# Patient Record
Sex: Female | Born: 1955 | Race: White | Hispanic: No | Smoking: Never smoker
Health system: Southern US, Community
[De-identification: ages and names within clinical notes are randomized; demographics above are authoritative.]

## PROBLEM LIST (undated history)

## (undated) DIAGNOSIS — C569 Malignant neoplasm of unspecified ovary: Secondary | ICD-10-CM

---

## 2016-04-25 ENCOUNTER — Encounter (HOSPITAL_COMMUNITY): Payer: Self-pay

## 2016-04-25 ENCOUNTER — Inpatient Hospital Stay (HOSPITAL_COMMUNITY)
Admission: EM | Admit: 2016-04-25 | Discharge: 2016-05-06 | DRG: 871 | Disposition: A | Discharge status: Skilled Nursing Facility | Payer: Medicaid Other | Attending: Internal Medicine | Admitting: Internal Medicine

## 2016-04-25 ENCOUNTER — Emergency Department (HOSPITAL_COMMUNITY): Payer: Medicaid Other

## 2016-04-25 DIAGNOSIS — R072 Precordial pain: Secondary | ICD-10-CM | POA: Diagnosis not present

## 2016-04-25 DIAGNOSIS — K7011 Alcoholic hepatitis with ascites: Secondary | ICD-10-CM | POA: Diagnosis not present

## 2016-04-25 DIAGNOSIS — D75839 Thrombocytosis, unspecified: Secondary | ICD-10-CM | POA: Diagnosis present

## 2016-04-25 DIAGNOSIS — E43 Unspecified severe protein-calorie malnutrition: Secondary | ICD-10-CM | POA: Diagnosis present

## 2016-04-25 DIAGNOSIS — E871 Hypo-osmolality and hyponatremia: Secondary | ICD-10-CM

## 2016-04-25 DIAGNOSIS — Z888 Allergy status to other drugs, medicaments and biological substances status: Secondary | ICD-10-CM

## 2016-04-25 DIAGNOSIS — J90 Pleural effusion, not elsewhere classified: Secondary | ICD-10-CM | POA: Diagnosis present

## 2016-04-25 DIAGNOSIS — C561 Malignant neoplasm of right ovary: Secondary | ICD-10-CM | POA: Diagnosis not present

## 2016-04-25 DIAGNOSIS — I248 Other forms of acute ischemic heart disease: Secondary | ICD-10-CM | POA: Diagnosis present

## 2016-04-25 DIAGNOSIS — R000 Tachycardia, unspecified: Secondary | ICD-10-CM

## 2016-04-25 DIAGNOSIS — Z833 Family history of diabetes mellitus: Secondary | ICD-10-CM

## 2016-04-25 DIAGNOSIS — D638 Anemia in other chronic diseases classified elsewhere: Secondary | ICD-10-CM | POA: Diagnosis present

## 2016-04-25 DIAGNOSIS — E440 Moderate protein-calorie malnutrition: Secondary | ICD-10-CM | POA: Insufficient documentation

## 2016-04-25 DIAGNOSIS — K652 Spontaneous bacterial peritonitis: Secondary | ICD-10-CM | POA: Diagnosis present

## 2016-04-25 DIAGNOSIS — I272 Pulmonary hypertension, unspecified: Secondary | ICD-10-CM | POA: Diagnosis present

## 2016-04-25 DIAGNOSIS — R0602 Shortness of breath: Secondary | ICD-10-CM

## 2016-04-25 DIAGNOSIS — E861 Hypovolemia: Secondary | ICD-10-CM | POA: Diagnosis present

## 2016-04-25 DIAGNOSIS — Z8249 Family history of ischemic heart disease and other diseases of the circulatory system: Secondary | ICD-10-CM

## 2016-04-25 DIAGNOSIS — I2510 Atherosclerotic heart disease of native coronary artery without angina pectoris: Secondary | ICD-10-CM | POA: Diagnosis present

## 2016-04-25 DIAGNOSIS — A4101 Sepsis due to Methicillin susceptible Staphylococcus aureus: Principal | ICD-10-CM | POA: Diagnosis present

## 2016-04-25 DIAGNOSIS — C569 Malignant neoplasm of unspecified ovary: Secondary | ICD-10-CM | POA: Diagnosis present

## 2016-04-25 DIAGNOSIS — C786 Secondary malignant neoplasm of retroperitoneum and peritoneum: Secondary | ICD-10-CM | POA: Diagnosis present

## 2016-04-25 DIAGNOSIS — D473 Essential (hemorrhagic) thrombocythemia: Secondary | ICD-10-CM | POA: Diagnosis present

## 2016-04-25 DIAGNOSIS — A412 Sepsis due to unspecified staphylococcus: Secondary | ICD-10-CM | POA: Diagnosis not present

## 2016-04-25 DIAGNOSIS — I471 Supraventricular tachycardia: Secondary | ICD-10-CM | POA: Diagnosis present

## 2016-04-25 DIAGNOSIS — K659 Peritonitis, unspecified: Secondary | ICD-10-CM | POA: Diagnosis not present

## 2016-04-25 DIAGNOSIS — R180 Malignant ascites: Secondary | ICD-10-CM | POA: Diagnosis present

## 2016-04-25 DIAGNOSIS — R188 Other ascites: Secondary | ICD-10-CM | POA: Diagnosis not present

## 2016-04-25 DIAGNOSIS — D509 Iron deficiency anemia, unspecified: Secondary | ICD-10-CM | POA: Diagnosis present

## 2016-04-25 DIAGNOSIS — R1900 Intra-abdominal and pelvic swelling, mass and lump, unspecified site: Secondary | ICD-10-CM | POA: Diagnosis not present

## 2016-04-25 DIAGNOSIS — N179 Acute kidney failure, unspecified: Secondary | ICD-10-CM | POA: Diagnosis present

## 2016-04-25 DIAGNOSIS — R609 Edema, unspecified: Secondary | ICD-10-CM

## 2016-04-25 DIAGNOSIS — Z803 Family history of malignant neoplasm of breast: Secondary | ICD-10-CM

## 2016-04-25 DIAGNOSIS — R971 Elevated cancer antigen 125 [CA 125]: Secondary | ICD-10-CM | POA: Diagnosis not present

## 2016-04-25 DIAGNOSIS — R7881 Bacteremia: Secondary | ICD-10-CM | POA: Diagnosis not present

## 2016-04-25 DIAGNOSIS — I5033 Acute on chronic diastolic (congestive) heart failure: Secondary | ICD-10-CM | POA: Diagnosis present

## 2016-04-25 DIAGNOSIS — E538 Deficiency of other specified B group vitamins: Secondary | ICD-10-CM | POA: Diagnosis present

## 2016-04-25 DIAGNOSIS — I4891 Unspecified atrial fibrillation: Secondary | ICD-10-CM | POA: Diagnosis not present

## 2016-04-25 DIAGNOSIS — Z780 Asymptomatic menopausal state: Secondary | ICD-10-CM

## 2016-04-25 DIAGNOSIS — R220 Localized swelling, mass and lump, head: Secondary | ICD-10-CM | POA: Diagnosis present

## 2016-04-25 DIAGNOSIS — A419 Sepsis, unspecified organism: Secondary | ICD-10-CM | POA: Diagnosis not present

## 2016-04-25 DIAGNOSIS — I480 Paroxysmal atrial fibrillation: Secondary | ICD-10-CM | POA: Diagnosis present

## 2016-04-25 DIAGNOSIS — R7989 Other specified abnormal findings of blood chemistry: Secondary | ICD-10-CM | POA: Diagnosis present

## 2016-04-25 DIAGNOSIS — E46 Unspecified protein-calorie malnutrition: Secondary | ICD-10-CM | POA: Diagnosis not present

## 2016-04-25 DIAGNOSIS — R1907 Generalized intra-abdominal and pelvic swelling, mass and lump: Secondary | ICD-10-CM | POA: Diagnosis not present

## 2016-04-25 DIAGNOSIS — C801 Malignant (primary) neoplasm, unspecified: Secondary | ICD-10-CM | POA: Diagnosis not present

## 2016-04-25 DIAGNOSIS — D649 Anemia, unspecified: Secondary | ICD-10-CM | POA: Diagnosis not present

## 2016-04-25 HISTORY — DX: Malignant neoplasm of unspecified ovary: C56.9

## 2016-04-25 LAB — CBC WITH DIFFERENTIAL/PLATELET
BAND NEUTROPHILS: 22 %
BLASTS: 0 %
Basophils Absolute: 0 10*3/uL (ref 0.0–0.1)
Basophils Relative: 0 %
Eosinophils Absolute: 0 10*3/uL (ref 0.0–0.7)
Eosinophils Relative: 0 %
HCT: 30.1 % — ABNORMAL LOW (ref 36.0–46.0)
HEMOGLOBIN: 8.2 g/dL — AB (ref 12.0–15.0)
LYMPHS ABS: 1.4 10*3/uL (ref 0.7–4.0)
Lymphocytes Relative: 7 %
MCH: 17.9 pg — ABNORMAL LOW (ref 26.0–34.0)
MCHC: 27.2 g/dL — AB (ref 30.0–36.0)
MCV: 65.7 fL — ABNORMAL LOW (ref 78.0–100.0)
METAMYELOCYTES PCT: 0 %
MONO ABS: 0.4 10*3/uL (ref 0.1–1.0)
MONOS PCT: 2 %
Myelocytes: 0 %
Neutro Abs: 17.6 10*3/uL — ABNORMAL HIGH (ref 1.7–7.7)
Neutrophils Relative %: 69 %
Other: 0 %
PLATELETS: 616 10*3/uL — AB (ref 150–400)
PROMYELOCYTES ABS: 0 %
RBC: 4.58 MIL/uL (ref 3.87–5.11)
RDW: 20.6 % — ABNORMAL HIGH (ref 11.5–15.5)
WBC MORPHOLOGY: INCREASED
WBC: 19.4 10*3/uL — ABNORMAL HIGH (ref 4.0–10.5)
nRBC: 0 /100 WBC

## 2016-04-25 LAB — I-STAT CHEM 8, ED
BUN: 9 mg/dL (ref 6–20)
Calcium, Ion: 1.07 mmol/L — ABNORMAL LOW (ref 1.15–1.40)
Chloride: 98 mmol/L — ABNORMAL LOW (ref 101–111)
Creatinine, Ser: 0.7 mg/dL (ref 0.44–1.00)
Glucose, Bld: 133 mg/dL — ABNORMAL HIGH (ref 65–99)
HCT: 30 % — ABNORMAL LOW (ref 36.0–46.0)
Hemoglobin: 10.2 g/dL — ABNORMAL LOW (ref 12.0–15.0)
Potassium: 4.2 mmol/L (ref 3.5–5.1)
SODIUM: 134 mmol/L — AB (ref 135–145)
TCO2: 26 mmol/L (ref 0–100)

## 2016-04-25 LAB — COMPREHENSIVE METABOLIC PANEL
ALBUMIN: 2 g/dL — AB (ref 3.5–5.0)
ALK PHOS: 91 U/L (ref 38–126)
ALT: 12 U/L — AB (ref 14–54)
AST: 24 U/L (ref 15–41)
Anion gap: 13 (ref 5–15)
BILIRUBIN TOTAL: 1.1 mg/dL (ref 0.3–1.2)
BUN: 8 mg/dL (ref 6–20)
CALCIUM: 7.9 mg/dL — AB (ref 8.9–10.3)
CO2: 22 mmol/L (ref 22–32)
Chloride: 99 mmol/L — ABNORMAL LOW (ref 101–111)
Creatinine, Ser: 0.71 mg/dL (ref 0.44–1.00)
GFR calc Af Amer: >60 mL/min (ref >60)
GFR calc non Af Amer: >60 mL/min (ref >60)
GLUCOSE: 130 mg/dL — AB (ref 65–99)
Potassium: 4.2 mmol/L (ref 3.5–5.1)
Sodium: 134 mmol/L — ABNORMAL LOW (ref 135–145)
TOTAL PROTEIN: 5.7 g/dL — AB (ref 6.5–8.1)

## 2016-04-25 LAB — PROTIME-INR
INR: 1.38
Prothrombin Time: 17.1 seconds — ABNORMAL HIGH (ref 11.4–15.2)

## 2016-04-25 LAB — I-STAT CG4 LACTIC ACID, ED
LACTIC ACID, VENOUS: 1.84 mmol/L (ref 0.5–1.9)
Lactic Acid, Venous: 3.28 mmol/L (ref 0.5–1.9)

## 2016-04-25 LAB — PROCALCITONIN: PROCALCITONIN: 0.53 ng/mL

## 2016-04-25 MED ORDER — ALBUMIN HUMAN 25 % IV SOLN
50.0000 g | Freq: Once | INTRAVENOUS | Status: AC | PRN
  Administered 2016-04-26: 50 g via INTRAVENOUS
  Filled 2016-04-25: qty 150
  Filled 2016-04-25: qty 50
  Filled 2016-04-25: qty 200

## 2016-04-25 MED ORDER — VANCOMYCIN HCL IN DEXTROSE 1-5 GM/200ML-% IV SOLN
1000.0000 mg | Freq: Three times a day (TID) | INTRAVENOUS | Status: DC
  Administered 2016-04-26: 1000 mg via INTRAVENOUS
  Filled 2016-04-25 (×3): qty 200

## 2016-04-25 MED ORDER — SODIUM CHLORIDE 0.9 % IV BOLUS (SEPSIS)
1000.0000 mL | Freq: Once | INTRAVENOUS | Status: AC
  Administered 2016-04-25: 1000 mL via INTRAVENOUS

## 2016-04-25 MED ORDER — ACETAMINOPHEN 650 MG RE SUPP: 650.0000 mg | Freq: Four times a day (QID) | RECTAL | Status: DC | PRN

## 2016-04-25 MED ORDER — SODIUM CHLORIDE 0.9 % IV SOLN
INTRAVENOUS | Status: AC
  Administered 2016-04-25: 21:00:00 via INTRAVENOUS

## 2016-04-25 MED ORDER — PIPERACILLIN-TAZOBACTAM 3.375 G IVPB 30 MIN
3.3750 g | Freq: Once | INTRAVENOUS | Status: AC
  Administered 2016-04-25: 3.375 g via INTRAVENOUS
  Filled 2016-04-25: qty 50

## 2016-04-25 MED ORDER — ONDANSETRON HCL 4 MG/2ML IJ SOLN: 4.0000 mg | Freq: Four times a day (QID) | INTRAMUSCULAR | Status: AC | PRN

## 2016-04-25 MED ORDER — SODIUM CHLORIDE 0.9% FLUSH
3.0000 mL | Freq: Two times a day (BID) | INTRAVENOUS | Status: DC
  Administered 2016-04-25 – 2016-05-02 (×11): 3 mL via INTRAVENOUS

## 2016-04-25 MED ORDER — PROMETHAZINE HCL 25 MG PO TABS
12.5000 mg | ORAL_TABLET | Freq: Four times a day (QID) | ORAL | Status: DC | PRN
  Administered 2016-04-27: 12.5 mg via ORAL
  Filled 2016-04-25: qty 1

## 2016-04-25 MED ORDER — ENOXAPARIN SODIUM 40 MG/0.4ML ~~LOC~~ SOLN
40.0000 mg | SUBCUTANEOUS | Status: DC
  Administered 2016-04-26 – 2016-04-30 (×4): 40 mg via SUBCUTANEOUS
  Filled 2016-04-25 (×5): qty 0.4

## 2016-04-25 MED ORDER — ACETAMINOPHEN 325 MG PO TABS: 650.0000 mg | ORAL_TABLET | Freq: Four times a day (QID) | ORAL | Status: DC | PRN

## 2016-04-25 MED ORDER — ONDANSETRON HCL 4 MG/2ML IJ SOLN
4.0000 mg | Freq: Once | INTRAMUSCULAR | Status: AC
  Administered 2016-04-25: 4 mg via INTRAVENOUS
  Filled 2016-04-25: qty 2

## 2016-04-25 MED ORDER — PIPERACILLIN-TAZOBACTAM 3.375 G IVPB
3.3750 g | Freq: Three times a day (TID) | INTRAVENOUS | Status: DC
  Administered 2016-04-26 – 2016-04-27 (×5): 3.375 g via INTRAVENOUS
  Filled 2016-04-25 (×8): qty 50

## 2016-04-25 MED ORDER — SODIUM CHLORIDE 0.9 % IV BOLUS (SEPSIS)
500.0000 mL | Freq: Once | INTRAVENOUS | Status: AC
  Administered 2016-04-25: 500 mL via INTRAVENOUS

## 2016-04-25 MED ORDER — FENTANYL CITRATE (PF) 100 MCG/2ML IJ SOLN
INTRAMUSCULAR | Status: AC
  Administered 2016-04-25: 20:00:00 via INTRAVENOUS
  Filled 2016-04-25: qty 2

## 2016-04-25 MED ORDER — ACETAMINOPHEN 650 MG RE SUPP
650.0000 mg | Freq: Once | RECTAL | Status: DC
  Filled 2016-04-25: qty 1

## 2016-04-25 MED ORDER — POLYETHYLENE GLYCOL 3350 17 G PO PACK: 17.0000 g | PACK | Freq: Every day | ORAL | Status: DC | PRN

## 2016-04-25 MED ORDER — HYDROMORPHONE HCL 1 MG/ML IJ SOLN
0.5000 mg | INTRAMUSCULAR | Status: AC | PRN
  Administered 2016-04-25 – 2016-04-29 (×13): 1 mg via INTRAVENOUS
  Filled 2016-04-25 (×14): qty 1

## 2016-04-25 MED ORDER — FENTANYL CITRATE (PF) 100 MCG/2ML IJ SOLN
50.0000 ug | INTRAMUSCULAR | Status: DC | PRN
  Administered 2016-04-25 (×2): 50 ug via INTRAVENOUS
  Filled 2016-04-25: qty 2

## 2016-04-25 MED ORDER — IOPAMIDOL (ISOVUE-300) INJECTION 61%
INTRAVENOUS | Status: AC
  Administered 2016-04-25: 100 mL
  Filled 2016-04-25: qty 100

## 2016-04-25 NOTE — ED Triage Notes (Signed)
Per EMS: Pt from [place], visiting here today. Pt seen by UC today for abdominal distention. Pt with large, tight abdomen upon arrival. Temp = 103.1 enroute, tachy 120's. MD at bedside. Pt denies emesis, pt with nausea upon arrival.

## 2016-04-25 NOTE — ED Notes (Signed)
Attempted report 

## 2016-04-25 NOTE — H&P (Signed)
History and Physical    Allison Haynes NLG:[id number] DOB: 13-Jan-1955 DOA: 04/25/2016  PCP: No primary care provider on file.   Patient coming from: Home  Chief Complaint: Severe abd pain and distension   HPI: Allison Haynes is a 61 y.o. female who denies any significant past medical history of acknowledging that she is rarely seen a physician, now presenting to the emergency department for evaluation of severe abdominal pain and distention. Initially, the patient reports that she was doing fine until the day prior, but later acknowledges that she has had some abdominal distention and intermittent pain present for close to a month. She reports that over the past couple days, the pain has become unbearable and her abdomen has become distended to the point where it is firm. She describes the pain as severe, constant, sharp, localized to the upper quadrants primarily with more moderate pain periumbilically, worse with movement or palpation, and with no alleviating factors identified. She had never experienced similar symptoms previously. She denies any melena or hematochezia and denies any chest pain, palpitations, dyspnea, or cough. She had not noted any fevers or chills at home, but reports a general malaise. She did not attempt any interventions prior to coming in.   ED Course: Upon arrival to the ED, patient is found to be febrile to 38.9 C, saturating 87% on room air, tachypneic in the low 30s, tachycardic to 120, and with stable blood pressure. EKG features a sinus tachycardia with rate 128 and minimal ST depression in the lateral leads. Chest x-ray is notable for low lung volumes and borderline cardiomegaly. Chemistry panel reveals a sodium of 134 and albumin of 2.0. CBC features a leukocytosis to 19,400 with increased band forms. CBC is also notable for a microcytic anemia with hemoglobin of 8.2 and MCV 65.7, and a thrombocytosis with platelets 616,000. Lactic acid was elevated to 3.28. CT of the abdomen and  pelvis was obtained and is concerning for ovarian malignancy with widespread intraperitoneal metastatic disease and malignant ascites. Also noted on CT is two-vessel coronary artery disease and a small left pleural effusion. Gynecology was consulted by the ED physician and advised for medical admission for pain control, noting that patient would be evaluated by gynecologic surgery. Blood and urine cultures were obtained in the ED, patient was treated with acetaminophen, fentanyl, and Zofran, given 3 L of normal saline, and started on empiric Zosyn. Tachycardia improved persisted. Her oxygenation normalized with 2 L/m via nasal cannula. Blood pressure is remained stable. 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  Review of Systems:  All other systems reviewed and apart from HPI, are negative.  Past Medical History:  Diagnosis Date  . Ovarian cancer ([place]) 04/25/2016    History reviewed. No pertinent surgical history.   reports that she has never smoked. She has never used smokeless tobacco. She reports that she does not drink alcohol or use drugs.  Allergies  Allergen Reactions  . Pseudoephedrine Other (See Comments)    "makes me loopy"    Family History  Problem Relation Age of Onset  . Diabetes Mellitus II Mother   . Hypertension Mother   . Diabetes Mellitus II Father   . Hypertension Father   . Breast cancer Maternal Aunt   . Breast cancer Paternal Aunt      Prior to Admission medications   Not on File  Physical Exam: Vitals:   04/25/16 2100 04/25/16 2115 04/25/16 2130 04/25/16 2200  BP: 125/72 122/73 100/85 113/65  Pulse:  (!) 105 (!) 104 (!) 102  Resp: (!) 35 (!) 33 (!) 32 (!) 35  Temp:    99.2 F (37.3 C)  TempSrc:    Oral  SpO2: 98% 99% 100% 100%  Weight:    90.8 kg (200 lb 2.8 oz)    Height:    5\' 5"  (1.651 m)      Constitutional: No respiratory distress. In obvious discomfort.  Eyes: PERTLA, lids and conjunctivae normal ENMT: Mucous membranes are dry. Posterior pharynx clear of any exudate or lesions.   Neck: normal, supple, no masses, no thyromegaly Respiratory: clear to auscultation bilaterally, no wheezing, no crackles. Normal respiratory effort.   Cardiovascular: Rate ~120 and regular. No LE edema. No significant JVD. Abdomen: Marked abdominal distension with fluid wave. Tender throughout, mostly upper quadrants. No peritoneal signs. Bowel sounds active.  Musculoskeletal: no clubbing / cyanosis. No joint deformity upper and lower extremities.   Skin: no significant rashes, lesions, ulcers. Warm, dry, well-perfused. Neurologic: CN 2-12 grossly intact. Sensation intact, DTR normal. Strength 5/5 in all 4 limbs.  Psychiatric: Alert and oriented x 3. Cooperative, calm.     Labs on Admission: I have personally reviewed following labs and imaging studies  CBC:  Recent Labs Lab 04/25/16 1745 04/25/16 1750  WBC 19.4*  --   NEUTROABS 17.6*  --   HGB 8.2* 10.2*  HCT 30.1* 30.0*  MCV 65.7*  --   PLT 616*  --    Basic Metabolic Panel:  Recent Labs Lab 04/25/16 1745 04/25/16 1750  NA 134* 134*  K 4.2 4.2  CL 99* 98*  CO2 22  --   GLUCOSE 130* 133*  BUN 8 9  CREATININE 0.71 0.70  CALCIUM 7.9*  --    GFR: Estimated Creatinine Clearance: 83.2 mL/min (by C-G formula based on SCr of 0.7 mg/dL). Liver Function Tests:  Recent Labs Lab 04/25/16 1745  AST 24  ALT 12*  ALKPHOS 91  BILITOT 1.1  PROT 5.7*  ALBUMIN 2.0*   No results for input(s): LIPASE, AMYLASE in the last 168 hours. No results for input(s): AMMONIA in the last 168 hours. Coagulation Profile:  Recent Labs Lab 04/25/16 1920  INR 1.38   Cardiac Enzymes: No results for input(s): CKTOTAL, CKMB, CKMBINDEX, TROPONINI in the last 168 hours. BNP (last 3 results) No results for  input(s): PROBNP in the last 8760 hours. HbA1C: No results for input(s): HGBA1C in the last 72 hours. CBG: No results for input(s): GLUCAP in the last 168 hours. Lipid Profile: No results for input(s): CHOL, HDL, LDLCALC, TRIG, CHOLHDL, LDLDIRECT in the last 72 hours. Thyroid Function Tests: No results for input(s): TSH, T4TOTAL, FREET4, T3FREE, THYROIDAB in the last 72 hours. Anemia Panel: No results for input(s): VITAMINB12, FOLATE, FERRITIN, TIBC, IRON, RETICCTPCT in the last 72 hours. Urine analysis: No results found for: COLORURINE, APPEARANCEUR, LABSPEC, PHURINE, GLUCOSEU, HGBUR, BILIRUBINUR, KETONESUR, PROTEINUR, UROBILINOGEN, NITRITE, LEUKOCYTESUR Sepsis Labs: @LABRCNTIP (procalcitonin:4,lacticidven:4) )No results found for this or any previous visit (from the past 240 hour(s)).   Radiological Exams on Admission: Ct Abdomen Pelvis W Contrast  Result Date: 04/25/2016 CLINICAL DATA:  61 year old female with history of abdominal distension and nausea. No recent vomiting. EXAM: CT ABDOMEN AND PELVIS WITH CONTRAST TECHNIQUE: Multidetector CT imaging of the abdomen and pelvis was performed using the standard protocol following bolus administration of intravenous contrast. CONTRAST:  1 ISOVUE-300  IOPAMIDOL (ISOVUE-300) INJECTION 61% COMPARISON:  No priors. FINDINGS: Lower chest: Air trapping right middle lobe. Small left pleural effusion. Atherosclerotic calcifications in the left anterior descending and left circumflex coronary arteries. Hepatobiliary: No suspicious cystic or solid hepatic lesions. No intra or extrahepatic biliary ductal dilatation. Small calcified gallstones lying dependently in the gallbladder. No findings to suggest an acute cholecystitis at this time. Pancreas: No pancreatic mass. No pancreatic ductal dilatation. No pancreatic or peripancreatic fluid or inflammatory changes. Spleen: Unremarkable. Adrenals/Urinary Tract: Right kidney and bilateral adrenal glands are normal in  appearance. 1.9 cm simple cyst in the interpolar region of the left kidney. No hydroureteronephrosis. Urinary bladder is normal in appearance. Stomach/Bowel: The appearance of the stomach is normal. There is no pathologic dilatation of small bowel or colon. Appendix is not confidently visualized. Vascular/Lymphatic: No significant atherosclerotic disease, aneurysm or dissection identified in the abdominal or pelvic vasculature. No lymphadenopathy noted in the abdomen or pelvis. Reproductive: Large complex mixed cystic and solid mass which emanates from one or both of the adnexal regions, highly concerning for ovarian neoplasm. This measures approximately 20.5 x 23.1 x 24.6 cm (coronal image 46 of series 6 and sagittal image 65 of series 7), and has mixed cystic and solid components, with some heterogeneous internal calcification. Uterus is also heterogeneous in appearance, with multiple densely calcified lesions, presumably multiple fibroids, largest of which measures 5.5 cm in the anterior aspect of the fundal region. Other: Large volume of ascites. Extensive nodular soft tissue thickening throughout the omentum, compatible with omental caking. Some of this extends through the umbilical region. No pneumoperitoneum. Musculoskeletal: There are no aggressive appearing lytic or blastic lesions noted in the visualized portions of the skeleton. IMPRESSION: 1. 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 Consultation with Gynecology is recommended. 2. Small left pleural effusion. 3. Aortic atherosclerosis, in addition to 2 vessel coronary artery disease. Please note that although the presence of coronary artery calcium documents the presence of coronary artery disease, the severity of this disease and any potential stenosis cannot be assessed on this non-gated CT examination. Assessment for potential risk factor modification, dietary  therapy or pharmacologic therapy may be warranted, if clinically indicated. 4. Cholelithiasis. Electronically Signed   By: Vinnie Langton M.D.   On: 04/25/2016 18:59   Dg Chest Port 1 View  Result Date: 04/25/2016 CLINICAL DATA:  Abdominal distension with fever EXAM: PORTABLE CHEST 1 VIEW COMPARISON:  None. FINDINGS: Low lung volumes. Non inclusion of the left CP angle. Borderline to mild cardiomegaly, exaggerated by low lung volume. Mild atherosclerosis. No focal consolidation. No pneumothorax. IMPRESSION: Low lung volumes, without infiltrate or edema. Borderline to mild cardiomegaly. Electronically Signed   By: Donavan Foil M.D.   On: 04/25/2016 17:54    EKG: Independently reviewed. Sinus tachycardia (rate 128), minimal ST-depression laterally.   Assessment/Plan  1. Sepsis  - Pt presents with abd pain and distension and is noted to have fever, tachycardia, tachypnea, leukocytosis, lactate elevation - CXR clear, urine remains pending  - Blood cultures were obtained in ED, 30 cc/kg NS given, and empiric treatment with Zosyn started  - This could certainly be secondary to the malignant process, but with her critical illness, plan to continue empiric broad-spectrums while awaiting UA and culture data   2. 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 Microcytic anemia  - Hgb is 8.2 on admission with MCV of 65.7  - No priors for comparison; pt is not pale and no active bleeding identified  - Type and screen will be performed and anemia panel sent   4. Thrombocytosis  - Platelets 616,000 on admission  - Likely reactive to the  malignant process    5. Hyponatremia - Serum sodium 134; she is hypovolemic  - Treated with 30 cc/kg NS in ED and continued on NS infusion   - Repeat chem panel in am    DVT prophylaxis: sq Lovenox  Code Status: Full  Family Communication: Aunt updated at bedside at pt's request Disposition Plan: Admit to SDU Consults called: Gynecologic surgery Admission status: Inpatient    Vianne Bulls, MD [facility] Pager [phone]  If 7PM-7AM, please contact night-coverage [email or web address] Password [facility]  04/25/2016, 10:42 PM

## 2016-04-25 NOTE — Progress Notes (Signed)
Pharmacy Antibiotic Note  Allison Haynes is a 61 y.o. female admitted on 04/25/2016 with sepsis - fever/abd distention concerning for intra-abd infection.    Plan: 1. Zosyn 3.375g IV every 8 hours (infused over 4 hours) 2. Vancomycin 1gm IV q8h 3. Will f/u renal function, micro data, and pt's clinical condition  Height: 5\' 5"  (165.1 cm) Weight: 200 lb 2.8 oz (90.8 kg) IBW/kg (Calculated) : 57  Temp (24hrs), Avg:99.9 F (37.7 C), Min:98.5 F (36.9 C), Max:102.1 F (38.9 C)   Recent Labs Lab 04/25/16 1745 04/25/16 1750 04/25/16 1751 04/25/16 2051  WBC 19.4*  --   --   --   CREATININE 0.71 0.70  --   --   LATICACIDVEN  --   --  3.28* 1.84    Estimated Creatinine Clearance: 83.2 mL/min (by C-G formula based on SCr of 0.7 mg/dL).    Allergies  Allergen Reactions  . Pseudoephedrine Other (See Comments)    "makes me loopy"    Antimicrobials this admission: Zosyn 4/21 >> Vanc 4/21 >>  Dose adjustments this admission:  Microbiology results: 4/21 BCx x2 >> 4/21 UCx >> 4/21 MRSA PCR >>  Thank you for allowing pharmacy to be a part of this patient's care.  Sherlon Handing, PharmD, BCPS Clinical pharmacist, pager [phone] 04/25/2016 10:41 PM

## 2016-04-25 NOTE — Plan of Care (Signed)
Problem: Education: Goal: Knowledge of [facility] Education information/materials will improve Outcome: Progressing Discussed plan of care with patient with some teach back displayed

## 2016-04-25 NOTE — ED Notes (Signed)
[facility] was contacted to activate Code Sepsis @ [phone]

## 2016-04-25 NOTE — ED Notes (Signed)
Patient complaining of lip pain and dryness. Vaseline guaze used to moisten lips.

## 2016-04-25 NOTE — Progress Notes (Addendum)
Pharmacy Antibiotic Note  Allison Haynes is a 61 y.o. female admitted on 04/25/2016 with abdominal distention concerning for intra-abdominal infection.  Pharmacy has been consulted for Zosyn dosing.  Plan: 1. Zosyn 3.375g IV every 8 hours (infused over 4 hours) 2. Pharmacy will sign off as no further dose adjustments expected at this time     No data recorded.  No results for input(s): WBC, CREATININE, LATICACIDVEN, VANCOTROUGH, VANCOPEAK, VANCORANDOM, GENTTROUGH, GENTPEAK, GENTRANDOM, TOBRATROUGH, TOBRAPEAK, TOBRARND, AMIKACINPEAK, AMIKACINTROU, AMIKACIN in the last 168 hours.  CrCl cannot be calculated (No order found.).    Allergies  Allergen Reactions  . Pseudoephedrine     "makes me loopy"    Antimicrobials this admission: Zosyn 4/21 >>  Dose adjustments this admission:  Microbiology results: 4/21 BCx >> 4/21 UCx >>  Thank you for allowing pharmacy to be a part of this patient's care.  Alycia Rossetti, PharmD, BCPS Clinical Pharmacist Pager: [phone] 04/25/2016 5:47 PM

## 2016-04-26 ENCOUNTER — Inpatient Hospital Stay (HOSPITAL_COMMUNITY): Payer: Medicaid Other

## 2016-04-26 DIAGNOSIS — A419 Sepsis, unspecified organism: Secondary | ICD-10-CM

## 2016-04-26 DIAGNOSIS — R180 Malignant ascites: Secondary | ICD-10-CM

## 2016-04-26 LAB — CBC WITH DIFFERENTIAL/PLATELET
BASOS PCT: 0 %
Basophils Absolute: 0 10*3/uL (ref 0.0–0.1)
Eosinophils Absolute: 0 10*3/uL (ref 0.0–0.7)
Eosinophils Relative: 0 %
HEMATOCRIT: 28.6 % — AB (ref 36.0–46.0)
Hemoglobin: 7.6 g/dL — ABNORMAL LOW (ref 12.0–15.0)
LYMPHS ABS: 0.8 10*3/uL (ref 0.7–4.0)
LYMPHS PCT: 3 %
MCH: 17.5 pg — AB (ref 26.0–34.0)
MCHC: 26.6 g/dL — ABNORMAL LOW (ref 30.0–36.0)
MCV: 65.7 fL — AB (ref 78.0–100.0)
MONOS PCT: 4 %
Monocytes Absolute: 1 10*3/uL (ref 0.1–1.0)
NEUTROS ABS: 23.4 10*3/uL — AB (ref 1.7–7.7)
Neutrophils Relative %: 93 %
Platelets: 457 10*3/uL — ABNORMAL HIGH (ref 150–400)
RBC: 4.35 MIL/uL (ref 3.87–5.11)
RDW: 19.9 % — ABNORMAL HIGH (ref 11.5–15.5)
WBC: 25.2 10*3/uL — ABNORMAL HIGH (ref 4.0–10.5)

## 2016-04-26 LAB — URINALYSIS, ROUTINE W REFLEX MICROSCOPIC
Bilirubin Urine: NEGATIVE
Glucose, UA: NEGATIVE mg/dL
Hgb urine dipstick: NEGATIVE
Ketones, ur: NEGATIVE mg/dL
LEUKOCYTES UA: NEGATIVE
NITRITE: NEGATIVE
Protein, ur: NEGATIVE mg/dL
pH: 5 (ref 5.0–8.0)

## 2016-04-26 LAB — PROTEIN, PLEURAL OR PERITONEAL FLUID: TOTAL PROTEIN, FLUID: 3.2 g/dL

## 2016-04-26 LAB — IRON AND TIBC
Iron: 5 ug/dL — ABNORMAL LOW (ref 28–170)
SATURATION RATIOS: 4 % — AB (ref 10.4–31.8)
TIBC: 134 ug/dL — ABNORMAL LOW (ref 250–450)
UIBC: 129 ug/dL

## 2016-04-26 LAB — BODY FLUID CELL COUNT WITH DIFFERENTIAL
Eos, Fluid: 0 %
Lymphs, Fluid: 3 %
Monocyte-Macrophage-Serous Fluid: 6 % — ABNORMAL LOW (ref 50–90)
NEUTROPHIL FLUID: 91 % — AB (ref 0–25)
Total Nucleated Cell Count, Fluid: 12625 cu mm — ABNORMAL HIGH (ref 0–1000)

## 2016-04-26 LAB — HEPATIC FUNCTION PANEL
ALBUMIN: 1.6 g/dL — AB (ref 3.5–5.0)
ALK PHOS: 67 U/L (ref 38–126)
ALT: 9 U/L — ABNORMAL LOW (ref 14–54)
AST: 14 U/L — AB (ref 15–41)
Bilirubin, Direct: 0.6 mg/dL — ABNORMAL HIGH (ref 0.1–0.5)
Indirect Bilirubin: 0.7 mg/dL (ref 0.3–0.9)
TOTAL PROTEIN: 5 g/dL — AB (ref 6.5–8.1)
Total Bilirubin: 1.3 mg/dL — ABNORMAL HIGH (ref 0.3–1.2)

## 2016-04-26 LAB — BASIC METABOLIC PANEL
ANION GAP: 10 (ref 5–15)
BUN: 12 mg/dL (ref 6–20)
CALCIUM: 7.3 mg/dL — AB (ref 8.9–10.3)
CHLORIDE: 105 mmol/L (ref 101–111)
CO2: 21 mmol/L — ABNORMAL LOW (ref 22–32)
CREATININE: 0.99 mg/dL (ref 0.44–1.00)
GFR calc non Af Amer: >60 mL/min (ref >60)
Glucose, Bld: 140 mg/dL — ABNORMAL HIGH (ref 65–99)
POTASSIUM: 4.2 mmol/L (ref 3.5–5.1)
Sodium: 136 mmol/L (ref 135–145)

## 2016-04-26 LAB — GLUCOSE, PLEURAL OR PERITONEAL FLUID: GLUCOSE FL: 79 mg/dL

## 2016-04-26 LAB — RETICULOCYTES
RBC.: 4.3 MIL/uL (ref 3.87–5.11)
RETIC CT PCT: 2.8 % (ref 0.4–3.1)
Retic Count, Absolute: 120.4 10*3/uL (ref 19.0–186.0)

## 2016-04-26 LAB — GRAM STAIN

## 2016-04-26 LAB — ALBUMIN, PLEURAL OR PERITONEAL FLUID: ALBUMIN FL: 1.3 g/dL

## 2016-04-26 LAB — VITAMIN B12: VITAMIN B 12: 175 pg/mL — AB (ref 180–914)

## 2016-04-26 LAB — FERRITIN: Ferritin: 98 ng/mL (ref 11–307)

## 2016-04-26 LAB — HIV ANTIBODY (ROUTINE TESTING W REFLEX): HIV Screen 4th Generation wRfx: NONREACTIVE

## 2016-04-26 LAB — MRSA PCR SCREENING: MRSA BY PCR: NEGATIVE

## 2016-04-26 LAB — ABO/RH: ABO/RH(D): O NEG

## 2016-04-26 LAB — LACTIC ACID, PLASMA: Lactic Acid, Venous: 1.9 mmol/L (ref 0.5–1.9)

## 2016-04-26 LAB — GLUCOSE, CAPILLARY: GLUCOSE-CAPILLARY: 141 mg/dL — AB (ref 65–99)

## 2016-04-26 LAB — FOLATE: Folate: 9 ng/mL (ref >5.9)

## 2016-04-26 LAB — LACTATE DEHYDROGENASE, PLEURAL OR PERITONEAL FLUID: LD, Fluid: 4811 U/L — ABNORMAL HIGH (ref 3–23)

## 2016-04-26 MED ORDER — CYANOCOBALAMIN 1000 MCG/ML IJ SOLN
1000.0000 ug | Freq: Every day | INTRAMUSCULAR | Status: DC
  Administered 2016-04-26 – 2016-05-02 (×6): 1000 ug via SUBCUTANEOUS
  Filled 2016-04-26 (×7): qty 1

## 2016-04-26 MED ORDER — VANCOMYCIN HCL 10 G IV SOLR
1250.0000 mg | Freq: Two times a day (BID) | INTRAVENOUS | Status: DC
  Administered 2016-04-26 – 2016-04-27 (×2): 1250 mg via INTRAVENOUS
  Filled 2016-04-26 (×2): qty 1250

## 2016-04-26 MED ORDER — WHITE PETROLATUM GEL
Status: AC
  Administered 2016-04-26: 08:00:00
  Filled 2016-04-26: qty 1

## 2016-04-26 MED ORDER — POLYETHYLENE GLYCOL 3350 17 G PO PACK
17.0000 g | PACK | Freq: Every day | ORAL | Status: DC
  Administered 2016-04-27 – 2016-05-06 (×7): 17 g via ORAL
  Filled 2016-04-26 (×11): qty 1

## 2016-04-26 MED ORDER — DILTIAZEM HCL-DEXTROSE 100-5 MG/100ML-% IV SOLN (PREMIX)
5.0000 mg/h | INTRAVENOUS | Status: DC
  Administered 2016-04-27: 5 mg/h via INTRAVENOUS
  Filled 2016-04-26: qty 100

## 2016-04-26 MED ORDER — DILTIAZEM LOAD VIA INFUSION
10.0000 mg | Freq: Once | INTRAVENOUS | Status: AC
  Administered 2016-04-27: 10 mg via INTRAVENOUS
  Filled 2016-04-26: qty 10

## 2016-04-26 MED ORDER — LIDOCAINE HCL (PF) 1 % IJ SOLN
INTRAMUSCULAR | Status: AC
  Filled 2016-04-26: qty 10

## 2016-04-26 MED ORDER — METOPROLOL TARTRATE 5 MG/5ML IV SOLN
5.0000 mg | Freq: Once | INTRAVENOUS | Status: AC
  Administered 2016-04-26: 5 mg via INTRAVENOUS
  Filled 2016-04-26: qty 5

## 2016-04-26 MED ORDER — SODIUM CHLORIDE 0.9 % IV BOLUS (SEPSIS)
500.0000 mL | Freq: Once | INTRAVENOUS | Status: AC
  Administered 2016-04-26: 500 mL via INTRAVENOUS

## 2016-04-26 NOTE — Progress Notes (Addendum)
CRITICAL VALUE ALERT  Critical value received:  Gram + cocci in clusters in pairs  Date of notification:  04/26/16   Time of notification:  .  Critical value read back:Yes.    Nurse who received alert:  Wayland Denis RN  MD notified (1st page): Ghimire paged  Time of first page:  Within 3 minutes results called to RN   MD notified (2nd page): Ghimire paged via text [place]  Time of second page: 18:41    Time MD responded:  Awaiting response  MD response: [id number]

## 2016-04-26 NOTE — Plan of Care (Signed)
Problem: Education: Goal: Knowledge of [facility] Education information/materials will improve Outcome: Progressing Patient oriented to unit and room. Updated patient on information regarding plan of care. Patient made aware of 0-10 pain rating scale and how to call for medication/assistance. Call light left within reach.  Problem: Safety: Goal: Ability to remain free from injury will improve Outcome: Progressing Call light within reach, bed alarm on, and patient not displaying impulsive behavior.  Problem: Pain Managment: Goal: General experience of comfort will improve Outcome: Progressing Patient's pain adequately controlled with PRN's at this time. Patient still refusing to move and is obvious pain with movement.  Problem: Skin Integrity: Goal: Risk for impaired skin integrity will decrease Outcome: Progressing Patient remains on left side- foam placed and try to encourage movement. Patient unwilling to change position at this time.  Problem: Activity: Goal: Risk for activity intolerance will decrease Outcome: Not Progressing Patient does not want to move. She chooses to stay on left side, without allowing [place] to reposition her.

## 2016-04-26 NOTE — Progress Notes (Signed)
Patient returned from [place] after paracentesis.  BP 108/74   Pulse (!) 109   Temp 98.7 F (37.1 C) (Oral)   Resp 17   Ht 5\' 5"  (1.651 m)   Wt 90.8 kg (200 lb 2.8 oz)   SpO2 96%   BMI 33.31 kg/m  2.5 Liters reported being removed.  IV not working upon return will get new IV and give Albumin

## 2016-04-26 NOTE — Plan of Care (Signed)
Problem: Pain Managment: Goal: General experience of comfort will improve Outcome: Progressing Patient refusing to turn d/t pain to off load weight on left hip.  Pain meds given PRN   Problem: Skin Integrity: Goal: Risk for impaired skin integrity will decrease Outcome: Not Progressing Patient refusing to turn off Left hip

## 2016-04-26 NOTE — Progress Notes (Addendum)
Patient ID: Allison Haynes, female   DOB: 1955/10/07, 61 y.o.   MRN: [id number] Request received for diag/therapeutic paracentesis to be done  today. Order confirmed with MD. See separate procedure note for details.

## 2016-04-26 NOTE — Procedures (Addendum)
Ultrasound-guided diagnostic and therapeutic paracentesis performed yielding 2.5 liters of turbid, bloody fluid. No immediate complications. A portion of the fluid was sent to the lab for preordered studies.

## 2016-04-26 NOTE — Progress Notes (Signed)
Pharmacy Antibiotic Note  Allison Haynes is a 61 y.o. female admitted on 04/25/2016 with sepsis - fever/abd distention concerning for intra-abd infection.  4/21 CT abdomen concerning for ovarian mass. Pharmacy has been consulted for vancomycin and zosyn dosing. WBC count is 25.2 and Tmax in the last 24 hours 102.1. SCr around 1 with CrCl~ 67 ml/min.   Plan: 1. Zosyn 3.375g IV every 8 hours (infused over 4 hours) 2. Decrease Vancomycin to 1250 mg every 12 hours given renal function 3. Monitor renal function, cultures, and signs/symptoms of infection. 4. Monitor further work-up  Height: 5\' 5"  (165.1 cm) Weight: 200 lb 2.8 oz (90.8 kg) IBW/kg (Calculated) : 57  Temp (24hrs), Avg:99.2 F (37.3 C), Min:97.7 F (36.5 C), Max:102.1 F (38.9 C)   Recent Labs Lab 04/25/16 1745 04/25/16 1750 04/25/16 1751 04/25/16 2051 04/25/16 2254 04/26/16 0330  WBC 19.4*  --   --   --   --  25.2*  CREATININE 0.71 0.70  --   --   --  0.99  LATICACIDVEN  --   --  3.28* 1.84 1.9  --     Estimated Creatinine Clearance: 67.3 mL/min (by C-G formula based on SCr of 0.99 mg/dL).    Allergies  Allergen Reactions  . Pseudoephedrine Other (See Comments)    "makes me loopy"    Antimicrobials this admission: Zosyn 4/21 >> Vanc 4/21 >>  Dose adjustments this admission:  Microbiology results: 4/21 BCx x2 >> 4/21 UCx >> 4/21 MRSA PCR >>Negative   Thank you for allowing pharmacy to be a part of this patient's care.  Uvaldo Bristle, PharmD PGY1 Pharmacy Resident Pager: [phone] 04/26/2016 9:03 AM

## 2016-04-26 NOTE — Progress Notes (Signed)
PROGRESS NOTE        PATIENT DETAILS Name: Allison Haynes Age: 61 y.o. Sex: female Date of Birth: 05-Jun-1955 Admit Date: 04/25/2016 Admitting Physician Vianne Bulls, MD PCP:No primary care provider on file.  Brief Narrative: Patient is a 61 y.o. female with no significant medical history-but has not seen a physician for the past number of years -presented to the ED on 4/21 with worsening abdominal pain and distention. 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 Patient was subsequently admitted for further evaluation and treatment.  Subjective: Continues to have significant discomfort from abdominal pain and distention.  Assessment/Plan: Sepsis: Although has sepsis pathophysiology on admission-apart from ascites/underlying ovarian mass-no other possible sources apparent on exam. Await culture data, continue empiric antimicrobial therapy. Await results of paracentesis.  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 In the meantime continue with supportive measures with as needed IV narcotics. Case discussed with GYN oncology on 4/22, they will formally evaluated on 4/23 and provide further recommendations.  Anemia: Anemia panel consistent with anemia of chronic disease-her iron levels are significantly low-we'll start iron supplementation. Will probably require erythropoietin injection at some point. Suspect significant drop in hemoglobin secondary to IV fluid resuscitation. Hold off on transfusion for now-and follow CBC  Thrombocytosis: Likely reactive-due to underlying malignancy.  DVT Prophylaxis: Prophylactic Lovenox   Code Status: Full code   Family Communication: Daughter at bedside  Disposition Plan: Remain inpatient-remain in SDU  Antimicrobial  agents: Anti-infectives    Start     Dose/Rate Route Frequency Ordered Stop   04/26/16 1700  vancomycin (VANCOCIN) 1,250 mg in sodium chloride 0.9 % 250 mL IVPB     1,250 mg 166.7 mL/hr over 90 Minutes Intravenous Every 12 hours 04/26/16 0908     04/26/16 0200  piperacillin-tazobactam (ZOSYN) IVPB 3.375 g     3.375 g 12.5 mL/hr over 240 Minutes Intravenous Every 8 hours 04/25/16 1827     04/25/16 2300  vancomycin (VANCOCIN) IVPB 1000 mg/200 mL premix  Status:  Discontinued     1,000 mg 200 mL/hr over 60 Minutes Intravenous Every 8 hours 04/25/16 2247 04/26/16 0908   04/25/16 1745  piperacillin-tazobactam (ZOSYN) IVPB 3.375 g     3.375 g 100 mL/hr over 30 Minutes Intravenous  Once 04/25/16 1738 04/25/16 1854      Procedures: None  CONSULTS:  GYN Oncology  Time spent: 25- minutes-Greater than 50% of this time was spent in counseling, explanation of diagnosis, planning of further management, and coordination of care.  MEDICATIONS: Scheduled Meds: . acetaminophen  650 mg Rectal Once  . cyanocobalamin  1,000 mcg Subcutaneous Daily  . enoxaparin (LOVENOX) injection  40 mg Subcutaneous Q24H  . polyethylene glycol  17 g Oral Daily  . sodium chloride flush  3 mL Intravenous Q12H   Continuous Infusions: . albumin human    . piperacillin-tazobactam (ZOSYN)  IV Stopped (04/26/16 0529)  . vancomycin     PRN Meds:.acetaminophen **OR** acetaminophen, albumin human, HYDROmorphone (DILAUDID) injection, promethazine   PHYSICAL EXAM: Vital signs: Vitals:   04/26/16 0500 04/26/16 0600 04/26/16 0700 04/26/16 0800  BP: 116/69 (!) 115/56 122/67 (!) 127/59  Pulse: (!) 107 94 100 (!) 101  Resp: Marland Kitchen)  26 18 19 18   Temp:    98.9 F (37.2 C)  TempSrc:    Oral  SpO2: 98% 100% 100% 100%  Weight:      Height:       Filed Weights   04/25/16 1938 04/25/16 2200  Weight: 100 kg (220 lb 7.4 oz) 90.8 kg (200 lb 2.8 oz)   Body mass index is 33.31 kg/m.   General appearance :Awake, alert,In  mild to moderate distress due to abdominal pain and distention. Eyes:, pupils equally reactive to light and accomodation,no scleral icterus. HEENT: Atraumatic and Normocephalic Neck: supple, no JVD. No cervical lymphadenopathy.  Resp:Good air entry bilaterally, no added sounds  CVS: S1 S2 regular, no murmurs.  GI: Bowel sounds present, significant abdominal distention-diffusely tender all over. Difficult exam patient does not allow exam due to pain.  Extremities: B/L Lower Ext shows + edema, both legs are warm to touch Neurology:  speech clear,Non focal, sensation is grossly intact. Musculoskeletal:No digital cyanosis Skin:No Rash, warm and dry Wounds:N/A  I have personally reviewed following labs and imaging studies  LABORATORY DATA: CBC:  Recent Labs Lab 04/25/16 1745 04/25/16 1750 04/26/16 0330  WBC 19.4*  --  25.2*  NEUTROABS 17.6*  --  23.4*  HGB 8.2* 10.2* 7.6*  HCT 30.1* 30.0* 28.6*  MCV 65.7*  --  65.7*  PLT 616*  --  457*    Basic Metabolic Panel:  Recent Labs Lab 04/25/16 1745 04/25/16 1750 04/26/16 0330  NA 134* 134* 136  K 4.2 4.2 4.2  CL 99* 98* 105  CO2 22  --  21*  GLUCOSE 130* 133* 140*  BUN 8 9 12   CREATININE 0.71 0.70 0.99  CALCIUM 7.9*  --  7.3*    GFR: Estimated Creatinine Clearance: 67.3 mL/min (by C-G formula based on SCr of 0.99 mg/dL).  Liver Function Tests:  Recent Labs Lab 04/25/16 1745 04/26/16 0841  AST 24 14*  ALT 12* 9*  ALKPHOS 91 67  BILITOT 1.1 1.3*  PROT 5.7* 5.0*  ALBUMIN 2.0* 1.6*   No results for input(s): LIPASE, AMYLASE in the last 168 hours. No results for input(s): AMMONIA in the last 168 hours.  Coagulation Profile:  Recent Labs Lab 04/25/16 1920  INR 1.38    Cardiac Enzymes: No results for input(s): CKTOTAL, CKMB, CKMBINDEX, TROPONINI in the last 168 hours.  BNP (last 3 results) No results for input(s): PROBNP in the last 8760 hours.  HbA1C: No results for input(s): HGBA1C in the last 72  hours.  CBG:  Recent Labs Lab 04/26/16 0804  GLUCAP 141*    Lipid Profile: No results for input(s): CHOL, HDL, LDLCALC, TRIG, CHOLHDL, LDLDIRECT in the last 72 hours.  Thyroid Function Tests: No results for input(s): TSH, T4TOTAL, FREET4, T3FREE, THYROIDAB in the last 72 hours.  Anemia Panel:  Recent Labs  04/25/16 2254  VITAMINB12 175*  FOLATE 9.0  FERRITIN 98  TIBC 134*  IRON 5*  RETICCTPCT 2.8    Urine analysis:    Component Value Date/Time   COLORURINE AMBER (A) 04/26/2016 0530   APPEARANCEUR CLEAR 04/26/2016 0530   LABSPEC >1.046 (H) 04/26/2016 0530   PHURINE 5.0 04/26/2016 0530   GLUCOSEU NEGATIVE 04/26/2016 0530   HGBUR NEGATIVE 04/26/2016 0530   BILIRUBINUR NEGATIVE 04/26/2016 0530   KETONESUR NEGATIVE 04/26/2016 0530   PROTEINUR NEGATIVE 04/26/2016 0530   NITRITE NEGATIVE 04/26/2016 0530   LEUKOCYTESUR NEGATIVE 04/26/2016 0530    Sepsis Labs: Lactic Acid, Venous    Component Value  Date/Time   LATICACIDVEN 1.9 04/25/2016 2254    MICROBIOLOGY: Recent Results (from the past 240 hour(s))  MRSA PCR Screening     Status: None   Collection Time: 04/25/16 10:25 PM  Result Value Ref Range Status   MRSA by PCR NEGATIVE NEGATIVE Final    Comment:        The GeneXpert MRSA Assay (FDA approved for NASAL specimens only), is one component of a comprehensive MRSA colonization surveillance program. It is not intended to diagnose MRSA infection nor to guide or monitor treatment for MRSA infections.     RADIOLOGY STUDIES/RESULTS: Ct Abdomen Pelvis W Contrast  Result Date: 04/25/2016 CLINICAL DATA:  61 year old female with history of abdominal distension and nausea. No recent vomiting. EXAM: CT ABDOMEN AND PELVIS WITH CONTRAST TECHNIQUE: Multidetector CT imaging of the abdomen and pelvis was performed using the standard protocol following bolus administration of intravenous contrast. CONTRAST:  1 ISOVUE-300 IOPAMIDOL (ISOVUE-300) INJECTION 61%  COMPARISON:  No priors. FINDINGS: Lower chest: Air trapping right middle lobe. Small left pleural effusion. Atherosclerotic calcifications in the left anterior descending and left circumflex coronary arteries. Hepatobiliary: No suspicious cystic or solid hepatic lesions. No intra or extrahepatic biliary ductal dilatation. Small calcified gallstones lying dependently in the gallbladder. No findings to suggest an acute cholecystitis at this time. Pancreas: No pancreatic mass. No pancreatic ductal dilatation. No pancreatic or peripancreatic fluid or inflammatory changes. Spleen: Unremarkable. Adrenals/Urinary Tract: Right kidney and bilateral adrenal glands are normal in appearance. 1.9 cm simple cyst in the interpolar region of the left kidney. No hydroureteronephrosis. Urinary bladder is normal in appearance. Stomach/Bowel: The appearance of the stomach is normal. There is no pathologic dilatation of small bowel or colon. Appendix is not confidently visualized. Vascular/Lymphatic: No significant atherosclerotic disease, aneurysm or dissection identified in the abdominal or pelvic vasculature. No lymphadenopathy noted in the abdomen or pelvis. Reproductive: Large complex mixed cystic and solid mass which emanates from one or both of the adnexal regions, highly concerning for ovarian neoplasm. This measures approximately 20.5 x 23.1 x 24.6 cm (coronal image 46 of series 6 and sagittal image 65 of series 7), and has mixed cystic and solid components, with some heterogeneous internal calcification. Uterus is also heterogeneous in appearance, with multiple densely calcified lesions, presumably multiple fibroids, largest of which measures 5.5 cm in the anterior aspect of the fundal region. Other: Large volume of ascites. Extensive nodular soft tissue thickening throughout the omentum, compatible with omental caking. Some of this extends through the umbilical region. No pneumoperitoneum. Musculoskeletal: There are no  aggressive appearing lytic or blastic lesions noted in the visualized portions of the skeleton. IMPRESSION: 1. 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 Consultation with Gynecology is recommended. 2. Small left pleural effusion. 3. Aortic atherosclerosis, in addition to 2 vessel coronary artery disease. Please note that although the presence of coronary artery calcium documents the presence of coronary artery disease, the severity of this disease and any potential stenosis cannot be assessed on this non-gated CT examination. Assessment for potential risk factor modification, dietary therapy or pharmacologic therapy may be warranted, if clinically indicated. 4. Cholelithiasis. Electronically Signed   By: Vinnie Langton M.D.   On: 04/25/2016 18:59   Dg Chest Port 1 View  Result Date: 04/25/2016 CLINICAL DATA:  Abdominal distension with fever EXAM: PORTABLE CHEST 1 VIEW COMPARISON:  None. FINDINGS: Low lung volumes. Non inclusion of the left CP angle. Borderline to mild cardiomegaly, exaggerated  by low lung volume. Mild atherosclerosis. No focal consolidation. No pneumothorax. IMPRESSION: Low lung volumes, without infiltrate or edema. Borderline to mild cardiomegaly. Electronically Signed   By: Donavan Foil M.D.   On: 04/25/2016 17:54     LOS: 1 day   Oren Binet, MD  [facility] Pager:[phone]  If 7PM-7AM, please contact night-coverage [email or web address] Password [facility] 04/26/2016, 10:55 AM

## 2016-04-26 NOTE — ED Provider Notes (Signed)
[facility] DEPT Provider Note   CSN: [id number] Arrival date & time: 04/25/16  1729     History   Chief Complaint Chief Complaint  Patient presents with  . Abdominal Pain    HPI Allison Haynes is a 61 y.o. female.  Patient with no known medical problems no primary care physician presents with worsening abdominal pain distention for the past 2 days. Patient has noticed a fullness the past week however pain was significant today. Decreased appetite and mild vomiting. Patient denies blood in the stools. Patient denies any abdominal surgery history or bowel obstruction history. Fever started today. Pain fairly constant      Past Medical History:  Diagnosis Date  . Ovarian cancer ([place]) 04/25/2016    Patient Active Problem List   Diagnosis Date Noted  . Sepsis ([place]) 04/25/2016  . Ovarian cancer ([place]) 04/25/2016  . Malignant ascites 04/25/2016  . Microcytic anemia 04/25/2016  . Thrombocytosis ([place]) 04/25/2016  . Hyponatremia 04/25/2016    History reviewed. No pertinent surgical history.  OB History    No data available       Home Medications    Prior to Admission medications   Not on File    Family History Family History  Problem Relation Age of Onset  . Diabetes Mellitus II Mother   . Hypertension Mother   . Diabetes Mellitus II Father   . Hypertension Father   . Breast cancer Maternal Aunt   . Breast cancer Paternal Aunt     Social History Social History  Substance Use Topics  . Smoking status: Never Smoker  . Smokeless tobacco: Never Used  . Alcohol use No     Allergies   Pseudoephedrine   Review of Systems Review of Systems  Constitutional: Negative for chills and fever.  HENT: Negative for congestion.   Eyes: Negative for visual disturbance.  Respiratory: Negative for shortness of breath.   Cardiovascular: Negative for chest pain.  Gastrointestinal: Positive for abdominal pain, nausea and vomiting.  Genitourinary: Negative for dysuria  and flank pain.  Musculoskeletal: Negative for back pain, neck pain and neck stiffness.  Skin: Negative for rash.  Neurological: Negative for light-headedness and headaches.     Physical Exam Updated Vital Signs BP (!) 105/57 (BP Location: Right Arm)   Pulse 88   Temp 98.6 F (37 C) (Oral)   Resp 20   Ht 5\' 5"  (1.651 m)   Wt 200 lb 2.8 oz (90.8 kg)   SpO2 100%   BMI 33.31 kg/m   Physical Exam  Constitutional: She is oriented to person, place, and time. She appears well-developed.  HENT:  Head: Normocephalic and atraumatic.  Dry mm  Eyes: Conjunctivae are normal. Right eye exhibits no discharge. Left eye exhibits no discharge.  Neck: Normal range of motion. Neck supple. No tracheal deviation present.  Cardiovascular: Regular rhythm.   Pulmonary/Chest: Effort normal and breath sounds normal.  Abdominal: She exhibits distension and mass. There is tenderness. There is guarding.  Musculoskeletal: She exhibits edema.  Neurological: She is alert and oriented to person, place, and time.  Skin: Skin is warm. No rash noted. There is pallor.  Psychiatric: She has a normal mood and affect.  Nursing note and vitals reviewed.    ED Treatments / Results  Labs (all labs ordered are listed, but only abnormal results are displayed) Labs Reviewed  COMPREHENSIVE METABOLIC PANEL - Abnormal; Notable for the following:       Result Value   Sodium 134 (*)  Chloride 99 (*)    Glucose, Bld 130 (*)    Calcium 7.9 (*)    Total Protein 5.7 (*)    Albumin 2.0 (*)    ALT 12 (*)    All other components within normal limits  CBC WITH DIFFERENTIAL/PLATELET - Abnormal; Notable for the following:    WBC 19.4 (*)    Hemoglobin 8.2 (*)    HCT 30.1 (*)    MCV 65.7 (*)    MCH 17.9 (*)    MCHC 27.2 (*)    RDW 20.6 (*)    Platelets 616 (*)    Neutro Abs 17.6 (*)    All other components within normal limits  PROTIME-INR - Abnormal; Notable for the following:    Prothrombin Time 17.1 (*)     All other components within normal limits  I-STAT CG4 LACTIC ACID, ED - Abnormal; Notable for the following:    Lactic Acid, Venous 3.28 (*)    All other components within normal limits  I-STAT CHEM 8, ED - Abnormal; Notable for the following:    Sodium 134 (*)    Chloride 98 (*)    Glucose, Bld 133 (*)    Calcium, Ion 1.07 (*)    Hemoglobin 10.2 (*)    HCT 30.0 (*)    All other components within normal limits  CULTURE, BLOOD (ROUTINE X 2)  CULTURE, BLOOD (ROUTINE X 2)  URINE CULTURE  MRSA PCR SCREENING  PROCALCITONIN  URINALYSIS, ROUTINE W REFLEX MICROSCOPIC  HIV ANTIBODY (ROUTINE TESTING)  CBC WITH DIFFERENTIAL/PLATELET  LACTIC ACID, PLASMA  BASIC METABOLIC PANEL  VITAMIN P61  FOLATE  IRON AND TIBC  FERRITIN  RETICULOCYTES  I-STAT CG4 LACTIC ACID, ED  TYPE AND SCREEN  ABO/RH    EKG  EKG Interpretation None       Radiology Ct Abdomen Pelvis W Contrast  Result Date: 04/25/2016 CLINICAL DATA:  61 year old female with history of abdominal distension and nausea. No recent vomiting. EXAM: CT ABDOMEN AND PELVIS WITH CONTRAST TECHNIQUE: Multidetector CT imaging of the abdomen and pelvis was performed using the standard protocol following bolus administration of intravenous contrast. CONTRAST:  1 ISOVUE-300 IOPAMIDOL (ISOVUE-300) INJECTION 61% COMPARISON:  No priors. FINDINGS: Lower chest: Air trapping right middle lobe. Small left pleural effusion. Atherosclerotic calcifications in the left anterior descending and left circumflex coronary arteries. Hepatobiliary: No suspicious cystic or solid hepatic lesions. No intra or extrahepatic biliary ductal dilatation. Small calcified gallstones lying dependently in the gallbladder. No findings to suggest an acute cholecystitis at this time. Pancreas: No pancreatic mass. No pancreatic ductal dilatation. No pancreatic or peripancreatic fluid or inflammatory changes. Spleen: Unremarkable. Adrenals/Urinary Tract: Right kidney and bilateral  adrenal glands are normal in appearance. 1.9 cm simple cyst in the interpolar region of the left kidney. No hydroureteronephrosis. Urinary bladder is normal in appearance. Stomach/Bowel: The appearance of the stomach is normal. There is no pathologic dilatation of small bowel or colon. Appendix is not confidently visualized. Vascular/Lymphatic: No significant atherosclerotic disease, aneurysm or dissection identified in the abdominal or pelvic vasculature. No lymphadenopathy noted in the abdomen or pelvis. Reproductive: Large complex mixed cystic and solid mass which emanates from one or both of the adnexal regions, highly concerning for ovarian neoplasm. This measures approximately 20.5 x 23.1 x 24.6 cm (coronal image 46 of series 6 and sagittal image 65 of series 7), and has mixed cystic and solid components, with some heterogeneous internal calcification. Uterus is also heterogeneous in appearance, with multiple densely calcified lesions,  presumably multiple fibroids, largest of which measures 5.5 cm in the anterior aspect of the fundal region. Other: Large volume of ascites. Extensive nodular soft tissue thickening throughout the omentum, compatible with omental caking. Some of this extends through the umbilical region. No pneumoperitoneum. Musculoskeletal: There are no aggressive appearing lytic or blastic lesions noted in the visualized portions of the skeleton. IMPRESSION: 1. 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 Consultation with Gynecology is recommended. 2. Small left pleural effusion. 3. Aortic atherosclerosis, in addition to 2 vessel coronary artery disease. Please note that although the presence of coronary artery calcium documents the presence of coronary artery disease, the severity of this disease and any potential stenosis cannot be assessed on this non-gated CT examination. Assessment for potential risk  factor modification, dietary therapy or pharmacologic therapy may be warranted, if clinically indicated. 4. Cholelithiasis. Electronically Signed   By: Vinnie Langton M.D.   On: 04/25/2016 18:59   Dg Chest Port 1 View  Result Date: 04/25/2016 CLINICAL DATA:  Abdominal distension with fever EXAM: PORTABLE CHEST 1 VIEW COMPARISON:  None. FINDINGS: Low lung volumes. Non inclusion of the left CP angle. Borderline to mild cardiomegaly, exaggerated by low lung volume. Mild atherosclerosis. No focal consolidation. No pneumothorax. IMPRESSION: Low lung volumes, without infiltrate or edema. Borderline to mild cardiomegaly. Electronically Signed   By: Donavan Foil M.D.   On: 04/25/2016 17:54    Procedures Procedures (including critical care time) CRITICAL CARE Performed by: Mariea Clonts   Total critical care time: 35 minutes  Critical care time was exclusive of separately billable procedures and treating other patients.  Critical care was necessary to treat or prevent imminent or life-threatening deterioration.  Critical care was time spent personally by me on the following activities: development of treatment plan with patient and/or surrogate as well as nursing, discussions with consultants, evaluation of patient's response to treatment, examination of patient, obtaining history from patient or surrogate, ordering and performing treatments and interventions, ordering and review of laboratory studies, ordering and review of radiographic studies, pulse oximetry and re-evaluation of patient's condition.  Medications Ordered in ED Medications  piperacillin-tazobactam (ZOSYN) IVPB 3.375 g (not administered)  acetaminophen (TYLENOL) suppository 650 mg (0 mg Rectal Hold 04/25/16 1955)  0.9 %  sodium chloride infusion ( Intravenous New Bag/Given 04/25/16 2050)  ondansetron (ZOFRAN) injection 4 mg (not administered)  HYDROmorphone (DILAUDID) injection 0.5-1 mg (1 mg Intravenous Given 04/25/16 2201)    enoxaparin (LOVENOX) injection 40 mg (not administered)  sodium chloride flush (NS) 0.9 % injection 3 mL (3 mLs Intravenous Given 04/25/16 2231)  acetaminophen (TYLENOL) tablet 650 mg (not administered)    Or  acetaminophen (TYLENOL) suppository 650 mg (not administered)  polyethylene glycol (MIRALAX / GLYCOLAX) packet 17 g (not administered)  promethazine (PHENERGAN) tablet 12.5 mg (not administered)  albumin human 25 % solution 50 g (not administered)  vancomycin (VANCOCIN) IVPB 1000 mg/200 mL premix (1,000 mg Intravenous New Bag/Given 04/25/16 2345)  sodium chloride 0.9 % bolus 1,000 mL (0 mLs Intravenous Stopped 04/25/16 1854)    And  sodium chloride 0.9 % bolus 1,000 mL (0 mLs Intravenous Stopped 04/25/16 1946)    And  sodium chloride 0.9 % bolus 1,000 mL (0 mLs Intravenous Stopped 04/25/16 2049)  piperacillin-tazobactam (ZOSYN) IVPB 3.375 g (0 g Intravenous Stopped 04/25/16 1854)  ondansetron (ZOFRAN) injection 4 mg (4 mg Intravenous Given 04/25/16 1759)  iopamidol (ISOVUE-300) 61 % injection (100 mLs  Contrast  Given 04/25/16 1829)  fentaNYL (SUBLIMAZE) 100 MCG/2ML injection ( Intravenous Given by Other 04/25/16 2000)  sodium chloride 0.9 % bolus 500 mL (500 mLs Intravenous New Bag/Given 04/25/16 2258)     Initial Impression / Assessment and Plan / ED Course  I have reviewed the triage vital signs and the nursing notes.  Pertinent labs & imaging results that were available during my care of the patient were reviewed by me and considered in my medical decision making (see chart for details).    Patient presents with concern for acute abdomen with significant diffuse tenderness distention, fever. Sepsis workup initiated with broad antibiotics, 30 mL/kg IV fluids. Discussed with nursing.  Discussed with CT department for emergent CT scan.  CT results show unfortunately significant mass with metastasis and fluid. Discussed case with on-call gynecology recommends hospitalist admission and 9"  consult. Discussed case with hospitalist for admission.  The patients results and plan were reviewed and discussed.   Any x-rays performed were independently reviewed by myself.   Differential diagnosis were considered with the presenting HPI.  Medications  piperacillin-tazobactam (ZOSYN) IVPB 3.375 g (not administered)  acetaminophen (TYLENOL) suppository 650 mg (0 mg Rectal Hold 04/25/16 1955)  0.9 %  sodium chloride infusion ( Intravenous New Bag/Given 04/25/16 2050)  ondansetron (ZOFRAN) injection 4 mg (not administered)  HYDROmorphone (DILAUDID) injection 0.5-1 mg (1 mg Intravenous Given 04/25/16 2201)  enoxaparin (LOVENOX) injection 40 mg (not administered)  sodium chloride flush (NS) 0.9 % injection 3 mL (3 mLs Intravenous Given 04/25/16 2231)  acetaminophen (TYLENOL) tablet 650 mg (not administered)    Or  acetaminophen (TYLENOL) suppository 650 mg (not administered)  polyethylene glycol (MIRALAX / GLYCOLAX) packet 17 g (not administered)  promethazine (PHENERGAN) tablet 12.5 mg (not administered)  albumin human 25 % solution 50 g (not administered)  vancomycin (VANCOCIN) IVPB 1000 mg/200 mL premix (0 mg Intravenous Paused 04/25/16 2346)  sodium chloride 0.9 % bolus 1,000 mL (0 mLs Intravenous Stopped 04/25/16 1854)    And  sodium chloride 0.9 % bolus 1,000 mL (0 mLs Intravenous Stopped 04/25/16 1946)    And  sodium chloride 0.9 % bolus 1,000 mL (0 mLs Intravenous Stopped 04/25/16 2049)  piperacillin-tazobactam (ZOSYN) IVPB 3.375 g (0 g Intravenous Stopped 04/25/16 1854)  ondansetron (ZOFRAN) injection 4 mg (4 mg Intravenous Given 04/25/16 1759)  iopamidol (ISOVUE-300) 61 % injection (100 mLs  Contrast Given 04/25/16 1829)  fentaNYL (SUBLIMAZE) 100 MCG/2ML injection ( Intravenous Given by Other 04/25/16 2000)  sodium chloride 0.9 % bolus 500 mL (500 mLs Intravenous New Bag/Given 04/25/16 2258)    Vitals:   04/25/16 2115 04/25/16 2130 04/25/16 2200 04/25/16 2327  BP: 122/73 100/85  113/65 (!) 105/57  Pulse: (!) 105 (!) 104 (!) 102 88  Resp: (!) 33 (!) 32 (!) 35 20  Temp:   99.2 F (37.3 C) 98.6 F (37 C)  TempSrc:   Oral Oral  SpO2: 99% 100% 100% 100%  Weight:   200 lb 2.8 oz (90.8 kg)   Height:   5\' 5"  (1.651 m)     Final diagnoses:  Malignant ascites    Admission/ observation were discussed with the admitting physician, patient and/or family and they are comfortable with the plan.    Final Clinical Impressions(s) / ED Diagnoses   Final diagnoses:  Malignant ascites    New Prescriptions There are no discharge medications for this patient.    Elnora Morrison, MD 04/26/16 [id number]

## 2016-04-27 ENCOUNTER — Inpatient Hospital Stay (HOSPITAL_COMMUNITY): Payer: Medicaid Other

## 2016-04-27 DIAGNOSIS — D509 Iron deficiency anemia, unspecified: Secondary | ICD-10-CM

## 2016-04-27 DIAGNOSIS — E43 Unspecified severe protein-calorie malnutrition: Secondary | ICD-10-CM

## 2016-04-27 DIAGNOSIS — R188 Other ascites: Secondary | ICD-10-CM

## 2016-04-27 DIAGNOSIS — E440 Moderate protein-calorie malnutrition: Secondary | ICD-10-CM | POA: Insufficient documentation

## 2016-04-27 DIAGNOSIS — D473 Essential (hemorrhagic) thrombocythemia: Secondary | ICD-10-CM

## 2016-04-27 DIAGNOSIS — N179 Acute kidney failure, unspecified: Secondary | ICD-10-CM

## 2016-04-27 DIAGNOSIS — I4891 Unspecified atrial fibrillation: Secondary | ICD-10-CM

## 2016-04-27 DIAGNOSIS — R1900 Intra-abdominal and pelvic swelling, mass and lump, unspecified site: Secondary | ICD-10-CM

## 2016-04-27 DIAGNOSIS — K659 Peritonitis, unspecified: Secondary | ICD-10-CM

## 2016-04-27 DIAGNOSIS — C569 Malignant neoplasm of unspecified ovary: Secondary | ICD-10-CM

## 2016-04-27 DIAGNOSIS — R072 Precordial pain: Secondary | ICD-10-CM

## 2016-04-27 LAB — CBC WITH DIFFERENTIAL/PLATELET
BASOS ABS: 0 10*3/uL (ref 0.0–0.1)
Basophils Relative: 0 %
EOS ABS: 0 10*3/uL (ref 0.0–0.7)
Eosinophils Relative: 0 %
HCT: 26.1 % — ABNORMAL LOW (ref 36.0–46.0)
HEMOGLOBIN: 7.1 g/dL — AB (ref 12.0–15.0)
Lymphocytes Relative: 2 %
Lymphs Abs: 0.7 10*3/uL (ref 0.7–4.0)
MCH: 18.1 pg — ABNORMAL LOW (ref 26.0–34.0)
MCHC: 27.2 g/dL — ABNORMAL LOW (ref 30.0–36.0)
MCV: 66.4 fL — ABNORMAL LOW (ref 78.0–100.0)
MONOS PCT: 3 %
Monocytes Absolute: 1 10*3/uL (ref 0.1–1.0)
NEUTROS PCT: 95 %
Neutro Abs: 32.1 10*3/uL — ABNORMAL HIGH (ref 1.7–7.7)
Platelets: 427 10*3/uL — ABNORMAL HIGH (ref 150–400)
RBC: 3.93 MIL/uL (ref 3.87–5.11)
RDW: 20.3 % — ABNORMAL HIGH (ref 11.5–15.5)
WBC: 33.8 10*3/uL — ABNORMAL HIGH (ref 4.0–10.5)

## 2016-04-27 LAB — LACTIC ACID, PLASMA: LACTIC ACID, VENOUS: 1.4 mmol/L (ref 0.5–1.9)

## 2016-04-27 LAB — BLOOD CULTURE ID PANEL (REFLEXED)
Acinetobacter baumannii: NOT DETECTED
CANDIDA GLABRATA: NOT DETECTED
CANDIDA KRUSEI: NOT DETECTED
CANDIDA PARAPSILOSIS: NOT DETECTED
CANDIDA TROPICALIS: NOT DETECTED
Candida albicans: NOT DETECTED
ESCHERICHIA COLI: NOT DETECTED
Enterobacter cloacae complex: NOT DETECTED
Enterobacteriaceae species: NOT DETECTED
Enterococcus species: NOT DETECTED
Haemophilus influenzae: NOT DETECTED
KLEBSIELLA OXYTOCA: NOT DETECTED
KLEBSIELLA PNEUMONIAE: NOT DETECTED
Listeria monocytogenes: NOT DETECTED
Methicillin resistance: NOT DETECTED
Neisseria meningitidis: NOT DETECTED
PROTEUS SPECIES: NOT DETECTED
Pseudomonas aeruginosa: NOT DETECTED
STAPHYLOCOCCUS SPECIES: DETECTED — AB
STREPTOCOCCUS PYOGENES: NOT DETECTED
Serratia marcescens: NOT DETECTED
Staphylococcus aureus (BCID): DETECTED — AB
Streptococcus agalactiae: NOT DETECTED
Streptococcus pneumoniae: NOT DETECTED
Streptococcus species: NOT DETECTED

## 2016-04-27 LAB — CA 125: CA 125: 227.7 U/mL — ABNORMAL HIGH (ref 0.0–38.1)

## 2016-04-27 LAB — COMPREHENSIVE METABOLIC PANEL
ALBUMIN: 2.1 g/dL — AB (ref 3.5–5.0)
ALT: 9 U/L — ABNORMAL LOW (ref 14–54)
AST: 19 U/L (ref 15–41)
Alkaline Phosphatase: 63 U/L (ref 38–126)
Anion gap: 12 (ref 5–15)
BUN: 27 mg/dL — AB (ref 6–20)
CHLORIDE: 105 mmol/L (ref 101–111)
CO2: 18 mmol/L — AB (ref 22–32)
Calcium: 7.9 mg/dL — ABNORMAL LOW (ref 8.9–10.3)
Creatinine, Ser: 1.22 mg/dL — ABNORMAL HIGH (ref 0.44–1.00)
GFR calc Af Amer: 55 mL/min — ABNORMAL LOW (ref >60)
GFR, EST NON AFRICAN AMERICAN: 47 mL/min — AB (ref >60)
GLUCOSE: 156 mg/dL — AB (ref 65–99)
POTASSIUM: 4.9 mmol/L (ref 3.5–5.1)
SODIUM: 135 mmol/L (ref 135–145)
Total Bilirubin: 1.3 mg/dL — ABNORMAL HIGH (ref 0.3–1.2)
Total Protein: 5.1 g/dL — ABNORMAL LOW (ref 6.5–8.1)

## 2016-04-27 LAB — ECHOCARDIOGRAM COMPLETE
Height: 65 in
WEIGHTICAEL: 3118.19 [oz_av]

## 2016-04-27 LAB — GLUCOSE, CAPILLARY: Glucose-Capillary: 135 mg/dL — ABNORMAL HIGH (ref 65–99)

## 2016-04-27 LAB — URINE CULTURE: Culture: NO GROWTH

## 2016-04-27 LAB — PATHOLOGIST SMEAR REVIEW

## 2016-04-27 LAB — PREPARE RBC (CROSSMATCH)

## 2016-04-27 LAB — TROPONIN I: TROPONIN I: 0.11 ng/mL — AB (ref <0.03)

## 2016-04-27 LAB — TSH: TSH: 1.816 u[IU]/mL (ref 0.350–4.500)

## 2016-04-27 MED ORDER — METOPROLOL TARTRATE 25 MG PO TABS
25.0000 mg | ORAL_TABLET | Freq: Three times a day (TID) | ORAL | Status: DC
  Administered 2016-04-27 – 2016-04-29 (×6): 25 mg via ORAL
  Filled 2016-04-27 (×4): qty 1
  Filled 2016-04-27: qty 2
  Filled 2016-04-27: qty 1

## 2016-04-27 MED ORDER — AMIODARONE HCL IN DEXTROSE 360-4.14 MG/200ML-% IV SOLN
60.0000 mg/h | INTRAVENOUS | Status: DC
  Administered 2016-04-27: 60 mg/h via INTRAVENOUS
  Filled 2016-04-27: qty 200

## 2016-04-27 MED ORDER — DIPHENHYDRAMINE HCL 50 MG/ML IJ SOLN
25.0000 mg | Freq: Once | INTRAMUSCULAR | Status: AC
  Administered 2016-04-27: 25 mg via INTRAVENOUS
  Filled 2016-04-27: qty 1

## 2016-04-27 MED ORDER — PREMIER PROTEIN SHAKE
11.0000 [oz_av] | Freq: Three times a day (TID) | ORAL | Status: DC
  Administered 2016-04-28 – 2016-05-03 (×4): 11 [oz_av] via ORAL
  Filled 2016-04-27 (×28): qty 325.31

## 2016-04-27 MED ORDER — METOPROLOL TARTRATE 5 MG/5ML IV SOLN: 5.0000 mg | Freq: Four times a day (QID) | INTRAVENOUS | Status: DC | PRN

## 2016-04-27 MED ORDER — METOPROLOL TARTRATE 5 MG/5ML IV SOLN: 2.5000 mg | Freq: Four times a day (QID) | INTRAVENOUS | Status: DC

## 2016-04-27 MED ORDER — CEFAZOLIN SODIUM-DEXTROSE 1-4 GM/50ML-% IV SOLN
1.0000 g | Freq: Three times a day (TID) | INTRAVENOUS | Status: DC
  Filled 2016-04-27: qty 50

## 2016-04-27 MED ORDER — METOPROLOL TARTRATE 5 MG/5ML IV SOLN
5.0000 mg | Freq: Once | INTRAVENOUS | Status: AC
  Administered 2016-04-27: 5 mg via INTRAVENOUS
  Filled 2016-04-27: qty 5

## 2016-04-27 MED ORDER — IOPAMIDOL (ISOVUE-370) INJECTION 76%
INTRAVENOUS | Status: AC
  Administered 2016-04-27: 100 mL
  Filled 2016-04-27: qty 100

## 2016-04-27 MED ORDER — ACETAMINOPHEN 325 MG PO TABS
650.0000 mg | ORAL_TABLET | Freq: Once | ORAL | Status: AC
  Administered 2016-04-27: 650 mg via ORAL
  Filled 2016-04-27: qty 2

## 2016-04-27 MED ORDER — AMIODARONE LOAD VIA INFUSION
150.0000 mg | Freq: Once | INTRAVENOUS | Status: AC
  Administered 2016-04-27: 150 mg via INTRAVENOUS
  Filled 2016-04-27: qty 83.34

## 2016-04-27 MED ORDER — CEFAZOLIN SODIUM-DEXTROSE 2-4 GM/100ML-% IV SOLN
2.0000 g | Freq: Three times a day (TID) | INTRAVENOUS | Status: DC
  Administered 2016-04-27 – 2016-05-06 (×28): 2 g via INTRAVENOUS
  Filled 2016-04-27 (×31): qty 100

## 2016-04-27 MED ORDER — ADULT MULTIVITAMIN W/MINERALS CH
1.0000 | ORAL_TABLET | Freq: Every day | ORAL | Status: DC
  Administered 2016-04-27 – 2016-05-06 (×10): 1 via ORAL
  Filled 2016-04-27 (×10): qty 1

## 2016-04-27 MED ORDER — FERROUS SULFATE 325 (65 FE) MG PO TABS
325.0000 mg | ORAL_TABLET | Freq: Two times a day (BID) | ORAL | Status: DC
  Administered 2016-04-27 – 2016-05-06 (×18): 325 mg via ORAL
  Filled 2016-04-27 (×20): qty 1

## 2016-04-27 MED ORDER — SODIUM CHLORIDE 0.9 % IV SOLN
Freq: Once | INTRAVENOUS | Status: AC
  Administered 2016-04-27: 10:00:00 via INTRAVENOUS

## 2016-04-27 MED ORDER — SODIUM CHLORIDE 0.9 % IV SOLN
INTRAVENOUS | Status: DC
  Administered 2016-04-27 – 2016-04-30 (×3): via INTRAVENOUS

## 2016-04-27 MED ORDER — SODIUM CHLORIDE 0.9 % IV SOLN: 1500.0000 mg | INTRAVENOUS | Status: DC

## 2016-04-27 MED ORDER — AMIODARONE HCL IN DEXTROSE 360-4.14 MG/200ML-% IV SOLN: 30.0000 mg/h | INTRAVENOUS | Status: DC

## 2016-04-27 MED ORDER — SODIUM CHLORIDE 0.9 % IV BOLUS (SEPSIS)
250.0000 mL | Freq: Once | INTRAVENOUS | Status: AC
  Administered 2016-04-27: 250 mL via INTRAVENOUS

## 2016-04-27 NOTE — Consult Note (Signed)
Consult Note: Gyn-Onc  Consult was requested by Dr. Loren Racer for the evaluation of Allison Haynes 61 y.o. female  CC:  Chief Complaint  Patient presents with  . Abdominal Pain    Assessment/Plan:  Allison Haynes  is a 61 y.o.  year old with large volume ascites, a large abdomino-pelvic mass and apparent omental caking in the setting of sepsis, gram positive peritonitis, atrial fibrillation, anemia, thrombocytosis, poor conditioning and severe malnutrition and AKI.  I discussed with the patient and her daughter that the CT imaging (we reviewed the images together) looks concerning for an ovarian cancer. I would expect that, if present, the cytology from her paracentesis would confirm that.  If ovarian cancer is confirmed as an underlying process, it is a stage IIIC. Ovarian cancer at this stage is best treated with a combination approach of chemotherapy and surgical debulking. The sequencing of those elements is best selected based on the patient's underlying health condition and the likelihood that complete cytoreduction is performed at the time of the surgical effort. Randomized controlled trials show no difference in survival with either neoadjuvant chemotherapy or primary cytoreduction.   Given Allison Haynes's poor underlying health status, particularly her severe malnutrition and poor performance status, if ovarian cancer is confirmed on paracentesis cytology, I am recommending neoadjuvant chemotherapy with paclitaxel and carboplatin (potentially single agent carboplatin for cycle 1 pending her performance status evaluation) followed by re-imaging and surgical debulking after 3 cycles if her performance status and nutrition have optimized and she has shown response to chemotherapy we will intervene with surgery at that time prior to her completing additional 3 or more cycles of chemotherapy. I will consult my colleague, Dr Heath Lark to discuss a consult for chemotherapy once the patient's cytology has been  resulted.  Before she can be considered for chemotherapy she must be optimized from an infectious, renal and cardiac stand point.  It is unusual to see a gram positive peritonitis. It would speak against the likelihood of perforated viscous from tumor erosion as an etiology. Recommend continuing IV antibiotics and supportive care. Recommend nutritional supplementation and correction of her anemia with iron. The thrombocytosis is likely reactive and is common in the setting of advanced ovarian cancer.  I provided Allison Haynes with my contact information. We will continue to follow throughout the course of her hospitalization.   HPI: Allison Haynes is a 61 year old woman who is seen in consultation at the request of Dr Loren Racer for abdominal ascites and abdomino-pelvic masses concerning for ovarian cancer.  The patient reports that she had felt completely normal up until early April, 2018. At that time she remembers beginning to feel more fatigued and having difficulty eating (early satiety) with poor appetite. However, on Saturday, April 21st she began feeling acutely unwell quite suddenly with sudden distension of her abdomen, chills and malaise. She was unable to walk due to abdominal distension and so went to the [facility] ER.  While there a CT abdo/pelvis was performed which revealed a large complex mixed cystic and solid mass which emanates from one or both of the adnexal regions, highly concerning for ovarian neoplasm. This measures approximately 20.5 x 23.1 x 24.6 cm and has mixed cystic and solid components, with some heterogeneous internal calcification. Uterus is also heterogeneous in appearance, with multiple densely calcified lesions, presumably multiple fibroids. Large volume of ascites. Extensive nodular soft tissue thickening throughout the omentum, compatible with omental caking. Some of this extends through the umbilical region. No pneumoperitoneum. Small  left pleural effusion. Aortic  atherosclerosis.  Labs were drawn which showed a leukocytosis, anemia, thrombocytosis, protein wasting severe malnutrition (hypoalbuminemia) and mild AKI. She was admitted and blood and urine cultures were sent. She was empirically started on Zosyn and Vancomycin IV antibiotic therapy.  A paracentesis of the abdominal fluid was performed on 04/26/16 and 2+ L of sanguinous turbid fluid was removed which contained gram positive cocci in clusters on gram stain. It was sent for cytology which is pending.  Since admission her fever curve has stabilized, however her WBC continues to elevate. Her creatinine has increased today.  She feels too uncomfortable in her abdomen to walk.   She does not carry any diagnoses of underlying diseases because she last saw a doctor 30 years ago. She has never had surgery. She has had 2 prior SVD's. She has no first degree family members with a cancer diagnosis.  Current Meds:  Current Facility-Administered Medications:  .  0.9 %  sodium chloride infusion, , Intravenous, Continuous, Jonetta Osgood, MD .  acetaminophen (TYLENOL) suppository 650 mg, 650 mg, Rectal, Once, Elnora Morrison, MD, Stopped at 04/25/16 1955 .  acetaminophen (TYLENOL) tablet 650 mg, 650 mg, Oral, Q6H PRN **OR** acetaminophen (TYLENOL) suppository 650 mg, 650 mg, Rectal, Q6H PRN, Ilene Qua Opyd, MD .  albumin human 25 % solution 50 g, 50 g, Intravenous, Once PRN, Ilene Qua Opyd, MD .  cyanocobalamin ((VITAMIN B-12)) injection 1,000 mcg, 1,000 mcg, Subcutaneous, Daily, Jonetta Osgood, MD, 1,000 mcg at 04/27/16 1050 .  enoxaparin (LOVENOX) injection 40 mg, 40 mg, Subcutaneous, Q24H, Ilene Qua Opyd, MD, 40 mg at 04/26/16 2119 .  ferrous sulfate tablet 325 mg, 325 mg, Oral, BID WC, Shanker Kristeen Mans, MD .  HYDROmorphone (DILAUDID) injection 0.5-1 mg, 0.5-1 mg, Intravenous, Q3H PRN, Jonetta Osgood, MD, 1 mg at 04/27/16 0438 .  metoprolol (LOPRESSOR) injection 2.5 mg, 2.5 mg, Intravenous,  Q6H, Shanker Kristeen Mans, MD .  piperacillin-tazobactam (ZOSYN) IVPB 3.375 g, 3.375 g, Intravenous, Q8H, Rolla Flatten, [facility], Last Rate: 12.5 mL/hr at 04/27/16 0648, 3.375 g at 04/27/16 0648 .  polyethylene glycol (MIRALAX / GLYCOLAX) packet 17 g, 17 g, Oral, Daily, Jonetta Osgood, MD, Stopped at 04/26/16 1000 .  promethazine (PHENERGAN) tablet 12.5 mg, 12.5 mg, Oral, Q6H PRN, Ilene Qua Opyd, MD, 12.5 mg at 04/27/16 0436 .  sodium chloride flush (NS) 0.9 % injection 3 mL, 3 mL, Intravenous, Q12H, Ilene Qua Opyd, MD, 3 mL at 04/26/16 0807 .  [START ON 04/28/2016] vancomycin (VANCOCIN) 1,500 mg in sodium chloride 0.9 % 500 mL IVPB, 1,500 mg, Intravenous, Q24H, Shanker Kristeen Mans, MD  Allergy:  Allergies  Allergen Reactions  . Pseudoephedrine Other (See Comments)    "makes me loopy"    Social Hx:   Social History   Social History  . Marital status: N/A    Spouse name: N/A  . Number of children: N/A  . Years of education: N/A   Occupational History  . Not on file.   Social History Main Topics  . Smoking status: Never Smoker  . Smokeless tobacco: Never Used  . Alcohol use No  . Drug use: No  . Sexual activity: Not on file   Other Topics Concern  . Not on file   Social History Narrative  . No narrative on file    Past Surgical Hx: History reviewed. No pertinent surgical history.  Past Medical Hx:  Past Medical History:  Diagnosis Date  . Ovarian cancer (  [place]) 04/25/2016    Past Gynecological History:  SVD x 2, no paps x 30 years No LMP recorded. Patient is postmenopausal.  Family Hx:  Family History  Problem Relation Age of Onset  . Diabetes Mellitus II Mother   . Hypertension Mother   . Diabetes Mellitus II Father   . Hypertension Father   . Breast cancer Maternal Aunt   . Breast cancer Paternal Aunt     Review of Systems:  Constitutional  Feels fatigued, malaise  ENT Normal appearing ears and nares bilaterally Skin/Breast  No rash, sores, jaundice,  itching, dryness Cardiovascular  No chest pain Pulmonary  + SOB from abdominal distension Gastro Intestinal  No nausea, vomitting, or diarrhoea. No bright red blood per rectum,+ abdominal pain from distension. Genito Urinary  No frequency, urgency, dysuria, no bleeding Musculo Skeletal  No myalgia, arthralgia, joint swelling or pain  Neurologic  No weakness, numbness, change in gait,  Psychology  No depression, anxiety, insomnia.   Vitals:  Blood pressure (!) 154/50, pulse 96, temperature 98.6 F (37 C), temperature source Oral, resp. rate 16, height 5\' 5"  (1.651 m), weight 194 lb 14.2 oz (88.4 kg), SpO2 95 %.  Physical Exam: WD in NAD Neck  Supple NROM, without any enlargements.  Lymph Node Survey No cervical supraclavicular or inguinal adenopathy Cardiovascular  Irregularly irregular  Lungs  Clear to auscultation bilateraly, without wheezes/crackles/rhonchi. Good air movement.  Skin  No rash/lesions/breakdown  Psychiatry  Alert and oriented to person, place, and time  Abdomen  Normoactive bowel sounds, abdomen grossly distended with positive fluid wave. Mass not discretely appreciated due to tension from abdominal distension by fluid.  Back No CVA tenderness Genito Urinary  deferred Rectal  deferred  Extremities  No bilateral cyanosis, clubbing or edema.   Donaciano Eva, MD  04/27/2016, 9:55 AM  Cell: [phone] Office: [place]

## 2016-04-27 NOTE — Progress Notes (Addendum)
Earlier in the shift, while rounding, RNs informed patient had gone into AFIB RVR and they have received orders for 5mg  IV Lopressor and 500cc NS bolus and they were implementing orders,  I informed them to keep me posted if they needed further assistance.   At 2346: RN called about patient's HR in the 160-180s (AFIB RVR) and needing assistance in contacting providers.  I was in an emergency call and asked RN to call the [facility] and I would come to help when I could.  RN was able to reach provider, Cardizem drip was ordered and started, I was there when it started.  Patient was alert, talking, watching tv, BP were stable, and HR was still in the 160s. Drip was started and BP were cycling q 69mins.    I returned to my other call and called the RNs to check on the patient at 0044 and 0138, for updates, at 0042, drip was titrated up to 10mg /hr and at 0115 drip was titrated to 15mg .  RN will call NP since HR was in the 160s to see if Lopressor IV can be given and maybe some IVF if needed.  Lopressor 5mg  IV was ordered at 0208 per order record.  0315, spoke with RN on unit, HR improved, MD did see that patient, ordered another dose of IV Lopressor and CTA Chest to rule out PE  Will follow

## 2016-04-27 NOTE — Consult Note (Addendum)
[facility]  Date of Admission:  04/25/2016  Date of Consult:  04/27/2016  Reason for Consult: Peritonitis Referring Physician: Sloan Leiter  Impression/Recommendation Peritonitis Anemia, microcytic Pelvic mass, suspected Ovarian CA  Bacteremia, Staph areus (MSSA)  Would Change anbx to ancef Repeat BCx in AM Await GYN work up.  Needs tissue for dx? Defer palliative eval to primary team.   I would not pursue an endocarditis w/u on this pt at this time.   Thank you so much for this interesting consult,   Bobby Rumpf (pager) [phone] [email or web address]  Allison Haynes is an 61 y.o. female.  HPI: 61 yo F comes to ED on 4-21 with severe abd pain and distension. She had temp 38.9 and WBC 19.4 on adm. Her CT showed:highly concerning for ovarian malignancy with widespread intraperitoneal metastatic disease and malignant ascites. She developed temp to 102.1.  She was started on zosyn, vanco.  She underwent paracentesis 4-22 which showed 2.5L of turbid, bloody fluid. 12,625 WBC (91%N), G+ cocci in pairs and clusters.  Her course was further complicated by afib with RVR this AM.  Her BCx are now showing S aureus.  WBC now 33.8 (95%N)  Past Medical History:  Diagnosis Date  . Ovarian cancer ([place]) 04/25/2016    History reviewed. No pertinent surgical history.   Allergies  Allergen Reactions  . Pseudoephedrine Other (See Comments)    "makes me loopy"    Medications:  Scheduled: . acetaminophen  650 mg Rectal Once  . cyanocobalamin  1,000 mcg Subcutaneous Daily  . enoxaparin (LOVENOX) injection  40 mg Subcutaneous Q24H  . ferrous sulfate  325 mg Oral BID WC  . metoprolol tartrate  25 mg Oral Q8H  . polyethylene glycol  17 g Oral Daily  . sodium chloride flush  3 mL Intravenous Q12H    Abtx:  Anti-infectives    Start     Dose/Rate Route Frequency Ordered Stop   04/28/16 0500  vancomycin (VANCOCIN) 1,500 mg in sodium chloride 0.9 %  500 mL IVPB     1,500 mg 250 mL/hr over 120 Minutes Intravenous Every 24 hours 04/27/16 0908     04/26/16 1700  vancomycin (VANCOCIN) 1,250 mg in sodium chloride 0.9 % 250 mL IVPB  Status:  Discontinued     1,250 mg 166.7 mL/hr over 90 Minutes Intravenous Every 12 hours 04/26/16 0908 04/27/16 0908   04/26/16 0200  piperacillin-tazobactam (ZOSYN) IVPB 3.375 g     3.375 g 12.5 mL/hr over 240 Minutes Intravenous Every 8 hours 04/25/16 1827     04/25/16 2300  vancomycin (VANCOCIN) IVPB 1000 mg/200 mL premix  Status:  Discontinued     1,000 mg 200 mL/hr over 60 Minutes Intravenous Every 8 hours 04/25/16 2247 04/26/16 0908   04/25/16 1745  piperacillin-tazobactam (ZOSYN) IVPB 3.375 g     3.375 g 100 mL/hr over 30 Minutes Intravenous  Once 04/25/16 1738 04/25/16 1854      Total days of antibiotics: 2 zosyn, vanco         Social History:  reports that she has never smoked. She has never used smokeless tobacco. She reports that she does not drink alcohol or use drugs.  Family History  Problem Relation Age of Onset  . Diabetes Mellitus II Mother   . Hypertension Mother   . Diabetes Mellitus II Father   . Hypertension Father   . Breast cancer Maternal Aunt   . Breast cancer Paternal Aunt     General  ROS: no change in wt, early satiety, constipation+, normal urine.   Please see HPI. 12 point ROS o/w (-)  Blood pressure (!) 149/65, pulse 93, temperature 98.9 F (37.2 C), temperature source Oral, resp. rate (!) 22, height 5' 5"  (1.651 m), weight 88.4 kg (194 lb 14.2 oz), SpO2 98 %. General appearance: alert, cooperative, no distress and pale Eyes: negative findings: conjunctivae and sclerae normal and pupils equal, round, reactive to light and accomodation Throat: lips, mucosa, and tongue normal; teeth and gums normal Neck: no adenopathy and supple, symmetrical, trachea midline Lungs: clear to auscultation bilaterally Heart: regular rate and rhythm Abdomen: normal findings: bowel sounds  normal and abnormal findings:  distended, guarding and tender Extremities: edema none   Results for orders placed or performed during the hospital encounter of 04/25/16 (from the past 48 hour(s))  Comprehensive metabolic panel     Status: Abnormal   Collection Time: 04/25/16  5:45 PM  Result Value Ref Range   Sodium 134 (L) 135 - 145 mmol/L   Potassium 4.2 3.5 - 5.1 mmol/L   Chloride 99 (L) 101 - 111 mmol/L   CO2 22 22 - 32 mmol/L   Glucose, Bld 130 (H) 65 - 99 mg/dL   BUN 8 6 - 20 mg/dL   Creatinine, Ser 0.71 0.44 - 1.00 mg/dL   Calcium 7.9 (L) 8.9 - 10.3 mg/dL   Total Protein 5.7 (L) 6.5 - 8.1 g/dL   Albumin 2.0 (L) 3.5 - 5.0 g/dL   AST 24 15 - 41 U/L   ALT 12 (L) 14 - 54 U/L   Alkaline Phosphatase 91 38 - 126 U/L   Total Bilirubin 1.1 0.3 - 1.2 mg/dL   GFR calc non Af Amer >60 >60 mL/min   GFR calc Af Amer >60 >60 mL/min    Comment: (NOTE) The eGFR has been calculated using the CKD EPI equation. This calculation has not been validated in all clinical situations. eGFR's persistently <60 mL/min signify possible Chronic Kidney Disease.    Anion gap 13 5 - 15  CBC WITH DIFFERENTIAL     Status: Abnormal   Collection Time: 04/25/16  5:45 PM  Result Value Ref Range   WBC 19.4 (H) 4.0 - 10.5 K/uL   RBC 4.58 3.87 - 5.11 MIL/uL   Hemoglobin 8.2 (L) 12.0 - 15.0 g/dL   HCT 30.1 (L) 36.0 - 46.0 %   MCV 65.7 (L) 78.0 - 100.0 fL   MCH 17.9 (L) 26.0 - 34.0 pg   MCHC 27.2 (L) 30.0 - 36.0 g/dL   RDW 20.6 (H) 11.5 - 15.5 %   Platelets 616 (H) 150 - 400 K/uL   Neutrophils Relative % 69 %   Lymphocytes Relative 7 %   Monocytes Relative 2 %   Eosinophils Relative 0 %   Basophils Relative 0 %   Band Neutrophils 22 %   Metamyelocytes Relative 0 %   Myelocytes 0 %   Promyelocytes Absolute 0 %   Blasts 0 %   nRBC 0 0 /100 WBC   Other 0 %   WBC Morphology INCREASED BANDS (>20% BANDS)    Smear Review LARGE PLATELETS PRESENT     Comment: PLATELET CLUMPS NOTED ON SMEAR   Neutro Abs  17.6 (H) 1.7 - 7.7 K/uL   Lymphs Abs 1.4 0.7 - 4.0 K/uL   Monocytes Absolute 0.4 0.1 - 1.0 K/uL   Eosinophils Absolute 0.0 0.0 - 0.7 K/uL   Basophils Absolute 0.0 0.0 - 0.1 K/uL  Pathologist smear review     Status: None   Collection Time: 04/25/16  5:45 PM  Result Value Ref Range   Path Review Leukocytosis with predominance     Comment: of neutrophils. Macrocytic anemia Thrombocytosis. Reviewed by D.Butler,M.D.   I-stat chem 8, ed     Status: Abnormal   Collection Time: 04/25/16  5:50 PM  Result Value Ref Range   Sodium 134 (L) 135 - 145 mmol/L   Potassium 4.2 3.5 - 5.1 mmol/L   Chloride 98 (L) 101 - 111 mmol/L   BUN 9 6 - 20 mg/dL   Creatinine, Ser 0.70 0.44 - 1.00 mg/dL   Glucose, Bld 133 (H) 65 - 99 mg/dL   Calcium, Ion 1.07 (L) 1.15 - 1.40 mmol/L   TCO2 26 0 - 100 mmol/L   Hemoglobin 10.2 (L) 12.0 - 15.0 g/dL   HCT 30.0 (L) 36.0 - 46.0 %  I-Stat CG4 Lactic Acid, ED  (not at  [facility])     Status: Abnormal   Collection Time: 04/25/16  5:51 PM  Result Value Ref Range   Lactic Acid, Venous 3.28 (HH) 0.5 - 1.9 mmol/L   Comment NOTIFIED PHYSICIAN   Blood Culture (routine x 2)     Status: None (Preliminary result)   Collection Time: 04/25/16  5:54 PM  Result Value Ref Range   Specimen Description BLOOD RIGHT WRIST    Special Requests IN PEDIATRIC BOTTLE Blood Culture adequate volume    Culture NO GROWTH 2 DAYS    Report Status PENDING   Blood Culture (routine x 2)     Status: None (Preliminary result)   Collection Time: 04/25/16  5:58 PM  Result Value Ref Range   Specimen Description BLOOD RIGHT ANTECUBITAL    Special Requests      BOTTLES DRAWN AEROBIC AND ANAEROBIC Blood Culture adequate volume   Culture  Setup Time      GRAM POSITIVE COCCI IN CLUSTERS AEROBIC BOTTLE ONLY Organism ID to follow CRITICAL RESULT CALLED TO, READ BACK BY AND VERIFIED WITH: AElta Guadeloupe.D. 12:00 04/27/16 ([email or web address])    Culture GRAM POSITIVE COCCI    Report Status PENDING   Blood Culture  ID Panel (Reflexed)     Status: Abnormal   Collection Time: 04/25/16  5:58 PM  Result Value Ref Range   Enterococcus species NOT DETECTED NOT DETECTED   Listeria monocytogenes NOT DETECTED NOT DETECTED   Staphylococcus species DETECTED (A) NOT DETECTED    Comment: CRITICAL RESULT CALLED TO, READ BACK BY AND VERIFIED WITH: AElta Gu[place].D. 12:00 04/27/16 ([email or web address])    Staphylococcus aureus DETECTED (A) NOT DETECTED    Comment: Methicillin (oxacillin) susceptible Staphylococcus aureus (MSSA). Preferred therapy is anti staphylococcal beta lactam antibiotic (Cefazolin or Nafcillin), unless clinically contraindicated. CRITICAL RESULT CALLED TO, READ BACK BY AND VERIFIED WITH: AElta Gu[place].D. 12:00 04/27/16 ([email or web address])    Methicillin resistance NOT DETECTED NOT DETECTED   Streptococcus species NOT DETECTED NOT DETECTED   Streptococcus agalactiae NOT DETECTED NOT DETECTED   Streptococcus pneumoniae NOT DETECTED NOT DETECTED   Streptococcus pyogenes NOT DETECTED NOT DETECTED   Acinetobacter baumannii NOT DETECTED NOT DETECTED   Enterobacteriaceae species NOT DETECTED NOT DETECTED   Enterobacter cloacae complex NOT DETECTED NOT DETECTED   Escherichia coli NOT DETECTED NOT DETECTED   Klebsiella oxytoca NOT DETECTED NOT DETECTED   Klebsiella pneumoniae NOT DETECTED NOT DETECTED   Proteus species NOT DETECTED NOT DETECTED   Serratia marcescens NOT DETECTED NOT DETECTED   Haemophilus influenzae NOT  DETECTED NOT DETECTED   Neisseria meningitidis NOT DETECTED NOT DETECTED   Pseudomonas aeruginosa NOT DETECTED NOT DETECTED   Candida albicans NOT DETECTED NOT DETECTED   Candida glabrata NOT DETECTED NOT DETECTED   Candida krusei NOT DETECTED NOT DETECTED   Candida parapsilosis NOT DETECTED NOT DETECTED   Candida tropicalis NOT DETECTED NOT DETECTED  Protime-INR     Status: Abnormal   Collection Time: 04/25/16  7:20 PM  Result Value Ref Range   Prothrombin Time 17.1 (H) 11.4 - 15.2 seconds    INR 1.38   Type and screen     Status: None (Preliminary result)   Collection Time: 04/25/16  7:27 PM  Result Value Ref Range   ABO/RH(D) O NEG    Antibody Screen NEG    Sample Expiration 04/28/2016    Unit Number [id number]    Blood Component Type RED CELLS,LR    Unit division 00    Status of Unit ISSUED    Transfusion Status OK TO TRANSFUSE    Crossmatch Result Compatible   ABO/Rh     Status: None   Collection Time: 04/25/16  7:27 PM  Result Value Ref Range   ABO/RH(D) O NEG   I-Stat CG4 Lactic Acid, ED  (not at  [facility])     Status: None   Collection Time: 04/25/16  8:51 PM  Result Value Ref Range   Lactic Acid, Venous 1.84 0.5 - 1.9 mmol/L  HIV antibody (Routine Testing)     Status: None   Collection Time: 04/25/16  9:25 PM  Result Value Ref Range   HIV Screen 4th Generation wRfx Non Reactive Non Reactive    Comment: (NOTE) Performed At: [facility] [address] [place], [place] [id number] Lindon Romp MD YF:[id number]   Procalcitonin     Status: None   Collection Time: 04/25/16  9:25 PM  Result Value Ref Range   Procalcitonin 0.53 ng/mL    Comment:        Interpretation: PCT > 0.5 ng/mL and <= 2 ng/mL: Systemic infection (sepsis) is possible, but other conditions are known to elevate PCT as well. (NOTE)         ICU PCT Algorithm               Non ICU PCT Algorithm    ----------------------------     ------------------------------         PCT < 0.25 ng/mL                 PCT < 0.1 ng/mL     Stopping of antibiotics            Stopping of antibiotics       strongly encouraged.               strongly encouraged.    ----------------------------     ------------------------------       PCT level decrease by               PCT < 0.25 ng/mL       >= 80% from peak PCT       OR PCT 0.25 - 0.5 ng/mL          Stopping of antibiotics                                             encouraged.  Stopping of antibiotics           encouraged.     ----------------------------     ------------------------------       PCT level decrease by              PCT >= 0.25 ng/mL       < 80% from peak PCT        AND PCT >= 0.5 ng/mL             Continuing antibiotics                                              encouraged.       Continuing antibiotics            encouraged.    ----------------------------     ------------------------------     PCT level increase compared          PCT > 0.5 ng/mL         with peak PCT AND          PCT >= 0.5 ng/mL             Escalation of antibiotics                                          strongly encouraged.      Escalation of antibiotics        strongly encouraged.   MRSA PCR Screening     Status: None   Collection Time: 04/25/16 10:25 PM  Result Value Ref Range   MRSA by PCR NEGATIVE NEGATIVE    Comment:        The GeneXpert MRSA Assay (FDA approved for NASAL specimens only), is one component of a comprehensive MRSA colonization surveillance program. It is not intended to diagnose MRSA infection nor to guide or monitor treatment for MRSA infections.   Lactic acid, plasma     Status: None   Collection Time: 04/25/16 10:54 PM  Result Value Ref Range   Lactic Acid, Venous 1.9 0.5 - 1.9 mmol/L  Vitamin B12     Status: Abnormal   Collection Time: 04/25/16 10:54 PM  Result Value Ref Range   Vitamin B-12 175 (L) 180 - 914 pg/mL    Comment: (NOTE) This assay is not validated for testing neonatal or myeloproliferative syndrome specimens for Vitamin B12 levels.   Folate     Status: None   Collection Time: 04/25/16 10:54 PM  Result Value Ref Range   Folate 9.0 >5.9 ng/mL  Iron and TIBC     Status: Abnormal   Collection Time: 04/25/16 10:54 PM  Result Value Ref Range   Iron 5 (L) 28 - 170 ug/dL   TIBC 134 (L) 250 - 450 ug/dL   Saturation Ratios 4 (L) 10.4 - 31.8 %   UIBC 129 ug/dL  Ferritin     Status: None   Collection Time: 04/25/16 10:54 PM  Result Value Ref Range   Ferritin 98 11 -  307 ng/mL  Reticulocytes     Status: None   Collection Time: 04/25/16 10:54 PM  Result Value Ref Range   Retic Ct Pct 2.8 0.4 - 3.1 %   RBC. 4.30 3.87 - 5.11 MIL/uL   Retic Count,  Manual 120.4 19.0 - 186.0 K/uL  CBC with Differential     Status: Abnormal   Collection Time: 04/26/16  3:30 AM  Result Value Ref Range   WBC 25.2 (H) 4.0 - 10.5 K/uL   RBC 4.35 3.87 - 5.11 MIL/uL   Hemoglobin 7.6 (L) 12.0 - 15.0 g/dL    Comment: REPEATED TO VERIFY SPECIMEN CHECKED FOR CLOTS DELTA CHECK NOTED    HCT 28.6 (L) 36.0 - 46.0 %   MCV 65.7 (L) 78.0 - 100.0 fL   MCH 17.5 (L) 26.0 - 34.0 pg   MCHC 26.6 (L) 30.0 - 36.0 g/dL   RDW 19.9 (H) 11.5 - 15.5 %   Platelets 457 (H) 150 - 400 K/uL    Comment: PLATELET COUNT CONFIRMED BY SMEAR   Neutrophils Relative % 93 %   Lymphocytes Relative 3 %   Monocytes Relative 4 %   Eosinophils Relative 0 %   Basophils Relative 0 %   Neutro Abs 23.4 (H) 1.7 - 7.7 K/uL   Lymphs Abs 0.8 0.7 - 4.0 K/uL   Monocytes Absolute 1.0 0.1 - 1.0 K/uL   Eosinophils Absolute 0.0 0.0 - 0.7 K/uL   Basophils Absolute 0.0 0.0 - 0.1 K/uL   RBC Morphology POLYCHROMASIA PRESENT    WBC Morphology MILD LEFT SHIFT (1-5% METAS, OCC MYELO, OCC BANDS)    Smear Review LARGE PLATELETS PRESENT     Comment: PLATELET CLUMPS NOTED ON SMEAR, COUNT APPEARS ADEQUATE  Basic metabolic panel     Status: Abnormal   Collection Time: 04/26/16  3:30 AM  Result Value Ref Range   Sodium 136 135 - 145 mmol/L   Potassium 4.2 3.5 - 5.1 mmol/L   Chloride 105 101 - 111 mmol/L   CO2 21 (L) 22 - 32 mmol/L   Glucose, Bld 140 (H) 65 - 99 mg/dL   BUN 12 6 - 20 mg/dL   Creatinine, Ser 0.99 0.44 - 1.00 mg/dL   Calcium 7.3 (L) 8.9 - 10.3 mg/dL   GFR calc non Af Amer >60 >60 mL/min   GFR calc Af Amer >60 >60 mL/min    Comment: (NOTE) The eGFR has been calculated using the CKD EPI equation. This calculation has not been validated in all clinical situations. eGFR's persistently <60 mL/min signify possible  Chronic Kidney Disease.    Anion gap 10 5 - 15  Urinalysis, Routine w reflex microscopic (not at [facility])     Status: Abnormal   Collection Time: 04/26/16  5:30 AM  Result Value Ref Range   Color, Urine AMBER (A) YELLOW    Comment: BIOCHEMICALS MAY BE AFFECTED BY COLOR   APPearance CLEAR CLEAR   Specific Gravity, Urine >1.046 (H) 1.005 - 1.030   pH 5.0 5.0 - 8.0   Glucose, UA NEGATIVE NEGATIVE mg/dL   Hgb urine dipstick NEGATIVE NEGATIVE   Bilirubin Urine NEGATIVE NEGATIVE   Ketones, ur NEGATIVE NEGATIVE mg/dL   Protein, ur NEGATIVE NEGATIVE mg/dL   Nitrite NEGATIVE NEGATIVE   Leukocytes, UA NEGATIVE NEGATIVE  Urine culture     Status: None   Collection Time: 04/26/16  5:30 AM  Result Value Ref Range   Specimen Description URINE, RANDOM    Special Requests NONE    Culture NO GROWTH    Report Status 04/27/2016 FINAL   Glucose, capillary     Status: Abnormal   Collection Time: 04/26/16  8:04 AM  Result Value Ref Range   Glucose-Capillary 141 (H) 65 - 99 mg/dL  Hepatic function  panel     Status: Abnormal   Collection Time: 04/26/16  8:41 AM  Result Value Ref Range   Total Protein 5.0 (L) 6.5 - 8.1 g/dL   Albumin 1.6 (L) 3.5 - 5.0 g/dL   AST 14 (L) 15 - 41 U/L   ALT 9 (L) 14 - 54 U/L   Alkaline Phosphatase 67 38 - 126 U/L   Total Bilirubin 1.3 (H) 0.3 - 1.2 mg/dL   Bilirubin, Direct 0.6 (H) 0.1 - 0.5 mg/dL   Indirect Bilirubin 0.7 0.3 - 0.9 mg/dL  Lactate dehydrogenase (pleural or peritoneal fluid)     Status: Abnormal   Collection Time: 04/26/16  1:36 PM  Result Value Ref Range   LD, Fluid 4,811 (H) 3 - 23 U/L    Comment: RESULTS CONFIRMED BY MANUAL DILUTION (NOTE) Results should be evaluated in conjunction with serum values    Fluid Type-FLDH Peritoneal   Body fluid cell count with differential     Status: Abnormal   Collection Time: 04/26/16  1:36 PM  Result Value Ref Range   Fluid Type-FCT Peritoneal    Color, Fluid RED (A) YELLOW   Appearance, Fluid CLOUDY (A)  CLEAR   WBC, Fluid 12,625 (H) 0 - 1,000 cu mm   Neutrophil Count, Fluid 91 (H) 0 - 25 %   Lymphs, Fluid 3 %   Monocyte-Macrophage-Serous Fluid 6 (L) 50 - 90 %   Eos, Fluid 0 %   Other Cells, Fluid  %    BACTERIA PRESENT PLEASE CORRELATE WITH MICROBIOLOGY    Comment: RESULT CALLED TO, READ BACK BY AND VERIFIED WITH: A SHYSHKO,RN 04/26/16 1757 RHOLMES   Albumin, pleural or peritoneal fluid     Status: None   Collection Time: 04/26/16  1:36 PM  Result Value Ref Range   Albumin, Fluid 1.3 g/dL    Comment: (NOTE) No normal range established for this test Results should be evaluated in conjunction with serum values    Fluid Type-FALB Peritoneal   Protein, pleural or peritoneal fluid     Status: None   Collection Time: 04/26/16  1:36 PM  Result Value Ref Range   Total protein, fluid 3.2 g/dL    Comment: (NOTE) No normal range established for this test Results should be evaluated in conjunction with serum values    Fluid Type-FTP Peritoneal   Glucose, pleural or peritoneal fluid     Status: None   Collection Time: 04/26/16  1:36 PM  Result Value Ref Range   Glucose, Fluid 79 mg/dL    Comment: (NOTE) No normal range established for this test Results should be evaluated in conjunction with serum values    Fluid Type-FGLU Peritoneal   Culture, body fluid-bottle     Status: None (Preliminary result)   Collection Time: 04/26/16  1:36 PM  Result Value Ref Range   Specimen Description FLUID    Special Requests PERITONEAL    Culture NO GROWTH < 24 HOURS    Report Status PENDING   Gram stain     Status: None   Collection Time: 04/26/16  1:36 PM  Result Value Ref Range   Specimen Description FLUID    Special Requests PERITONEAL    Gram Stain      WBC PRESENT, PREDOMINANTLY PMN GRAM POSITIVE COCCI IN CLUSTERS IN PAIRS    Report Status 04/26/2016 FINAL   Pathologist smear review     Status: None   Collection Time: 04/26/16  1:36 PM  Result Value Ref Range  Path Review Mixed  inflammatory infiltrate     Comment: including neutrophils. Bacterial forms present. Reviewed by Thressa Sheller, M.D.   CBC with Differential/Platelet     Status: Abnormal   Collection Time: 04/27/16  3:09 AM  Result Value Ref Range   WBC 33.8 (H) 4.0 - 10.5 K/uL   RBC 3.93 3.87 - 5.11 MIL/uL   Hemoglobin 7.1 (L) 12.0 - 15.0 g/dL   HCT 26.1 (L) 36.0 - 46.0 %   MCV 66.4 (L) 78.0 - 100.0 fL   MCH 18.1 (L) 26.0 - 34.0 pg   MCHC 27.2 (L) 30.0 - 36.0 g/dL   RDW 20.3 (H) 11.5 - 15.5 %   Platelets 427 (H) 150 - 400 K/uL   Neutrophils Relative % 95 %   Lymphocytes Relative 2 %   Monocytes Relative 3 %   Eosinophils Relative 0 %   Basophils Relative 0 %   Neutro Abs 32.1 (H) 1.7 - 7.7 K/uL   Lymphs Abs 0.7 0.7 - 4.0 K/uL   Monocytes Absolute 1.0 0.1 - 1.0 K/uL   Eosinophils Absolute 0.0 0.0 - 0.7 K/uL   Basophils Absolute 0.0 0.0 - 0.1 K/uL   RBC Morphology ELLIPTOCYTES     Comment: POLYCHROMASIA PRESENT  TSH     Status: None   Collection Time: 04/27/16  3:09 AM  Result Value Ref Range   TSH 1.816 0.350 - 4.500 uIU/mL    Comment: Performed by a 3rd Generation assay with a functional sensitivity of <=0.01 uIU/mL.  Lactic acid, plasma     Status: None   Collection Time: 04/27/16  3:09 AM  Result Value Ref Range   Lactic Acid, Venous 1.4 0.5 - 1.9 mmol/L  Comprehensive metabolic panel     Status: Abnormal   Collection Time: 04/27/16  3:09 AM  Result Value Ref Range   Sodium 135 135 - 145 mmol/L   Potassium 4.9 3.5 - 5.1 mmol/L   Chloride 105 101 - 111 mmol/L   CO2 18 (L) 22 - 32 mmol/L   Glucose, Bld 156 (H) 65 - 99 mg/dL   BUN 27 (H) 6 - 20 mg/dL   Creatinine, Ser 1.22 (H) 0.44 - 1.00 mg/dL   Calcium 7.9 (L) 8.9 - 10.3 mg/dL   Total Protein 5.1 (L) 6.5 - 8.1 g/dL   Albumin 2.1 (L) 3.5 - 5.0 g/dL   AST 19 15 - 41 U/L   ALT 9 (L) 14 - 54 U/L   Alkaline Phosphatase 63 38 - 126 U/L   Total Bilirubin 1.3 (H) 0.3 - 1.2 mg/dL   GFR calc non Af Amer 47 (L) >60 mL/min   GFR calc Af  Amer 55 (L) >60 mL/min    Comment: (NOTE) The eGFR has been calculated using the CKD EPI equation. This calculation has not been validated in all clinical situations. eGFR's persistently <60 mL/min signify possible Chronic Kidney Disease.    Anion gap 12 5 - 15  Troponin I     Status: Abnormal   Collection Time: 04/27/16  3:09 AM  Result Value Ref Range   Troponin I 0.11 (HH) <0.03 ng/mL    Comment: CRITICAL RESULT CALLED TO, READ BACK BY AND VERIFIED WITH: Loretha Brasil [id number] WILDERK   Prepare RBC     Status: None   Collection Time: 04/27/16  7:04 AM  Result Value Ref Range   Order Confirmation ORDER PROCESSED BY BLOOD BANK   Glucose, capillary     Status: Abnormal  Collection Time: 04/27/16  7:45 AM  Result Value Ref Range   Glucose-Capillary 135 (H) 65 - 99 mg/dL      Component Value Date/Time   SDES FLUID 04/26/2016 1336   SDES FLUID 04/26/2016 1336   SPECREQUEST PERITONEAL 04/26/2016 1336   SPECREQUEST PERITONEAL 04/26/2016 1336   CULT NO GROWTH < 24 HOURS 04/26/2016 1336   REPTSTATUS PENDING 04/26/2016 1336   REPTSTATUS 04/26/2016 FINAL 04/26/2016 1336   Ct Angio Chest Pe W Or Wo Contrast  Result Date: 04/27/2016 CLINICAL DATA:  61 year old female with history of ovarian cancer presenting with tachycardia. EXAM: CT ANGIOGRAPHY CHEST WITH CONTRAST TECHNIQUE: Multidetector CT imaging of the chest was performed using the standard protocol during bolus administration of intravenous contrast. Multiplanar CT image reconstructions and MIPs were obtained to evaluate the vascular anatomy. CONTRAST:  100 cc Isovue 370 COMPARISON:  Chest radiograph dated 04/25/2016 and CT of the abdomen pelvis dated 04/25/2016 FINDINGS: Cardiovascular: There is mild cardiomegaly. No pericardial effusion. There is coronary vascular calcification involving the LAD. The thoracic aorta appears unremarkable. The origins of the great vessels of the aortic arch appear patent. There is dilatation of the  main pulmonary trunk suggestive of underlying pulmonary hypertension. Evaluation of the pulmonary arteries is limited due to suboptimal opacification of the peripheral branches. No central pulmonary artery embolus identified. Mediastinum/Nodes: There is no hilar or mediastinal adenopathy. The esophagus and the thyroid gland are grossly unremarkable. Lungs/Pleura: There is a small left pleural effusion. There are consolidative changes of the left lower lobe as well as scattered densities in the left upper lobe. Overall there is volume loss in the left lung with shift of the heart into the left hemithorax. There is segmental air trapping in the right middle lobe with diffuse hazy airspace density in the right upper and right lower lobes. Paramediastinal subpleural density in the right upper lobe likely represents atelectatic changes or pleural thickening. There is no pneumothorax. Upper Abdomen: Ascites is partially visualized. Musculoskeletal: Multilevel degenerative changes of the spine. No acute osseous pathology. Review of the MIP images confirms the above findings. IMPRESSION: 1. No CT evidence of pulmonary artery embolus. 2. Areas of consolidative change involving the left lower lobe as well as scattered pulmonary densities in the left upper lobe. Findings may represent pneumonia. Correlation with clinical exam recommended. 3. Overall decreased left lung volume with leftward shift of the mediastinum and heart. 4. Segmental air trapping in the right middle lobe with diffuse haziness of the right upper and right lower lobes. 5. Ascites. Electronically Signed   By: Anner Crete M.D.   On: 04/27/2016 05:40   Ct Abdomen Pelvis W Contrast  Result Date: 04/25/2016 CLINICAL DATA:  61 year old female with history of abdominal distension and nausea. No recent vomiting. EXAM: CT ABDOMEN AND PELVIS WITH CONTRAST TECHNIQUE: Multidetector CT imaging of the abdomen and pelvis was performed using the standard protocol  following bolus administration of intravenous contrast. CONTRAST:  1 ISOVUE-300 IOPAMIDOL (ISOVUE-300) INJECTION 61% COMPARISON:  No priors. FINDINGS: Lower chest: Air trapping right middle lobe. Small left pleural effusion. Atherosclerotic calcifications in the left anterior descending and left circumflex coronary arteries. Hepatobiliary: No suspicious cystic or solid hepatic lesions. No intra or extrahepatic biliary ductal dilatation. Small calcified gallstones lying dependently in the gallbladder. No findings to suggest an acute cholecystitis at this time. Pancreas: No pancreatic mass. No pancreatic ductal dilatation. No pancreatic or peripancreatic fluid or inflammatory changes. Spleen: Unremarkable. Adrenals/Urinary Tract: Right kidney and bilateral adrenal glands are normal  in appearance. 1.9 cm simple cyst in the interpolar region of the left kidney. No hydroureteronephrosis. Urinary bladder is normal in appearance. Stomach/Bowel: The appearance of the stomach is normal. There is no pathologic dilatation of small bowel or colon. Appendix is not confidently visualized. Vascular/Lymphatic: No significant atherosclerotic disease, aneurysm or dissection identified in the abdominal or pelvic vasculature. No lymphadenopathy noted in the abdomen or pelvis. Reproductive: Large complex mixed cystic and solid mass which emanates from one or both of the adnexal regions, highly concerning for ovarian neoplasm. This measures approximately 20.5 x 23.1 x 24.6 cm (coronal image 46 of series 6 and sagittal image 65 of series 7), and has mixed cystic and solid components, with some heterogeneous internal calcification. Uterus is also heterogeneous in appearance, with multiple densely calcified lesions, presumably multiple fibroids, largest of which measures 5.5 cm in the anterior aspect of the fundal region. Other: Large volume of ascites. Extensive nodular soft tissue thickening throughout the omentum, compatible with omental  caking. Some of this extends through the umbilical region. No pneumoperitoneum. Musculoskeletal: There are no aggressive appearing lytic or blastic lesions noted in the visualized portions of the skeleton. IMPRESSION: 1. 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 Consultation with Gynecology is recommended. 2. Small left pleural effusion. 3. Aortic atherosclerosis, in addition to 2 vessel coronary artery disease. Please note that although the presence of coronary artery calcium documents the presence of coronary artery disease, the severity of this disease and any potential stenosis cannot be assessed on this non-gated CT examination. Assessment for potential risk factor modification, dietary therapy or pharmacologic therapy may be warranted, if clinically indicated. 4. Cholelithiasis. Electronically Signed   By: Vinnie Langton M.D.   On: 04/25/2016 18:59   US Paracentesis  Result Date: 04/26/2016 INDICATION: Patient with abdominal pain and large complex solid/ cystic adnexal mass, ascites, omental thickening concerning for ovarian cancer; request received for diagnostic and therapeutic paracentesis. EXAM: ULTRASOUND GUIDED DIAGNOSTIC AND THERAPEUTIC PARACENTESIS MEDICATIONS: None. COMPLICATIONS: None immediate. PROCEDURE: Informed written consent was obtained from the patient after a discussion of the risks, benefits and alternatives to treatment. A timeout was performed prior to the initiation of the procedure. Initial ultrasound scanning demonstrates a moderate amount of ascites within the left mid to lower abdominal quadrant. The left mid to lower abdomen was prepped and draped in the usual sterile fashion. 1% lidocaine was used for local anesthesia. Following this, a Yueh catheter was introduced. An ultrasound image was saved for documentation purposes. The paracentesis was performed. The catheter was removed and a  dressing was applied. The patient tolerated the procedure well without immediate post procedural complication. FINDINGS: A total of approximately 2.5 liters of turbid, bloody fluid was removed. Samples were sent to the laboratory as requested by the clinical team. IMPRESSION: Successful ultrasound-guided diagnostic and therapeutic paracentesis yielding 2.5 liters of peritoneal fluid. Read by: Rowe Robert, PA-C Electronically Signed   By: Corrie Mckusick D.O.   On: 04/26/2016 13:58   Dg Chest Port 1 View  Result Date: 04/25/2016 CLINICAL DATA:  Abdominal distension with fever EXAM: PORTABLE CHEST 1 VIEW COMPARISON:  None. FINDINGS: Low lung volumes. Non inclusion of the left CP angle. Borderline to mild cardiomegaly, exaggerated by low lung volume. Mild atherosclerosis. No focal consolidation. No pneumothorax. IMPRESSION: Low lung volumes, without infiltrate or edema. Borderline to mild cardiomegaly. Electronically Signed   By: Donavan Foil M.D.   On: 04/25/2016 17:54   Recent  Results (from the past 240 hour(s))  Blood Culture (routine x 2)     Status: None (Preliminary result)   Collection Time: 04/25/16  5:54 PM  Result Value Ref Range Status   Specimen Description BLOOD RIGHT WRIST  Final   Special Requests IN PEDIATRIC BOTTLE Blood Culture adequate volume  Final   Culture NO GROWTH 2 DAYS  Final   Report Status PENDING  Incomplete  Blood Culture (routine x 2)     Status: None (Preliminary result)   Collection Time: 04/25/16  5:58 PM  Result Value Ref Range Status   Specimen Description BLOOD RIGHT ANTECUBITAL  Final   Special Requests   Final    BOTTLES DRAWN AEROBIC AND ANAEROBIC Blood Culture adequate volume   Culture  Setup Time   Final    GRAM POSITIVE COCCI IN CLUSTERS AEROBIC BOTTLE ONLY Organism ID to follow CRITICAL RESULT CALLED TO, READ BACK BY AND VERIFIED WITH: AElta Gu[place].D. 12:00 04/27/16 ([email or web address])    Culture GRAM POSITIVE COCCI  Final   Report Status PENDING   Incomplete  Blood Culture ID Panel (Reflexed)     Status: Abnormal   Collection Time: 04/25/16  5:58 PM  Result Value Ref Range Status   Enterococcus species NOT DETECTED NOT DETECTED Final   Listeria monocytogenes NOT DETECTED NOT DETECTED Final   Staphylococcus species DETECTED (A) NOT DETECTED Final    Comment: CRITICAL RESULT CALLED TO, READ BACK BY AND VERIFIED WITH: AElta Gu[place].D. 12:00 04/27/16 ([email or web address])    Staphylococcus aureus DETECTED (A) NOT DETECTED Final    Comment: Methicillin (oxacillin) susceptible Staphylococcus aureus (MSSA). Preferred therapy is anti staphylococcal beta lactam antibiotic (Cefazolin or Nafcillin), unless clinically contraindicated. CRITICAL RESULT CALLED TO, READ BACK BY AND VERIFIED WITH: AElta [place].D. 12:00 04/27/16 ([email or web address])    Methicillin resistance NOT DETECTED NOT DETECTED Final   Streptococcus species NOT DETECTED NOT DETECTED Final   Streptococcus agalactiae NOT DETECTED NOT DETECTED Final   Streptococcus pneumoniae NOT DETECTED NOT DETECTED Final   Streptococcus pyogenes NOT DETECTED NOT DETECTED Final   Acinetobacter baumannii NOT DETECTED NOT DETECTED Final   Enterobacteriaceae species NOT DETECTED NOT DETECTED Final   Enterobacter cloacae complex NOT DETECTED NOT DETECTED Final   Escherichia coli NOT DETECTED NOT DETECTED Final   Klebsiella oxytoca NOT DETECTED NOT DETECTED Final   Klebsiella pneumoniae NOT DETECTED NOT DETECTED Final   Proteus species NOT DETECTED NOT DETECTED Final   Serratia marcescens NOT DETECTED NOT DETECTED Final   Haemophilus influenzae NOT DETECTED NOT DETECTED Final   Neisseria meningitidis NOT DETECTED NOT DETECTED Final   Pseudomonas aeruginosa NOT DETECTED NOT DETECTED Final   Candida albicans NOT DETECTED NOT DETECTED Final   Candida glabrata NOT DETECTED NOT DETECTED Final   Candida krusei NOT DETECTED NOT DETECTED Final   Candida parapsilosis NOT DETECTED NOT DETECTED Final   Candida  tropicalis NOT DETECTED NOT DETECTED Final  MRSA PCR Screening     Status: None   Collection Time: 04/25/16 10:25 PM  Result Value Ref Range Status   MRSA by PCR NEGATIVE NEGATIVE Final    Comment:        The GeneXpert MRSA Assay (FDA approved for NASAL specimens only), is one component of a comprehensive MRSA colonization surveillance program. It is not intended to diagnose MRSA infection nor to guide or monitor treatment for MRSA infections.   Urine culture     Status: None   Collection Time: 04/26/16  5:30 AM  Result Value  Ref Range Status   Specimen Description URINE, RANDOM  Final   Special Requests NONE  Final   Culture NO GROWTH  Final   Report Status 04/27/2016 FINAL  Final  Culture, body fluid-bottle     Status: None (Preliminary result)   Collection Time: 04/26/16  1:36 PM  Result Value Ref Range Status   Specimen Description FLUID  Final   Special Requests PERITONEAL  Final   Culture NO GROWTH < 24 HOURS  Final   Report Status PENDING  Incomplete  Gram stain     Status: None   Collection Time: 04/26/16  1:36 PM  Result Value Ref Range Status   Specimen Description FLUID  Final   Special Requests PERITONEAL  Final   Gram Stain   Final    WBC PRESENT, PREDOMINANTLY PMN GRAM POSITIVE COCCI IN CLUSTERS IN PAIRS    Report Status 04/26/2016 FINAL  Final      04/27/2016, 3:08 PM     LOS: 2 days    Records and images were personally reviewed where available.

## 2016-04-27 NOTE — Progress Notes (Signed)
Pharmacy Antibiotic Note Allison Haynes is a 61 y.o. female admitted on 04/25/2016 with sepsis. S/p therapeutic  paracentesis on 4/22 that has grown GPC cocci in clusters and pairs on gram stain. Currently on day 3 of Zosyn and day 2 of vancomycin for treatment.   WBC continues to trend up. SCr is now 1.2 < 0.9 < 0.70 (on admission)  Plan: 1. Zosyn 3.375g IV q8h (4 hour infusion).  2. Empirically adjust vancomycin to 1500 mg IV every 24 hours based on renal fxn trend 3. BMP in am 4. Await ID eval   Height: 5\' 5"  (165.1 cm) Weight: 194 lb 14.2 oz (88.4 kg) IBW/kg (Calculated) : 57  Temp (24hrs), Avg:98.2 F (36.8 C), Min:97.5 F (36.4 C), Max:98.9 F (37.2 C)   Recent Labs Lab 04/25/16 1745 04/25/16 1750 04/25/16 1751 04/25/16 2051 04/25/16 2254 04/26/16 0330 04/27/16 0309  WBC 19.4*  --   --   --   --  25.2* 33.8*  CREATININE 0.71 0.70  --   --   --  0.99 1.22*  LATICACIDVEN  --   --  3.28* 1.84 1.9  --  1.4    Estimated Creatinine Clearance: 53.9 mL/min (A) (by C-G formula based on SCr of 1.22 mg/dL (H)).    Allergies  Allergen Reactions  . Pseudoephedrine Other (See Comments)    "makes me loopy"    Antimicrobials this admission: Zosyn 4/21 >> Vanc 4/22 >>   Microbiology results: 4/22 peritoneal fluid: GPC in clusters and pairs on gram stain  4/21 BCx x2 >> ngtd 4/21 UCx >> ngF 4/21 MRSA PCR >>Negative   Thank you for allowing pharmacy to be a part of this patient's care.  Vincenza Hews, PharmD, BCPS 04/27/2016, 9:07 AM

## 2016-04-27 NOTE — Care Management Note (Addendum)
Case Management Note  Patient Details  Name: Allison Haynes MRN: [id number] Date of Birth: Aug 22, 1955  Subjective/Objective:    From [place]  With son , presents with severe abd pain and distension. She had temp 38.9 and WBC 19.4 on adm. Her CT showed:highly concerning for ovarian malignancy with widespread intraperitoneal metastatic disease and malignant ascites.  Per pt eval rc SNF, CSW referral.  4/24 1550 - Tomi Bamberger RN, BSN - s/p paracentesis today, new dx of ovarian ca.                Action/Plan: NCM will follow for dc needs.  Expected Discharge Date:  04/28/16               Expected Discharge Plan:  Skilled Nursing Facility  In-House Referral:  Clinical Social Work  Discharge planning Services  CM Consult  Post Acute Care Choice:    Choice offered to:     DME Arranged:    DME Agency:     HH Arranged:    [place] Agency:     Status of Service:  Completed, signed off  If discussed at [employer], dates discussed:    Additional Comments:  Zenon Mayo, RN 04/27/2016, 6:16 PM

## 2016-04-27 NOTE — Clinical Social Work Note (Signed)
Clinical Social Work Assessment  Patient Details  Name: Allison Haynes MRN: [id number] Date of Birth: 10-Oct-1955  Date of referral:  04/27/16               Reason for consult:  Facility Placement                Permission sought to share information with:  Facility Sport and exercise psychologist, Family Supports Permission granted to share information::  Yes, Verbal Permission Granted  Name::     [place]::  [place]  Relationship::  dtr  Contact Information:     Housing/Transportation Living arrangements for the past 2 months:  [place] of Information:  Patient, Adult Children Patient Interpreter Needed:  None Criminal Activity/Legal Involvement Pertinent to Current Situation/Hospitalization:  No - Comment as needed Significant Relationships:  Adult Children, Siblings, Parents Lives with:  Adult Children (son) Do you feel safe going back to the place where you live?  No Need for family participation in patient care:  Yes (Comment) (dtr assisting with planning)  Care giving concerns:  According to pt dtr pt was living in [place], [place] with adult son- states this situation was horrible and neither patient or pt son had jobs or took care of themselves.  Dtr very concerned about next steps for pt since likely has new diagnosis of stage 3 ovarian cancer.  Social Worker assessment / plan:  CSW received call from patient dtr concerning pt current situation and inquiring about next steps.  Pt dtr lives in [place], [place] in one bedroom apartment with boyfriend- does not think she would be capable of bringing patient to live with her.  Dtr states that pt was in [place] with pt son but that it is not an option for pt to return to that living environment.  Pt has a aunt and her mom who live in [place] but pt aunt ("Boots") is already caregiver for pt mom and her own husband-  Dtr has already discussed idea of pt living with her aunt- aunt is agreeable as  long as patient is independent with ADLs.  There are many medical concerns making it difficult to plan ahead for patient at this time- patient currently bed bound due to abdominal distention/pain and will likely be recommended for SNF when PT works with her if she is unable to improve.  Patient also with likely ovarian cancer diagnosis- pt dtr has been informed pt will likely be transferred directly to [facility] to begin cancer treatment instead of being taken home first.  CSW mentioned possibility of pt being recommended for SNF- will discuss at later date if pt is appropriate.  Dtr also concerned with pt lack of insurance- CSW explained financial counseling role and left message for two financial counselors regarding this case.  Employment status:    Insurance information:  Self Pay (Medicaid Pending) PT Recommendations:  Not assessed at this time Information / Referral to community resources:     Patient/Family's Response to care:  Pt is agreeable to dtr assistance in this process- agreeable to CSW assisting in contacting financial counseling and working on disposition planning.  Patient/Family's Understanding of and Emotional Response to Diagnosis, Current Treatment, and Prognosis:  Patient seemed to be very calm but pleasant during interview- did not show any outward signs of stress from possible cancer diagnosis.  Pt dtr also seemed very driven to help pt but not overly emotional at this time- had good understanding of the situation  and showing determination to be of help in anyway possible.  Emotional Assessment Appearance:  Appears older than stated age Attitude/Demeanor/Rapport:    Affect (typically observed):  Appropriate, Pleasant Orientation:  Oriented to Self, Oriented to Place, Oriented to  Time, Oriented to Situation Alcohol / Substance use:  Not Applicable Psych involvement (Current and /or in the community):  No (Comment)  Discharge Needs  Concerns to be addressed:  Home  Safety Concerns, Discharge Planning Concerns, Care Coordination, Financial / Insurance Concerns Readmission within the last 30 days:  No Current discharge risk:  Physical Impairment Barriers to Discharge:  Continued Medical Work up   Jorge Ny, LCSW 04/27/2016, 1:51 PM

## 2016-04-27 NOTE — Progress Notes (Addendum)
CRITICAL VALUE ALERT  Critical value received:  Troponin 0.11  Date of notification:  04/27/16  Time of notification:  0430  Critical value read back: YES  Nurse who received alert:  Pieter Partridge, RN  MD notified (1st page):  Hal Hope, MD  Time of first page:  [phone]  MD notified (2nd page):  Time of second page:  Responding MD:  Dr. Hal Hope  Time MD responded:  [phone]  Amiodarone gtt stopped - pt in normal sinus.  Milford Cage, RN

## 2016-04-27 NOTE — Progress Notes (Signed)
Ascitic gm stain results/cell count results d/w GYN on call-Dr Glennon Mac Moore-recommended to continue with Abx-and maybe d/w  ID regarding optimal regimen. Dr Denman George to come by this morning to evaluate. CT Scan did not show any bowel perforation-so she does not think this is surgical. Will d/w with gen surgery and ID and await further recs from GYN Oncology.

## 2016-04-27 NOTE — Consult Note (Signed)
[facility] Surgery Consult Note  Allison Haynes [birth year]  [id number].    Requesting MD: Sloan Leiter, MD Chief Complaint/Reason for Consult: leukocytosis, abnormal ascitic fluid, r/o intestinal perforation    HPI:  Allison Haynes is a 61 y.o. Female with no known medical problems prior to this hospital admission who presented to [facility] with severe abdominal pain and distention on 04/25/16. She reported a history of intermittent abdominal discomfort and distention for about a month prior to presentation. 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 The patient was admitted for pain control and evaluation by Gyn Onc. She underwent paracentesis on 4/22 and gram stain reveals gram positive cocci in pairs, cultures are pending. General surgery has been asked to evaluate the patient. Since admission, patients hospitalization has been further complicated by A.fib with RVR.  Today patient denies abdominal pain - just endorses abdominal pressure/discomfort and one episode of nausea yesterday that was relived by anti-emetics. Reports a one-month history of decreased appetite but did not notice weight loss, just an enlarging abdomen. Daughter bedside reports noticing her mother looked thinner at [place]. She reports regular, non-bloody bowel movements prior to admission.  ROS: Review of Systems  Constitutional: Positive for weight loss. Negative for chills and fever.  Gastrointestinal: Positive for abdominal pain and nausea. Negative for blood in stool, constipation, diarrhea, melena and vomiting.  All other systems reviewed and are negative.   Family History  Problem Relation Age of Onset  . Diabetes Mellitus II Mother   . Hypertension Mother   . Diabetes Mellitus II Father   .  Hypertension Father   . Breast cancer Maternal Aunt   . Breast cancer Paternal Aunt     Past Medical History:  Diagnosis Date  . Ovarian cancer ([place]) 04/25/2016    History reviewed. No pertinent surgical history.  Social History:  reports that she has never smoked. She has never used smokeless tobacco. She reports that she does not drink alcohol or use drugs.  Allergies:  Allergies  Allergen Reactions  . Pseudoephedrine Other (See Comments)    "makes me loopy"    No prescriptions prior to admission.    Blood pressure (!) 132/45, pulse 80, temperature 98.6 F (37 C), temperature source Oral, resp. rate 16, height 5' 5"  (1.651 m), weight 88.4 kg (194 lb 14.2 oz), SpO2 97 %. Physical Exam: Physical Exam  Constitutional: She is oriented to person, place, and time. She appears well-developed and well-nourished. No distress.  HENT:  Head: Normocephalic and atraumatic.  Right Ear: External ear normal.  Left Ear: External ear normal.  Mouth/Throat: Oropharynx is clear and moist.  Eyes: EOM are normal. Pupils are equal, round, and reactive to light. No scleral icterus.  Neck: Normal range of motion. No tracheal deviation present. No thyromegaly present.  Cardiovascular: Normal rate, regular rhythm, normal heart sounds and intact distal pulses.  Exam reveals no gallop and no friction rub.   No murmur heard. Pulmonary/Chest: Effort normal and breath sounds normal. No respiratory distress. She has no wheezes. She exhibits no tenderness.  Abdominal: Soft. Bowel sounds are normal. She exhibits distension. There is no tenderness. There is no guarding. A hernia (umbilical, reducible ) is present.  Musculoskeletal: Normal range of motion. She exhibits no deformity.  Neurological: She is alert and oriented to person, place, and time. No sensory deficit.  Skin: Skin is warm and dry.  She is not diaphoretic. No erythema.  Psychiatric: She has a normal mood and affect. Her behavior is normal.     Results for orders placed or performed during the hospital encounter of 04/25/16 (from the past 48 hour(s))  Comprehensive metabolic panel     Status: Abnormal   Collection Time: 04/25/16  5:45 PM  Result Value Ref Range   Sodium 134 (L) 135 - 145 mmol/L   Potassium 4.2 3.5 - 5.1 mmol/L   Chloride 99 (L) 101 - 111 mmol/L   CO2 22 22 - 32 mmol/L   Glucose, Bld 130 (H) 65 - 99 mg/dL   BUN 8 6 - 20 mg/dL   Creatinine, Ser 0.71 0.44 - 1.00 mg/dL   Calcium 7.9 (L) 8.9 - 10.3 mg/dL   Total Protein 5.7 (L) 6.5 - 8.1 g/dL   Albumin 2.0 (L) 3.5 - 5.0 g/dL   AST 24 15 - 41 U/L   ALT 12 (L) 14 - 54 U/L   Alkaline Phosphatase 91 38 - 126 U/L   Total Bilirubin 1.1 0.3 - 1.2 mg/dL   GFR calc non Af Amer >60 >60 mL/min   GFR calc Af Amer >60 >60 mL/min    Comment: (NOTE) The eGFR has been calculated using the CKD EPI equation. This calculation has not been validated in all clinical situations. eGFR's persistently <60 mL/min signify possible Chronic Kidney Disease.    Anion gap 13 5 - 15  CBC WITH DIFFERENTIAL     Status: Abnormal   Collection Time: 04/25/16  5:45 PM  Result Value Ref Range   WBC 19.4 (H) 4.0 - 10.5 K/uL   RBC 4.58 3.87 - 5.11 MIL/uL   Hemoglobin 8.2 (L) 12.0 - 15.0 g/dL   HCT 30.1 (L) 36.0 - 46.0 %   MCV 65.7 (L) 78.0 - 100.0 fL   MCH 17.9 (L) 26.0 - 34.0 pg   MCHC 27.2 (L) 30.0 - 36.0 g/dL   RDW 20.6 (H) 11.5 - 15.5 %   Platelets 616 (H) 150 - 400 K/uL   Neutrophils Relative % 69 %   Lymphocytes Relative 7 %   Monocytes Relative 2 %   Eosinophils Relative 0 %   Basophils Relative 0 %   Band Neutrophils 22 %   Metamyelocytes Relative 0 %   Myelocytes 0 %   Promyelocytes Absolute 0 %   Blasts 0 %   nRBC 0 0 /100 WBC   Other 0 %   WBC Morphology INCREASED BANDS (>20% BANDS)    Smear Review LARGE PLATELETS PRESENT     Comment: PLATELET CLUMPS NOTED ON SMEAR   Neutro Abs 17.6 (H) 1.7 - 7.7 K/uL   Lymphs Abs 1.4 0.7 - 4.0 K/uL   Monocytes Absolute 0.4 0.1 -  1.0 K/uL   Eosinophils Absolute 0.0 0.0 - 0.7 K/uL   Basophils Absolute 0.0 0.0 - 0.1 K/uL  I-stat chem 8, ed     Status: Abnormal   Collection Time: 04/25/16  5:50 PM  Result Value Ref Range   Sodium 134 (L) 135 - 145 mmol/L   Potassium 4.2 3.5 - 5.1 mmol/L   Chloride 98 (L) 101 - 111 mmol/L   BUN 9 6 - 20 mg/dL   Creatinine, Ser 0.70 0.44 - 1.00 mg/dL   Glucose, Bld 133 (H) 65 - 99 mg/dL   Calcium, Ion 1.07 (L) 1.15 - 1.40 mmol/L   TCO2 26 0 - 100 mmol/L   Hemoglobin 10.2 (L) 12.0 - 15.0 g/dL  HCT 30.0 (L) 36.0 - 46.0 %  I-Stat CG4 Lactic Acid, ED  (not at  [facility])     Status: Abnormal   Collection Time: 04/25/16  5:51 PM  Result Value Ref Range   Lactic Acid, Venous 3.28 (HH) 0.5 - 1.9 mmol/L   Comment NOTIFIED PHYSICIAN   Blood Culture (routine x 2)     Status: None (Preliminary result)   Collection Time: 04/25/16  5:54 PM  Result Value Ref Range   Specimen Description BLOOD RIGHT WRIST    Special Requests IN PEDIATRIC BOTTLE Blood Culture adequate volume    Culture NO GROWTH < 24 HOURS    Report Status PENDING   Blood Culture (routine x 2)     Status: None (Preliminary result)   Collection Time: 04/25/16  5:58 PM  Result Value Ref Range   Specimen Description BLOOD RIGHT ANTECUBITAL    Special Requests      BOTTLES DRAWN AEROBIC AND ANAEROBIC Blood Culture adequate volume   Culture NO GROWTH < 24 HOURS    Report Status PENDING   Protime-INR     Status: Abnormal   Collection Time: 04/25/16  7:20 PM  Result Value Ref Range   Prothrombin Time 17.1 (H) 11.4 - 15.2 seconds   INR 1.38   Type and screen     Status: None (Preliminary result)   Collection Time: 04/25/16  7:27 PM  Result Value Ref Range   ABO/RH(D) O NEG    Antibody Screen NEG    Sample Expiration 04/28/2016    Unit Number [id number]    Blood Component Type RED CELLS,LR    Unit division 00    Status of Unit ALLOCATED    Transfusion Status OK TO TRANSFUSE    Crossmatch Result Compatible   ABO/Rh      Status: None   Collection Time: 04/25/16  7:27 PM  Result Value Ref Range   ABO/RH(D) O NEG   I-Stat CG4 Lactic Acid, ED  (not at  [facility])     Status: None   Collection Time: 04/25/16  8:51 PM  Result Value Ref Range   Lactic Acid, Venous 1.84 0.5 - 1.9 mmol/L  HIV antibody (Routine Testing)     Status: None   Collection Time: 04/25/16  9:25 PM  Result Value Ref Range   HIV Screen 4th Generation wRfx Non Reactive Non Reactive    Comment: (NOTE) Performed At: [facility] [address] [place], [place] [id number] Lindon Romp MD JQ:[id number]   Procalcitonin     Status: None   Collection Time: 04/25/16  9:25 PM  Result Value Ref Range   Procalcitonin 0.53 ng/mL    Comment:        Interpretation: PCT > 0.5 ng/mL and <= 2 ng/mL: Systemic infection (sepsis) is possible, but other conditions are known to elevate PCT as well. (NOTE)         ICU PCT Algorithm               Non ICU PCT Algorithm    ----------------------------     ------------------------------         PCT < 0.25 ng/mL                 PCT < 0.1 ng/mL     Stopping of antibiotics            Stopping of antibiotics       strongly encouraged.  strongly encouraged.    ----------------------------     ------------------------------       PCT level decrease by               PCT < 0.25 ng/mL       >= 80% from peak PCT       OR PCT 0.25 - 0.5 ng/mL          Stopping of antibiotics                                             encouraged.     Stopping of antibiotics           encouraged.    ----------------------------     ------------------------------       PCT level decrease by              PCT >= 0.25 ng/mL       < 80% from peak PCT        AND PCT >= 0.5 ng/mL             Continuing antibiotics                                              encouraged.       Continuing antibiotics            encouraged.    ----------------------------     ------------------------------     PCT level increase  compared          PCT > 0.5 ng/mL         with peak PCT AND          PCT >= 0.5 ng/mL             Escalation of antibiotics                                          strongly encouraged.      Escalation of antibiotics        strongly encouraged.   MRSA PCR Screening     Status: None   Collection Time: 04/25/16 10:25 PM  Result Value Ref Range   MRSA by PCR NEGATIVE NEGATIVE    Comment:        The GeneXpert MRSA Assay (FDA approved for NASAL specimens only), is one component of a comprehensive MRSA colonization surveillance program. It is not intended to diagnose MRSA infection nor to guide or monitor treatment for MRSA infections.   Lactic acid, plasma     Status: None   Collection Time: 04/25/16 10:54 PM  Result Value Ref Range   Lactic Acid, Venous 1.9 0.5 - 1.9 mmol/L  Vitamin B12     Status: Abnormal   Collection Time: 04/25/16 10:54 PM  Result Value Ref Range   Vitamin B-12 175 (L) 180 - 914 pg/mL    Comment: (NOTE) This assay is not validated for testing neonatal or myeloproliferative syndrome specimens for Vitamin B12 levels.   Folate     Status: None   Collection Time: 04/25/16 10:54 PM  Result Value Ref Range   Folate 9.0 >5.9 ng/mL  Iron and  TIBC     Status: Abnormal   Collection Time: 04/25/16 10:54 PM  Result Value Ref Range   Iron 5 (L) 28 - 170 ug/dL   TIBC 134 (L) 250 - 450 ug/dL   Saturation Ratios 4 (L) 10.4 - 31.8 %   UIBC 129 ug/dL  Ferritin     Status: None   Collection Time: 04/25/16 10:54 PM  Result Value Ref Range   Ferritin 98 11 - 307 ng/mL  Reticulocytes     Status: None   Collection Time: 04/25/16 10:54 PM  Result Value Ref Range   Retic Ct Pct 2.8 0.4 - 3.1 %   RBC. 4.30 3.87 - 5.11 MIL/uL   Retic Count, Manual 120.4 19.0 - 186.0 K/uL  CBC with Differential     Status: Abnormal   Collection Time: 04/26/16  3:30 AM  Result Value Ref Range   WBC 25.2 (H) 4.0 - 10.5 K/uL   RBC 4.35 3.87 - 5.11 MIL/uL   Hemoglobin 7.6 (L) 12.0 - 15.0  g/dL    Comment: REPEATED TO VERIFY SPECIMEN CHECKED FOR CLOTS DELTA CHECK NOTED    HCT 28.6 (L) 36.0 - 46.0 %   MCV 65.7 (L) 78.0 - 100.0 fL   MCH 17.5 (L) 26.0 - 34.0 pg   MCHC 26.6 (L) 30.0 - 36.0 g/dL   RDW 19.9 (H) 11.5 - 15.5 %   Platelets 457 (H) 150 - 400 K/uL    Comment: PLATELET COUNT CONFIRMED BY SMEAR   Neutrophils Relative % 93 %   Lymphocytes Relative 3 %   Monocytes Relative 4 %   Eosinophils Relative 0 %   Basophils Relative 0 %   Neutro Abs 23.4 (H) 1.7 - 7.7 K/uL   Lymphs Abs 0.8 0.7 - 4.0 K/uL   Monocytes Absolute 1.0 0.1 - 1.0 K/uL   Eosinophils Absolute 0.0 0.0 - 0.7 K/uL   Basophils Absolute 0.0 0.0 - 0.1 K/uL   RBC Morphology POLYCHROMASIA PRESENT    WBC Morphology MILD LEFT SHIFT (1-5% METAS, OCC MYELO, OCC BANDS)    Smear Review LARGE PLATELETS PRESENT     Comment: PLATELET CLUMPS NOTED ON SMEAR, COUNT APPEARS ADEQUATE  Basic metabolic panel     Status: Abnormal   Collection Time: 04/26/16  3:30 AM  Result Value Ref Range   Sodium 136 135 - 145 mmol/L   Potassium 4.2 3.5 - 5.1 mmol/L   Chloride 105 101 - 111 mmol/L   CO2 21 (L) 22 - 32 mmol/L   Glucose, Bld 140 (H) 65 - 99 mg/dL   BUN 12 6 - 20 mg/dL   Creatinine, Ser 0.99 0.44 - 1.00 mg/dL   Calcium 7.3 (L) 8.9 - 10.3 mg/dL   GFR calc non Af Amer >60 >60 mL/min   GFR calc Af Amer >60 >60 mL/min    Comment: (NOTE) The eGFR has been calculated using the CKD EPI equation. This calculation has not been validated in all clinical situations. eGFR's persistently <60 mL/min signify possible Chronic Kidney Disease.    Anion gap 10 5 - 15  Urinalysis, Routine w reflex microscopic (not at [facility])     Status: Abnormal   Collection Time: 04/26/16  5:30 AM  Result Value Ref Range   Color, Urine AMBER (A) YELLOW    Comment: BIOCHEMICALS MAY BE AFFECTED BY COLOR   APPearance CLEAR CLEAR   Specific Gravity, Urine >1.046 (H) 1.005 - 1.030   pH 5.0 5.0 - 8.0   Glucose, UA NEGATIVE  NEGATIVE mg/dL   Hgb urine  dipstick NEGATIVE NEGATIVE   Bilirubin Urine NEGATIVE NEGATIVE   Ketones, ur NEGATIVE NEGATIVE mg/dL   Protein, ur NEGATIVE NEGATIVE mg/dL   Nitrite NEGATIVE NEGATIVE   Leukocytes, UA NEGATIVE NEGATIVE  Glucose, capillary     Status: Abnormal   Collection Time: 04/26/16  8:04 AM  Result Value Ref Range   Glucose-Capillary 141 (H) 65 - 99 mg/dL  Hepatic function panel     Status: Abnormal   Collection Time: 04/26/16  8:41 AM  Result Value Ref Range   Total Protein 5.0 (L) 6.5 - 8.1 g/dL   Albumin 1.6 (L) 3.5 - 5.0 g/dL   AST 14 (L) 15 - 41 U/L   ALT 9 (L) 14 - 54 U/L   Alkaline Phosphatase 67 38 - 126 U/L   Total Bilirubin 1.3 (H) 0.3 - 1.2 mg/dL   Bilirubin, Direct 0.6 (H) 0.1 - 0.5 mg/dL   Indirect Bilirubin 0.7 0.3 - 0.9 mg/dL  Lactate dehydrogenase (pleural or peritoneal fluid)     Status: Abnormal   Collection Time: 04/26/16  1:36 PM  Result Value Ref Range   LD, Fluid 4,811 (H) 3 - 23 U/L    Comment: RESULTS CONFIRMED BY MANUAL DILUTION (NOTE) Results should be evaluated in conjunction with serum values    Fluid Type-FLDH Peritoneal   Body fluid cell count with differential     Status: Abnormal   Collection Time: 04/26/16  1:36 PM  Result Value Ref Range   Fluid Type-FCT Peritoneal    Color, Fluid RED (A) YELLOW   Appearance, Fluid CLOUDY (A) CLEAR   WBC, Fluid 12,625 (H) 0 - 1,000 cu mm   Neutrophil Count, Fluid 91 (H) 0 - 25 %   Lymphs, Fluid 3 %   Monocyte-Macrophage-Serous Fluid 6 (L) 50 - 90 %   Eos, Fluid 0 %   Other Cells, Fluid  %    BACTERIA PRESENT PLEASE CORRELATE WITH MICROBIOLOGY    Comment: RESULT CALLED TO, READ BACK BY AND VERIFIED WITH: A SHYSHKO,RN 04/26/16 1757 RHOLMES   Albumin, pleural or peritoneal fluid     Status: None   Collection Time: 04/26/16  1:36 PM  Result Value Ref Range   Albumin, Fluid 1.3 g/dL    Comment: (NOTE) No normal range established for this test Results should be evaluated in conjunction with serum values    Fluid  Type-FALB Peritoneal   Protein, pleural or peritoneal fluid     Status: None   Collection Time: 04/26/16  1:36 PM  Result Value Ref Range   Total protein, fluid 3.2 g/dL    Comment: (NOTE) No normal range established for this test Results should be evaluated in conjunction with serum values    Fluid Type-FTP Peritoneal   Glucose, pleural or peritoneal fluid     Status: None   Collection Time: 04/26/16  1:36 PM  Result Value Ref Range   Glucose, Fluid 79 mg/dL    Comment: (NOTE) No normal range established for this test Results should be evaluated in conjunction with serum values    Fluid Type-FGLU Peritoneal   Gram stain     Status: None   Collection Time: 04/26/16  1:36 PM  Result Value Ref Range   Specimen Description FLUID    Special Requests PERITONEAL    Gram Stain      WBC PRESENT, PREDOMINANTLY PMN GRAM POSITIVE COCCI IN CLUSTERS IN PAIRS    Report Status 04/26/2016 FINAL   CBC  with Differential/Platelet     Status: Abnormal   Collection Time: 04/27/16  3:09 AM  Result Value Ref Range   WBC 33.8 (H) 4.0 - 10.5 K/uL   RBC 3.93 3.87 - 5.11 MIL/uL   Hemoglobin 7.1 (L) 12.0 - 15.0 g/dL   HCT 26.1 (L) 36.0 - 46.0 %   MCV 66.4 (L) 78.0 - 100.0 fL   MCH 18.1 (L) 26.0 - 34.0 pg   MCHC 27.2 (L) 30.0 - 36.0 g/dL   RDW 20.3 (H) 11.5 - 15.5 %   Platelets 427 (H) 150 - 400 K/uL   Neutrophils Relative % 95 %   Lymphocytes Relative 2 %   Monocytes Relative 3 %   Eosinophils Relative 0 %   Basophils Relative 0 %   Neutro Abs 32.1 (H) 1.7 - 7.7 K/uL   Lymphs Abs 0.7 0.7 - 4.0 K/uL   Monocytes Absolute 1.0 0.1 - 1.0 K/uL   Eosinophils Absolute 0.0 0.0 - 0.7 K/uL   Basophils Absolute 0.0 0.0 - 0.1 K/uL   RBC Morphology ELLIPTOCYTES     Comment: POLYCHROMASIA PRESENT  TSH     Status: None   Collection Time: 04/27/16  3:09 AM  Result Value Ref Range   TSH 1.816 0.350 - 4.500 uIU/mL    Comment: Performed by a 3rd Generation assay with a functional sensitivity of <=0.01  uIU/mL.  Lactic acid, plasma     Status: None   Collection Time: 04/27/16  3:09 AM  Result Value Ref Range   Lactic Acid, Venous 1.4 0.5 - 1.9 mmol/L  Comprehensive metabolic panel     Status: Abnormal   Collection Time: 04/27/16  3:09 AM  Result Value Ref Range   Sodium 135 135 - 145 mmol/L   Potassium 4.9 3.5 - 5.1 mmol/L   Chloride 105 101 - 111 mmol/L   CO2 18 (L) 22 - 32 mmol/L   Glucose, Bld 156 (H) 65 - 99 mg/dL   BUN 27 (H) 6 - 20 mg/dL   Creatinine, Ser 1.22 (H) 0.44 - 1.00 mg/dL   Calcium 7.9 (L) 8.9 - 10.3 mg/dL   Total Protein 5.1 (L) 6.5 - 8.1 g/dL   Albumin 2.1 (L) 3.5 - 5.0 g/dL   AST 19 15 - 41 U/L   ALT 9 (L) 14 - 54 U/L   Alkaline Phosphatase 63 38 - 126 U/L   Total Bilirubin 1.3 (H) 0.3 - 1.2 mg/dL   GFR calc non Af Amer 47 (L) >60 mL/min   GFR calc Af Amer 55 (L) >60 mL/min    Comment: (NOTE) The eGFR has been calculated using the CKD EPI equation. This calculation has not been validated in all clinical situations. eGFR's persistently <60 mL/min signify possible Chronic Kidney Disease.    Anion gap 12 5 - 15  Troponin I     Status: Abnormal   Collection Time: 04/27/16  3:09 AM  Result Value Ref Range   Troponin I 0.11 (HH) <0.03 ng/mL    Comment: CRITICAL RESULT CALLED TO, READ BACK BY AND VERIFIED WITHLoretha Brasil [id number] WILDERK   Prepare RBC     Status: None   Collection Time: 04/27/16  7:04 AM  Result Value Ref Range   Order Confirmation ORDER PROCESSED BY BLOOD BANK    Ct Angio Chest Pe W Or Wo Contrast  Result Date: 04/27/2016 CLINICAL DATA:  61 year old female with history of ovarian cancer presenting with tachycardia. EXAM: CT ANGIOGRAPHY CHEST WITH CONTRAST TECHNIQUE: Multidetector  CT imaging of the chest was performed using the standard protocol during bolus administration of intravenous contrast. Multiplanar CT image reconstructions and MIPs were obtained to evaluate the vascular anatomy. CONTRAST:  100 cc Isovue 370 COMPARISON:   Chest radiograph dated 04/25/2016 and CT of the abdomen pelvis dated 04/25/2016 FINDINGS: Cardiovascular: There is mild cardiomegaly. No pericardial effusion. There is coronary vascular calcification involving the LAD. The thoracic aorta appears unremarkable. The origins of the great vessels of the aortic arch appear patent. There is dilatation of the main pulmonary trunk suggestive of underlying pulmonary hypertension. Evaluation of the pulmonary arteries is limited due to suboptimal opacification of the peripheral branches. No central pulmonary artery embolus identified. Mediastinum/Nodes: There is no hilar or mediastinal adenopathy. The esophagus and the thyroid gland are grossly unremarkable. Lungs/Pleura: There is a small left pleural effusion. There are consolidative changes of the left lower lobe as well as scattered densities in the left upper lobe. Overall there is volume loss in the left lung with shift of the heart into the left hemithorax. There is segmental air trapping in the right middle lobe with diffuse hazy airspace density in the right upper and right lower lobes. Paramediastinal subpleural density in the right upper lobe likely represents atelectatic changes or pleural thickening. There is no pneumothorax. Upper Abdomen: Ascites is partially visualized. Musculoskeletal: Multilevel degenerative changes of the spine. No acute osseous pathology. Review of the MIP images confirms the above findings. IMPRESSION: 1. No CT evidence of pulmonary artery embolus. 2. Areas of consolidative change involving the left lower lobe as well as scattered pulmonary densities in the left upper lobe. Findings may represent pneumonia. Correlation with clinical exam recommended. 3. Overall decreased left lung volume with leftward shift of the mediastinum and heart. 4. Segmental air trapping in the right middle lobe with diffuse haziness of the right upper and right lower lobes. 5. Ascites. Electronically Signed   By:  Anner Crete M.D.   On: 04/27/2016 05:40   Ct Abdomen Pelvis W Contrast  Result Date: 04/25/2016 CLINICAL DATA:  61 year old female with history of abdominal distension and nausea. No recent vomiting. EXAM: CT ABDOMEN AND PELVIS WITH CONTRAST TECHNIQUE: Multidetector CT imaging of the abdomen and pelvis was performed using the standard protocol following bolus administration of intravenous contrast. CONTRAST:  1 ISOVUE-300 IOPAMIDOL (ISOVUE-300) INJECTION 61% COMPARISON:  No priors. FINDINGS: Lower chest: Air trapping right middle lobe. Small left pleural effusion. Atherosclerotic calcifications in the left anterior descending and left circumflex coronary arteries. Hepatobiliary: No suspicious cystic or solid hepatic lesions. No intra or extrahepatic biliary ductal dilatation. Small calcified gallstones lying dependently in the gallbladder. No findings to suggest an acute cholecystitis at this time. Pancreas: No pancreatic mass. No pancreatic ductal dilatation. No pancreatic or peripancreatic fluid or inflammatory changes. Spleen: Unremarkable. Adrenals/Urinary Tract: Right kidney and bilateral adrenal glands are normal in appearance. 1.9 cm simple cyst in the interpolar region of the left kidney. No hydroureteronephrosis. Urinary bladder is normal in appearance. Stomach/Bowel: The appearance of the stomach is normal. There is no pathologic dilatation of small bowel or colon. Appendix is not confidently visualized. Vascular/Lymphatic: No significant atherosclerotic disease, aneurysm or dissection identified in the abdominal or pelvic vasculature. No lymphadenopathy noted in the abdomen or pelvis. Reproductive: Large complex mixed cystic and solid mass which emanates from one or both of the adnexal regions, highly concerning for ovarian neoplasm. This measures approximately 20.5 x 23.1 x 24.6 cm (coronal image 46 of series 6 and sagittal image 65  of series 7), and has mixed cystic and solid components, with  some heterogeneous internal calcification. Uterus is also heterogeneous in appearance, with multiple densely calcified lesions, presumably multiple fibroids, largest of which measures 5.5 cm in the anterior aspect of the fundal region. Other: Large volume of ascites. Extensive nodular soft tissue thickening throughout the omentum, compatible with omental caking. Some of this extends through the umbilical region. No pneumoperitoneum. Musculoskeletal: There are no aggressive appearing lytic or blastic lesions noted in the visualized portions of the skeleton. IMPRESSION: 1. 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 Consultation with Gynecology is recommended. 2. Small left pleural effusion. 3. Aortic atherosclerosis, in addition to 2 vessel coronary artery disease. Please note that although the presence of coronary artery calcium documents the presence of coronary artery disease, the severity of this disease and any potential stenosis cannot be assessed on this non-gated CT examination. Assessment for potential risk factor modification, dietary therapy or pharmacologic therapy may be warranted, if clinically indicated. 4. Cholelithiasis. Electronically Signed   By: Vinnie Langton M.D.   On: 04/25/2016 18:59   US Paracentesis  Result Date: 04/26/2016 INDICATION: Patient with abdominal pain and large complex solid/ cystic adnexal mass, ascites, omental thickening concerning for ovarian cancer; request received for diagnostic and therapeutic paracentesis. EXAM: ULTRASOUND GUIDED DIAGNOSTIC AND THERAPEUTIC PARACENTESIS MEDICATIONS: None. COMPLICATIONS: None immediate. PROCEDURE: Informed written consent was obtained from the patient after a discussion of the risks, benefits and alternatives to treatment. A timeout was performed prior to the initiation of the procedure. Initial ultrasound scanning demonstrates a moderate  amount of ascites within the left mid to lower abdominal quadrant. The left mid to lower abdomen was prepped and draped in the usual sterile fashion. 1% lidocaine was used for local anesthesia. Following this, a Yueh catheter was introduced. An ultrasound image was saved for documentation purposes. The paracentesis was performed. The catheter was removed and a dressing was applied. The patient tolerated the procedure well without immediate post procedural complication. FINDINGS: A total of approximately 2.5 liters of turbid, bloody fluid was removed. Samples were sent to the laboratory as requested by the clinical team. IMPRESSION: Successful ultrasound-guided diagnostic and therapeutic paracentesis yielding 2.5 liters of peritoneal fluid. Read by: Rowe Robert, PA-C Electronically Signed   By: Corrie Mckusick D.O.   On: 04/26/2016 13:58   Dg Chest Port 1 View  Result Date: 04/25/2016 CLINICAL DATA:  Abdominal distension with fever EXAM: PORTABLE CHEST 1 VIEW COMPARISON:  None. FINDINGS: Low lung volumes. Non inclusion of the left CP angle. Borderline to mild cardiomegaly, exaggerated by low lung volume. Mild atherosclerosis. No focal consolidation. No pneumothorax. IMPRESSION: Low lung volumes, without infiltrate or edema. Borderline to mild cardiomegaly. Electronically Signed   By: Donavan Foil M.D.   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 GS w/ G+ cocci in pairs, leuks; Cx pending  - vitals improved - rate controlled, normotensive, and afebrile. - worsening leukocytosis 33.8 from 25.2; lactate normalized (1.4) - no clinical findings consistent with peritonitis on exam - no role for acute surgical intervention. No signs of free air or obstruction. Recommend continuation of IV antibiotics. Follow cultures. And GYN consult for recommendations regarding surgical  cytoreduction/chemotherapy.    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 CA 125, CA 19-9, CEA  pending   afib with RVR Anemia of chronic disease- on iron  Thrombocytosis    Jill Alexanders, [facility] 04/27/2016, 8:29 AM Pager: [phone] Consults: [phone] Mon-Fri 7:00 am-4:30 pm Sat-Sun 7:00 am-11:30 am

## 2016-04-27 NOTE — Evaluation (Signed)
Physical Therapy Evaluation Patient Details Name: Allison Haynes MRN: [id number] DOB: 1955-05-21 Today's Date: 04/27/2016   History of Present Illness  Ms. Allison Haynes is a 61 y.o. Female with no known medical problems prior to this hospital admission who presented to [facility] with severe abdominal pain and distention on 04/25/16.  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  Clinical Impression  Patient presents with decreased mobility due to weakness, pain and limited cardiopulmonary reserve.  Ambulated on 4L O2 with RW and minguard A.  SpO2 dropped to 71%, back up to 91% with standing rest and cues for PLB.  Previously pt independent living on her own and active.  She will benefit from skilled PT in the acute setting and follow up post acute inpatient rehab prior to d/c home as [place].      Follow Up Recommendations SNF    Equipment Recommendations  Rolling walker with 5" wheels    Recommendations for Other Services       Precautions / Restrictions Precautions Precautions: Fall      Mobility  Bed Mobility Overal bed mobility: Needs Assistance Bed Mobility: Supine to Sit     Supine to sit: Min assist     General bed mobility comments: lifting trunk due to heavy abdomen and pain across belly  Transfers Overall transfer level: Needs assistance Equipment used: Rolling walker (2 wheeled) Transfers: Sit to/from Stand Sit to Stand: Min guard         General transfer comment: cues for hand placement/safety  Ambulation/Gait Ambulation/Gait assistance: Min assist Ambulation Distance (Feet): 75 Feet Assistive device: Rolling walker (2 wheeled) Gait Pattern/deviations: Step-through pattern;Decreased stride length;Wide base of support     General Gait  Details: minguard for safety, slow pace and cues/assist for walker esp on turns  Stairs            Wheelchair Mobility    Modified Rankin (Stroke Patients Only)       Balance Overall balance assessment: Needs assistance   Sitting balance-Leahy Scale: Good       Standing balance-Leahy Scale: Fair Standing balance comment: static balance without UE support, for ambulation needed walker                             Pertinent Vitals/Pain Pain Assessment: 0-10 Pain Score: 4  Pain Location: across abdomen Pain Intervention(s): Monitored during session;RN gave pain meds during session    [place] expects to be discharged to:: Skilled nursing facility Living Arrangements: Other relatives   Type of Home: House Home Access: Stairs to enter   Technical brewer of Steps: 2 Home Layout: Multi-level;Bed/bath upstairs Home Equipment: None Additional Comments: reports she is from [place] and here now with family    Prior Function Level of Independence: Independent               Hand Dominance        Extremity/Trunk Assessment   Upper Extremity Assessment Upper Extremity Assessment: Defer to OT evaluation    Lower Extremity Assessment Lower Extremity Assessment: Generalized weakness       Communication   Communication: No difficulties  Cognition Arousal/Alertness: Awake/alert Behavior During Therapy: WFL for tasks assessed/performed Overall Cognitive Status: Within Functional Limits for tasks assessed  General Comments      Exercises     Assessment/Plan    PT Assessment Patient needs continued PT services  PT Problem List Decreased strength;Decreased balance;Decreased activity tolerance;Decreased knowledge of use of DME;Decreased mobility;Pain;Decreased knowledge of precautions;Cardiopulmonary status limiting activity       PT Treatment Interventions DME  instruction;Gait training;Balance training;Functional mobility training;Therapeutic exercise;Patient/family education;Therapeutic activities    PT Goals (Current goals can be found in the Care Plan section)  Acute Rehab PT Goals Patient Stated Goal: To move around PT Goal Formulation: With patient Time For Goal Achievement: 05/11/16 Potential to Achieve Goals: Fair    Frequency Min 3X/week   Barriers to discharge        Co-evaluation               End of Session Equipment Utilized During Treatment: Oxygen Activity Tolerance: Patient limited by fatigue Patient left: in chair;with call bell/phone within reach;with chair alarm set   PT Visit Diagnosis: Difficulty in walking, not elsewhere classified (R26.2);Muscle weakness (generalized) (M62.81)    Time: 1421-1450 PT Time Calculation (min) (ACUTE ONLY): 29 min   Charges:   PT Evaluation $PT Eval Moderate Complexity: 1 Procedure PT Treatments $Gait Training: 8-22 mins   PT G CodesMagda Kiel, [place] [phone] 04/27/2016   Reginia Naas 04/27/2016, 4:55 PM

## 2016-04-27 NOTE — Progress Notes (Signed)
Initial Nutrition Assessment  DOCUMENTATION CODES:   Non-severe (moderate) malnutrition in context of chronic illness  INTERVENTION:   Premier Protein TID, each supplement provides 160kcal and 30g protein.   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  GOAL:   Patient will meet greater than or equal to 90% of their needs  MONITOR:   PO intake, Supplement acceptance, Labs, I & O's  REASON FOR ASSESSMENT:   Consult Assessment of nutrition requirement/status  ASSESSMENT:   61 y.o. Female with no known medical problems prior to this hospital admission who presented to [facility] with severe abdominal pain and distention on 04/25/16. She reported a history of intermittent abdominal discomfort and distention for about a month prior to presentation. 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 The patient was admitted for pain control and evaluation by Gyn Onc. She underwent paracentesis on 4/22 and gram stain reveals gram positive cocci in pairs   Met with pt in room today. Pt reports normal appetite up until May 2018 when she started to notice that she would get full after eating only about half of what she would normally eat. Pt has noticed a gradual increase in the size of her abdomen over the past month. Pt reports that she is unsure of how much she weighs and unsure if she has lost any weight. Pt reports that she has weighed anywhere from 160-200lbs in the past few years. RD unsure of pt's true weight r/t ascites. RD suspects that pt has lost weight but unsure as to the amount or  severity. Pt with mild to moderate muscle wasting in clavicles and shoulder regions; no fat depletion noted. Pt is currently eating about 50% of meals; lunch today included a bowl of beef soup. RD discussed with pt, the importance of adequate protein intake given her increased estimated needs and the possibility of surgery +/- chemo in the future. Pt would like to try premier protein; RD will order.   Medications reviewed and include: Vit B-12, lovenox, ferrous sulfate, miralax, zosyn, vancomycin, hydromorphone, phenergan   Labs reviewed: BUN 27(H), creat 1.22(H), Ca 7.9(L) adj. 9.42 wnl, alb 2.1(L), tbili 1.3(H) Iron 5(L), TIBC 134(L) Wbc- 33.8(H), Hgb 7.1(L), Hct 26.1(L) cbgs- 130, 133, 140, 156 x 48 hrs  Nutrition-Focused physical exam completed. Findings are no fat depletion, mild to moderate muscle depletion in clavicles and shoulder regions, and moderate edema. Pt noted to have ascites. Unable to assess lower extremities today.   Diet Order:  Diet Heart Room service appropriate? Yes; Fluid consistency: Thin  Skin:  Reviewed, no issues (ascites)  Last BM:  4/21  Height:   Ht Readings from Last 1 Encounters:  04/25/16 5' 5"  (1.651 m)    Weight:   Wt Readings from Last 1 Encounters:  04/27/16 194 lb 14.2 oz (88.4 kg)    Ideal Body Weight:  56.8 kg  BMI:  Body mass index is 32.43 kg/m.  Estimated Nutritional Needs:   Kcal:  1700-2000kcal/day   Protein:  102-114g/day   Fluid:  >1.7L/day   EDUCATION NEEDS:   No education needs identified at this time  [place], RD, LDN Pager #[phone]

## 2016-04-27 NOTE — Progress Notes (Signed)
PHARMACY - PHYSICIAN COMMUNICATION CRITICAL VALUE ALERT - BLOOD CULTURE IDENTIFICATION (BCID)  Results for orders placed or performed during the hospital encounter of 04/25/16  Blood Culture ID Panel (Reflexed) (Collected: 04/25/2016  5:58 PM)  Result Value Ref Range   Enterococcus species NOT DETECTED NOT DETECTED   Listeria monocytogenes NOT DETECTED NOT DETECTED   Staphylococcus species DETECTED (A) NOT DETECTED   Staphylococcus aureus DETECTED (A) NOT DETECTED   Methicillin resistance NOT DETECTED NOT DETECTED   Streptococcus species NOT DETECTED NOT DETECTED   Streptococcus agalactiae NOT DETECTED NOT DETECTED   Streptococcus pneumoniae NOT DETECTED NOT DETECTED   Streptococcus pyogenes NOT DETECTED NOT DETECTED   Acinetobacter baumannii NOT DETECTED NOT DETECTED   Enterobacteriaceae species NOT DETECTED NOT DETECTED   Enterobacter cloacae complex NOT DETECTED NOT DETECTED   Escherichia coli NOT DETECTED NOT DETECTED   Klebsiella oxytoca NOT DETECTED NOT DETECTED   Klebsiella pneumoniae NOT DETECTED NOT DETECTED   Proteus species NOT DETECTED NOT DETECTED   Serratia marcescens NOT DETECTED NOT DETECTED   Haemophilus influenzae NOT DETECTED NOT DETECTED   Neisseria meningitidis NOT DETECTED NOT DETECTED   Pseudomonas aeruginosa NOT DETECTED NOT DETECTED   Candida albicans NOT DETECTED NOT DETECTED   Candida glabrata NOT DETECTED NOT DETECTED   Candida krusei NOT DETECTED NOT DETECTED   Candida parapsilosis NOT DETECTED NOT DETECTED   Candida tropicalis NOT DETECTED NOT DETECTED    Name of physician (or Provider) Contacted: Dr. Sloan Leiter   Changes to prescribed antibiotics required: continue Zosyn and vancomycin. ID to see.   Duayne Cal 04/27/2016  12:29 PM

## 2016-04-27 NOTE — Progress Notes (Signed)
  Echocardiogram 2D Echocardiogram has been performed.  Allison Haynes 04/27/2016, 12:27 PM

## 2016-04-27 NOTE — Progress Notes (Signed)
PROGRESS NOTE        PATIENT DETAILS Name: Allison Haynes Age: 61 y.o. Sex: female Date of Birth: 06-30-1955 Admit Date: 04/25/2016 Admitting Physician Vianne Bulls, MD PCP:No primary care provider on file.  Brief Narrative: Patient is a 61 y.o. female with no significant medical history-but has not seen a physician for the past number of years -presented to the ED on 4/21 with worsening abdominal pain and distention. 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 Patient was started on broad spectrum antibiotics and admitted for further eval and treatment. She underwent paracentesis on 4/22-which showed significant leukocytosis (WBC 12,625) and gram  + cocci in clusters in pairs. Hospital course has been complicated by transient Afib RVR. See below for further details.  Subjective: Looks much more comfortable this am-her abdominal pain although present-has improved-she is now able to lie on her back (couldn't yesterday)  No Chest pain No SOB  Tele-Afib RVR last night (reviewed)  Assessment/Plan: Sepsis secondary to?Spontaneous peritonitis of malignant ascitis: improving-although her leukocytosis has worsened-she is now afebrile and has stable vitals this am.Patient's abdominal exam has actually improved compared to yesterday. Source of infection is likely ascites fluid/mets ?superinfection-as ascitic fluid with >12 K WBC and positive gram stain for gram + cocci in clusters. Discussed with Radiology Dr Burney Gauze over the phone-who reviewed CT abd from yesterday-no evidence of perforation. Have reached out to GYN on call this am-recommendations were to continue with Abx-and await recs from GYN Oncology. Have also discussed case with surgery on call-who will review patient's chart and will call me back.Will keep NPO for now. Blood cultures negative so far-await culture results  from ascitic fluid.   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 She still has some distention and mild diffuse tenderness-but her belly is much less tense than yesterday. Await ascitic fluid cytology and cultures. See above regarding concern for infection.  Afib RVR: occurred this am-required Cardizem and Amiodarone infusion-now back in sinus rhythm.Will start IV lopressor scheduled dosing-check Echo. Chads2vasc score of 1-since concern for peritonitis-and may need sugery-no Heparin.  AKI: likely due to hemodynamic mediated injury in a setting of sepsis-supportive care for now-trend lytes  Anemia: Anemia panel consistent with anemia of chronic disease-her iron levels are significantly low-on start supplementation. Hb with further decrease today-will go ahead and transfuse one unit PRBC today.  Thrombocytosis: Likely reactive-due to underlying malignancy.  DVT Prophylaxis: Prophylactic Lovenox   Code Status: Full code   Family Communication: None at bedside  Disposition Plan: Remain inpatient-remain in SDU  Antimicrobial agents: Anti-infectives    Start     Dose/Rate Route Frequency Ordered Stop   04/26/16 1700  vancomycin (VANCOCIN) 1,250 mg in sodium chloride 0.9 % 250 mL IVPB     1,250 mg 166.7 mL/hr over 90 Minutes Intravenous Every 12 hours 04/26/16 0908     04/26/16 0200  piperacillin-tazobactam (ZOSYN) IVPB 3.375 g     3.375 g 12.5 mL/hr over 240 Minutes Intravenous Every 8 hours 04/25/16 1827     04/25/16 2300  vancomycin (VANCOCIN) IVPB 1000 mg/200 mL premix  Status:  Discontinued     1,000 mg 200 mL/hr over 60 Minutes  Intravenous Every 8 hours 04/25/16 2247 04/26/16 0908   04/25/16 1745  piperacillin-tazobactam (ZOSYN) IVPB 3.375 g     3.375 g 100 mL/hr over 30 Minutes Intravenous  Once 04/25/16 1738 04/25/16 1854       Procedures: None  CONSULTS:  GYN Oncology  Time spent: 40-minutes-Greater than 50% of this time was spent in counseling, explanation of diagnosis, planning of further management, and coordination of care.  MEDICATIONS: Scheduled Meds: . acetaminophen  650 mg Rectal Once  . acetaminophen  650 mg Oral Once  . cyanocobalamin  1,000 mcg Subcutaneous Daily  . diphenhydrAMINE  25 mg Intravenous Once  . enoxaparin (LOVENOX) injection  40 mg Subcutaneous Q24H  . ferrous sulfate  325 mg Oral BID WC  . metoprolol  2.5 mg Intravenous Q6H  . polyethylene glycol  17 g Oral Daily  . sodium chloride flush  3 mL Intravenous Q12H   Continuous Infusions: . sodium chloride    . albumin human    . amiodarone Stopped (04/27/16 0500)   Followed by  . amiodarone    . piperacillin-tazobactam (ZOSYN)  IV 3.375 g (04/27/16 1761)  . vancomycin Stopped (04/27/16 6073)   PRN Meds:.acetaminophen **OR** acetaminophen, albumin human, HYDROmorphone (DILAUDID) injection, promethazine   PHYSICAL EXAM: Vital signs: Vitals:   04/27/16 0500 04/27/16 0530 04/27/16 0600 04/27/16 0630  BP: (!) 135/53 (!) 111/46 (!) 122/48 (!) 132/45  Pulse: 85 78 78 80  Resp: (!) 24 16 16 16   Temp:      TempSrc:      SpO2: (!) 75% 95% 97% 97%  Weight: 88.4 kg (194 lb 14.2 oz)     Height:       Filed Weights   04/25/16 1938 04/25/16 2200 04/27/16 0500  Weight: 100 kg (220 lb 7.4 oz) 90.8 kg (200 lb 2.8 oz) 88.4 kg (194 lb 14.2 oz)   Body mass index is 32.43 kg/m.   General appearance :Awake and alert-much more comfortable today.chronically sick appearing HEENT: atraumatic and normocephalic Neck: No JVD Resp:Moving air bilaterally-no added sound anteriorly CVS:S2 S2 regular  GI: less distended compared to yesterday-umblicus everted. Still with diffuse tenderness-but much less compared to yesterday (now allows me to examine due to less pain) Extremities: + edema bilaterally Neurology: Non  focal. Musculoskeletal:No cyanosis Skin:No rash, skin is warm and dry Wounds:none  I have personally reviewed following labs and imaging studies  LABORATORY DATA: CBC:  Recent Labs Lab 04/25/16 1745 04/25/16 1750 04/26/16 0330 04/27/16 0309  WBC 19.4*  --  25.2* 33.8*  NEUTROABS 17.6*  --  23.4* 32.1*  HGB 8.2* 10.2* 7.6* 7.1*  HCT 30.1* 30.0* 28.6* 26.1*  MCV 65.7*  --  65.7* 66.4*  PLT 616*  --  457* 427*    Basic Metabolic Panel:  Recent Labs Lab 04/25/16 1745 04/25/16 1750 04/26/16 0330 04/27/16 0309  NA 134* 134* 136 135  K 4.2 4.2 4.2 4.9  CL 99* 98* 105 105  CO2 22  --  21* 18*  GLUCOSE 130* 133* 140* 156*  BUN 8 9 12  27*  CREATININE 0.71 0.70 0.99 1.22*  CALCIUM 7.9*  --  7.3* 7.9*    GFR: Estimated Creatinine Clearance: 53.9 mL/min (A) (by C-G formula based on SCr of 1.22 mg/dL (H)).  Liver Function Tests:  Recent Labs Lab 04/25/16 1745 04/26/16 0841 04/27/16 0309  AST 24 14* 19  ALT 12* 9* 9*  ALKPHOS 91 67 63  BILITOT 1.1 1.3* 1.3*  PROT 5.7* 5.0* 5.1*  ALBUMIN 2.0* 1.6* 2.1*   No results for input(s): LIPASE, AMYLASE in the last 168 hours. No results for input(s): AMMONIA in the last 168 hours.  Coagulation Profile:  Recent Labs Lab 04/25/16 1920  INR 1.38    Cardiac Enzymes:  Recent Labs Lab 04/27/16 0309  TROPONINI 0.11*    BNP (last 3 results) No results for input(s): PROBNP in the last 8760 hours.  HbA1C: No results for input(s): HGBA1C in the last 72 hours.  CBG:  Recent Labs Lab 04/26/16 0804  GLUCAP 141*    Lipid Profile: No results for input(s): CHOL, HDL, LDLCALC, TRIG, CHOLHDL, LDLDIRECT in the last 72 hours.  Thyroid Function Tests:  Recent Labs  04/27/16 0309  TSH 1.816    Anemia Panel:  Recent Labs  04/25/16 2254  VITAMINB12 175*  FOLATE 9.0  FERRITIN 98  TIBC 134*  IRON 5*  RETICCTPCT 2.8    Urine analysis:    Component Value Date/Time   COLORURINE AMBER (A) 04/26/2016 0530    APPEARANCEUR CLEAR 04/26/2016 0530   LABSPEC >1.046 (H) 04/26/2016 0530   PHURINE 5.0 04/26/2016 0530   GLUCOSEU NEGATIVE 04/26/2016 0530   HGBUR NEGATIVE 04/26/2016 0530   BILIRUBINUR NEGATIVE 04/26/2016 0530   KETONESUR NEGATIVE 04/26/2016 0530   PROTEINUR NEGATIVE 04/26/2016 0530   NITRITE NEGATIVE 04/26/2016 0530   LEUKOCYTESUR NEGATIVE 04/26/2016 0530    Sepsis Labs: Lactic Acid, Venous    Component Value Date/Time   LATICACIDVEN 1.4 04/27/2016 0309    MICROBIOLOGY: Recent Results (from the past 240 hour(s))  Blood Culture (routine x 2)     Status: None (Preliminary result)   Collection Time: 04/25/16  5:54 PM  Result Value Ref Range Status   Specimen Description BLOOD RIGHT WRIST  Final   Special Requests IN PEDIATRIC BOTTLE Blood Culture adequate volume  Final   Culture NO GROWTH < 24 HOURS  Final   Report Status PENDING  Incomplete  Blood Culture (routine x 2)     Status: None (Preliminary result)   Collection Time: 04/25/16  5:58 PM  Result Value Ref Range Status   Specimen Description BLOOD RIGHT ANTECUBITAL  Final   Special Requests   Final    BOTTLES DRAWN AEROBIC AND ANAEROBIC Blood Culture adequate volume   Culture NO GROWTH < 24 HOURS  Final   Report Status PENDING  Incomplete  MRSA PCR Screening     Status: None   Collection Time: 04/25/16 10:25 PM  Result Value Ref Range Status   MRSA by PCR NEGATIVE NEGATIVE Final    Comment:        The GeneXpert MRSA Assay (FDA approved for NASAL specimens only), is one component of a comprehensive MRSA colonization surveillance program. It is not intended to diagnose MRSA infection nor to guide or monitor treatment for MRSA infections.   Gram stain     Status: None   Collection Time: 04/26/16  1:36 PM  Result Value Ref Range Status   Specimen Description FLUID  Final   Special Requests PERITONEAL  Final   Gram Stain   Final    WBC PRESENT, PREDOMINANTLY PMN GRAM POSITIVE COCCI IN CLUSTERS IN PAIRS     Report Status 04/26/2016 FINAL  Final    RADIOLOGY STUDIES/RESULTS: Ct Angio Chest Pe W Or Wo Contrast  Result Date: 04/27/2016 CLINICAL DATA:  61 year old female with history of ovarian cancer presenting with tachycardia. EXAM: CT ANGIOGRAPHY CHEST WITH CONTRAST TECHNIQUE: Multidetector CT imaging of the chest was performed using  the standard protocol during bolus administration of intravenous contrast. Multiplanar CT image reconstructions and MIPs were obtained to evaluate the vascular anatomy. CONTRAST:  100 cc Isovue 370 COMPARISON:  Chest radiograph dated 04/25/2016 and CT of the abdomen pelvis dated 04/25/2016 FINDINGS: Cardiovascular: There is mild cardiomegaly. No pericardial effusion. There is coronary vascular calcification involving the LAD. The thoracic aorta appears unremarkable. The origins of the great vessels of the aortic arch appear patent. There is dilatation of the main pulmonary trunk suggestive of underlying pulmonary hypertension. Evaluation of the pulmonary arteries is limited due to suboptimal opacification of the peripheral branches. No central pulmonary artery embolus identified. Mediastinum/Nodes: There is no hilar or mediastinal adenopathy. The esophagus and the thyroid gland are grossly unremarkable. Lungs/Pleura: There is a small left pleural effusion. There are consolidative changes of the left lower lobe as well as scattered densities in the left upper lobe. Overall there is volume loss in the left lung with shift of the heart into the left hemithorax. There is segmental air trapping in the right middle lobe with diffuse hazy airspace density in the right upper and right lower lobes. Paramediastinal subpleural density in the right upper lobe likely represents atelectatic changes or pleural thickening. There is no pneumothorax. Upper Abdomen: Ascites is partially visualized. Musculoskeletal: Multilevel degenerative changes of the spine. No acute osseous pathology. Review of the  MIP images confirms the above findings. IMPRESSION: 1. No CT evidence of pulmonary artery embolus. 2. Areas of consolidative change involving the left lower lobe as well as scattered pulmonary densities in the left upper lobe. Findings may represent pneumonia. Correlation with clinical exam recommended. 3. Overall decreased left lung volume with leftward shift of the mediastinum and heart. 4. Segmental air trapping in the right middle lobe with diffuse haziness of the right upper and right lower lobes. 5. Ascites. Electronically Signed   By: Anner Crete M.D.   On: 04/27/2016 05:40   Ct Abdomen Pelvis W Contrast  Result Date: 04/25/2016 CLINICAL DATA:  61 year old female with history of abdominal distension and nausea. No recent vomiting. EXAM: CT ABDOMEN AND PELVIS WITH CONTRAST TECHNIQUE: Multidetector CT imaging of the abdomen and pelvis was performed using the standard protocol following bolus administration of intravenous contrast. CONTRAST:  1 ISOVUE-300 IOPAMIDOL (ISOVUE-300) INJECTION 61% COMPARISON:  No priors. FINDINGS: Lower chest: Air trapping right middle lobe. Small left pleural effusion. Atherosclerotic calcifications in the left anterior descending and left circumflex coronary arteries. Hepatobiliary: No suspicious cystic or solid hepatic lesions. No intra or extrahepatic biliary ductal dilatation. Small calcified gallstones lying dependently in the gallbladder. No findings to suggest an acute cholecystitis at this time. Pancreas: No pancreatic mass. No pancreatic ductal dilatation. No pancreatic or peripancreatic fluid or inflammatory changes. Spleen: Unremarkable. Adrenals/Urinary Tract: Right kidney and bilateral adrenal glands are normal in appearance. 1.9 cm simple cyst in the interpolar region of the left kidney. No hydroureteronephrosis. Urinary bladder is normal in appearance. Stomach/Bowel: The appearance of the stomach is normal. There is no pathologic dilatation of small bowel or  colon. Appendix is not confidently visualized. Vascular/Lymphatic: No significant atherosclerotic disease, aneurysm or dissection identified in the abdominal or pelvic vasculature. No lymphadenopathy noted in the abdomen or pelvis. Reproductive: Large complex mixed cystic and solid mass which emanates from one or both of the adnexal regions, highly concerning for ovarian neoplasm. This measures approximately 20.5 x 23.1 x 24.6 cm (coronal image 46 of series 6 and sagittal image 65 of series 7), and has mixed cystic and  solid components, with some heterogeneous internal calcification. Uterus is also heterogeneous in appearance, with multiple densely calcified lesions, presumably multiple fibroids, largest of which measures 5.5 cm in the anterior aspect of the fundal region. Other: Large volume of ascites. Extensive nodular soft tissue thickening throughout the omentum, compatible with omental caking. Some of this extends through the umbilical region. No pneumoperitoneum. Musculoskeletal: There are no aggressive appearing lytic or blastic lesions noted in the visualized portions of the skeleton. IMPRESSION: 1. 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 Consultation with Gynecology is recommended. 2. Small left pleural effusion. 3. Aortic atherosclerosis, in addition to 2 vessel coronary artery disease. Please note that although the presence of coronary artery calcium documents the presence of coronary artery disease, the severity of this disease and any potential stenosis cannot be assessed on this non-gated CT examination. Assessment for potential risk factor modification, dietary therapy or pharmacologic therapy may be warranted, if clinically indicated. 4. Cholelithiasis. Electronically Signed   By: Vinnie Langton M.D.   On: 04/25/2016 18:59   US Paracentesis  Result Date: 04/26/2016 INDICATION: Patient with abdominal  pain and large complex solid/ cystic adnexal mass, ascites, omental thickening concerning for ovarian cancer; request received for diagnostic and therapeutic paracentesis. EXAM: ULTRASOUND GUIDED DIAGNOSTIC AND THERAPEUTIC PARACENTESIS MEDICATIONS: None. COMPLICATIONS: None immediate. PROCEDURE: Informed written consent was obtained from the patient after a discussion of the risks, benefits and alternatives to treatment. A timeout was performed prior to the initiation of the procedure. Initial ultrasound scanning demonstrates a moderate amount of ascites within the left mid to lower abdominal quadrant. The left mid to lower abdomen was prepped and draped in the usual sterile fashion. 1% lidocaine was used for local anesthesia. Following this, a Yueh catheter was introduced. An ultrasound image was saved for documentation purposes. The paracentesis was performed. The catheter was removed and a dressing was applied. The patient tolerated the procedure well without immediate post procedural complication. FINDINGS: A total of approximately 2.5 liters of turbid, bloody fluid was removed. Samples were sent to the laboratory as requested by the clinical team. IMPRESSION: Successful ultrasound-guided diagnostic and therapeutic paracentesis yielding 2.5 liters of peritoneal fluid. Read by: Rowe Robert, PA-C Electronically Signed   By: Corrie Mckusick D.O.   On: 04/26/2016 13:58   Dg Chest Port 1 View  Result Date: 04/25/2016 CLINICAL DATA:  Abdominal distension with fever EXAM: PORTABLE CHEST 1 VIEW COMPARISON:  None. FINDINGS: Low lung volumes. Non inclusion of the left CP angle. Borderline to mild cardiomegaly, exaggerated by low lung volume. Mild atherosclerosis. No focal consolidation. No pneumothorax. IMPRESSION: Low lung volumes, without infiltrate or edema. Borderline to mild cardiomegaly. Electronically Signed   By: Donavan Foil M.D.   On: 04/25/2016 17:54     LOS: 2 days   Oren Binet, MD  [facility] Pager:[phone]  If 7PM-7AM, please contact night-coverage [email or web address] Password [facility] 04/27/2016, 7:33 AM

## 2016-04-28 ENCOUNTER — Inpatient Hospital Stay (HOSPITAL_COMMUNITY): Payer: Medicaid Other

## 2016-04-28 ENCOUNTER — Encounter (HOSPITAL_COMMUNITY): Payer: Self-pay | Admitting: Student

## 2016-04-28 DIAGNOSIS — E440 Moderate protein-calorie malnutrition: Secondary | ICD-10-CM

## 2016-04-28 DIAGNOSIS — R7881 Bacteremia: Secondary | ICD-10-CM

## 2016-04-28 DIAGNOSIS — D649 Anemia, unspecified: Secondary | ICD-10-CM

## 2016-04-28 DIAGNOSIS — E46 Unspecified protein-calorie malnutrition: Secondary | ICD-10-CM

## 2016-04-28 DIAGNOSIS — R1907 Generalized intra-abdominal and pelvic swelling, mass and lump: Secondary | ICD-10-CM

## 2016-04-28 HISTORY — PX: IR PARACENTESIS: IMG2679

## 2016-04-28 LAB — BODY FLUID CELL COUNT WITH DIFFERENTIAL
Lymphs, Fluid: 6 %
MONOCYTE-MACROPHAGE-SEROUS FLUID: 17 % — AB (ref 50–90)
Neutrophil Count, Fluid: 77 % — ABNORMAL HIGH (ref 0–25)
Total Nucleated Cell Count, Fluid: 16459 cu mm — ABNORMAL HIGH (ref 0–1000)

## 2016-04-28 LAB — GLUCOSE, CAPILLARY: Glucose-Capillary: 118 mg/dL — ABNORMAL HIGH (ref 65–99)

## 2016-04-28 LAB — CBC WITH DIFFERENTIAL/PLATELET
BASOS ABS: 0 10*3/uL (ref 0.0–0.1)
Basophils Relative: 0 %
EOS ABS: 0 10*3/uL (ref 0.0–0.7)
Eosinophils Relative: 0 %
HCT: 24.8 % — ABNORMAL LOW (ref 36.0–46.0)
Hemoglobin: 7.1 g/dL — ABNORMAL LOW (ref 12.0–15.0)
Lymphocytes Relative: 3 %
Lymphs Abs: 0.8 10*3/uL (ref 0.7–4.0)
MCH: 19 pg — ABNORMAL LOW (ref 26.0–34.0)
MCHC: 28.6 g/dL — AB (ref 30.0–36.0)
MCV: 66.5 fL — ABNORMAL LOW (ref 78.0–100.0)
MONO ABS: 0.3 10*3/uL (ref 0.1–1.0)
Monocytes Relative: 1 %
Neutro Abs: 26 10*3/uL — ABNORMAL HIGH (ref 1.7–7.7)
Neutrophils Relative %: 96 %
PLATELETS: 366 10*3/uL (ref 150–400)
RBC: 3.73 MIL/uL — ABNORMAL LOW (ref 3.87–5.11)
RDW: 21.3 % — ABNORMAL HIGH (ref 11.5–15.5)
WBC: 27.1 10*3/uL — AB (ref 4.0–10.5)

## 2016-04-28 LAB — CEA: CEA: 1.1 ng/mL (ref 0.0–4.7)

## 2016-04-28 LAB — TYPE AND SCREEN
ABO/RH(D): O NEG
Antibody Screen: NEGATIVE
UNIT DIVISION: 0

## 2016-04-28 LAB — COMPREHENSIVE METABOLIC PANEL
ALT: 10 U/L — AB (ref 14–54)
AST: 14 U/L — AB (ref 15–41)
Albumin: 1.9 g/dL — ABNORMAL LOW (ref 3.5–5.0)
Alkaline Phosphatase: 78 U/L (ref 38–126)
Anion gap: 10 (ref 5–15)
BILIRUBIN TOTAL: 1 mg/dL (ref 0.3–1.2)
BUN: 30 mg/dL — AB (ref 6–20)
CALCIUM: 8 mg/dL — AB (ref 8.9–10.3)
CO2: 22 mmol/L (ref 22–32)
CREATININE: 0.96 mg/dL (ref 0.44–1.00)
Chloride: 101 mmol/L (ref 101–111)
GFR calc Af Amer: >60 mL/min (ref >60)
Glucose, Bld: 127 mg/dL — ABNORMAL HIGH (ref 65–99)
Potassium: 4.4 mmol/L (ref 3.5–5.1)
Sodium: 133 mmol/L — ABNORMAL LOW (ref 135–145)
TOTAL PROTEIN: 5.2 g/dL — AB (ref 6.5–8.1)

## 2016-04-28 LAB — BPAM RBC
Blood Product Expiration Date: 201805162359
ISSUE DATE / TIME: 201804230959
Unit Type and Rh: 9500

## 2016-04-28 LAB — CANCER ANTIGEN 19-9: CA 19 9: 14 U/mL (ref 0–35)

## 2016-04-28 MED ORDER — FUROSEMIDE 40 MG PO TABS
40.0000 mg | ORAL_TABLET | Freq: Every day | ORAL | Status: DC
  Administered 2016-04-28 – 2016-04-29 (×2): 40 mg via ORAL
  Filled 2016-04-28 (×2): qty 1

## 2016-04-28 MED ORDER — LIDOCAINE HCL 1 % IJ SOLN
INTRAMUSCULAR | Status: AC
  Filled 2016-04-28: qty 20

## 2016-04-28 MED ORDER — POTASSIUM CHLORIDE CRYS ER 20 MEQ PO TBCR
20.0000 meq | EXTENDED_RELEASE_TABLET | Freq: Every day | ORAL | Status: DC
Start: 2016-04-28 — End: 2016-05-04
  Administered 2016-04-28 – 2016-05-03 (×6): 20 meq via ORAL
  Filled 2016-04-28 (×6): qty 1

## 2016-04-28 NOTE — Progress Notes (Signed)
Patient daughter and son at bedside, plan of care reviewed with all three. Advanced Directives green packet given to patients daughter to review.   Patient is resting comfortably in chair since change of shift report from day shift to night shift. Patient is able to ambulate with assistance of Dedrick Heffner and 1 staff assist between [facility], chair, and bed.

## 2016-04-28 NOTE — Procedures (Signed)
PROCEDURE SUMMARY:  Successful US guided paracentesis from right lateral abdomen.  Yielded 530 mL of amber, thick fluid.  No immediate complications.  Pt tolerated well. Procedure was stopped prior to removal of all fluid due to complexity and thickness of fluid.  Specimen was sent for labs.  Docia Barrier PA-C 04/28/2016 11:47 AM

## 2016-04-28 NOTE — Progress Notes (Signed)
INFECTIOUS DISEASE PROGRESS NOTE  ID: Allison Haynes is a 60 y.o. female with  Principal Problem:   Sepsis ([place]) Active Problems:   Ovarian cancer ([place])   Malignant ascites   Microcytic anemia   Thrombocytosis ([facility])   Hyponatremia   Severe malnutrition ([facility])   Malnutrition of moderate degree  Subjective: Resting quietly  Abtx:  Anti-infectives    Start     Dose/Rate Route Frequency Ordered Stop   04/28/16 0500  vancomycin (VANCOCIN) 1,500 mg in sodium chloride 0.9 % 500 mL IVPB  Status:  Discontinued     1,500 mg 250 mL/hr over 120 Minutes Intravenous Every 24 hours 04/27/16 0908 04/27/16 1544   04/27/16 1700  ceFAZolin (ANCEF) IVPB 1 g/50 mL premix  Status:  Discontinued     1 g 100 mL/hr over 30 Minutes Intravenous Every 8 hours 04/27/16 1544 04/27/16 1548   04/27/16 1700  ceFAZolin (ANCEF) IVPB 2g/100 mL premix     2 g 200 mL/hr over 30 Minutes Intravenous Every 8 hours 04/27/16 1548     04/26/16 1700  vancomycin (VANCOCIN) 1,250 mg in sodium chloride 0.9 % 250 mL IVPB  Status:  Discontinued     1,250 mg 166.7 mL/hr over 90 Minutes Intravenous Every 12 hours 04/26/16 0908 04/27/16 0908   04/26/16 0200  piperacillin-tazobactam (ZOSYN) IVPB 3.375 g  Status:  Discontinued     3.375 g 12.5 mL/hr over 240 Minutes Intravenous Every 8 hours 04/25/16 1827 04/27/16 1544   04/25/16 2300  vancomycin (VANCOCIN) IVPB 1000 mg/200 mL premix  Status:  Discontinued     1,000 mg 200 mL/hr over 60 Minutes Intravenous Every 8 hours 04/25/16 2247 04/26/16 0908   04/25/16 1745  piperacillin-tazobactam (ZOSYN) IVPB 3.375 g     3.375 g 100 mL/hr over 30 Minutes Intravenous  Once 04/25/16 1738 04/25/16 1854      Medications:  Scheduled: . acetaminophen  650 mg Rectal Once  . cyanocobalamin  1,000 mcg Subcutaneous Daily  . enoxaparin (LOVENOX) injection  40 mg Subcutaneous Q24H  . ferrous sulfate  325 mg Oral BID WC  . furosemide  40 mg Oral Daily  . lidocaine      . metoprolol tartrate   25 mg Oral Q8H  . multivitamin with minerals  1 tablet Oral Daily  . polyethylene glycol  17 g Oral Daily  . potassium chloride  20 mEq Oral Daily  . protein supplement shake  11 oz Oral TID BM  . sodium chloride flush  3 mL Intravenous Q12H    Objective: Vital signs in last 24 hours: Temp:  [97.3 F (36.3 C)-98.9 F (37.2 C)] 97.3 F (36.3 C) (04/24 0840) Pulse Rate:  [71-112] 73 (04/24 0840) Resp:  [17-24] 18 (04/24 0840) BP: (106-172)/(38-83) 106/74 (04/24 0840) SpO2:  [94 %-100 %] 100 % (04/24 0840) Weight:  [91.6 kg (201 lb 15.1 oz)] 91.6 kg (201 lb 15.1 oz) (04/24 0500)   General appearance: fatigued and no distress Resp: clear to auscultation bilaterally Cardio: regular rate and rhythm GI: abnormal findings:  distended, hypoactive bowel sounds and tender  Lab Results  Recent Labs  04/27/16 0309 04/28/16 0414  WBC 33.8* 27.1*  HGB 7.1* 7.1*  HCT 26.1* 24.8*  NA 135 133*  K 4.9 4.4  CL 105 101  CO2 18* 22  BUN 27* 30*  CREATININE 1.22* 0.96   Liver Panel  Recent Labs  04/26/16 0841 04/27/16 0309 04/28/16 0414  PROT 5.0* 5.1* 5.2*  ALBUMIN 1.6* 2.1*  1.9*  AST 14* 19 14*  ALT 9* 9* 10*  ALKPHOS 67 63 78  BILITOT 1.3* 1.3* 1.0  BILIDIR 0.6*  --   --   IBILI 0.7  --   --    Sedimentation Rate No results for input(s): ESRSEDRATE in the last 72 hours. C-Reactive Protein No results for input(s): CRP in the last 72 hours.  Microbiology: Recent Results (from the past 240 hour(s))  Blood Culture (routine x 2)     Status: None (Preliminary result)   Collection Time: 04/25/16  5:54 PM  Result Value Ref Range Status   Specimen Description BLOOD RIGHT WRIST  Final   Special Requests IN PEDIATRIC BOTTLE Blood Culture adequate volume  Final   Culture NO GROWTH 2 DAYS  Final   Report Status PENDING  Incomplete  Blood Culture (routine x 2)     Status: Abnormal (Preliminary result)   Collection Time: 04/25/16  5:58 PM  Result Value Ref Range Status    Specimen Description BLOOD RIGHT ANTECUBITAL  Final   Special Requests   Final    BOTTLES DRAWN AEROBIC AND ANAEROBIC Blood Culture adequate volume   Culture  Setup Time   Final    GRAM POSITIVE COCCI IN CLUSTERS AEROBIC BOTTLE ONLY Organism ID to follow CRITICAL RESULT CALLED TO, READ BACK BY AND VERIFIED WITH: AElta [place].D. 12:00 04/27/16 ([email or web address])    Culture (A)  Final    STAPHYLOCOCCUS AUREUS SUSCEPTIBILITIES TO FOLLOW    Report Status PENDING  Incomplete  Blood Culture ID Panel (Reflexed)     Status: Abnormal   Collection Time: 04/25/16  5:58 PM  Result Value Ref Range Status   Enterococcus species NOT DETECTED NOT DETECTED Final   Listeria monocytogenes NOT DETECTED NOT DETECTED Final   Staphylococcus species DETECTED (A) NOT DETECTED Final    Comment: CRITICAL RESULT CALLED TO, READ BACK BY AND VERIFIED WITH: AElta [place].D. 12:00 04/27/16 ([email or web address])    Staphylococcus aureus DETECTED (A) NOT DETECTED Final    Comment: Methicillin (oxacillin) susceptible Staphylococcus aureus (MSSA). Preferred therapy is anti staphylococcal beta lactam antibiotic (Cefazolin or Nafcillin), unless clinically contraindicated. CRITICAL RESULT CALLED TO, READ BACK BY AND VERIFIED WITH: AElta [place].D. 12:00 04/27/16 ([email or web address])    Methicillin resistance NOT DETECTED NOT DETECTED Final   Streptococcus species NOT DETECTED NOT DETECTED Final   Streptococcus agalactiae NOT DETECTED NOT DETECTED Final   Streptococcus pneumoniae NOT DETECTED NOT DETECTED Final   Streptococcus pyogenes NOT DETECTED NOT DETECTED Final   Acinetobacter baumannii NOT DETECTED NOT DETECTED Final   Enterobacteriaceae species NOT DETECTED NOT DETECTED Final   Enterobacter cloacae complex NOT DETECTED NOT DETECTED Final   Escherichia coli NOT DETECTED NOT DETECTED Final   Klebsiella oxytoca NOT DETECTED NOT DETECTED Final   Klebsiella pneumoniae NOT DETECTED NOT DETECTED Final   Proteus species NOT DETECTED NOT  DETECTED Final   Serratia marcescens NOT DETECTED NOT DETECTED Final   Haemophilus influenzae NOT DETECTED NOT DETECTED Final   Neisseria meningitidis NOT DETECTED NOT DETECTED Final   Pseudomonas aeruginosa NOT DETECTED NOT DETECTED Final   Candida albicans NOT DETECTED NOT DETECTED Final   Candida glabrata NOT DETECTED NOT DETECTED Final   Candida krusei NOT DETECTED NOT DETECTED Final   Candida parapsilosis NOT DETECTED NOT DETECTED Final   Candida tropicalis NOT DETECTED NOT DETECTED Final  MRSA PCR Screening     Status: None   Collection Time: 04/25/16 10:25 PM  Result Value Ref Range Status  MRSA by PCR NEGATIVE NEGATIVE Final    Comment:        The GeneXpert MRSA Assay (FDA approved for NASAL specimens only), is one component of a comprehensive MRSA colonization surveillance program. It is not intended to diagnose MRSA infection nor to guide or monitor treatment for MRSA infections.   Urine culture     Status: None   Collection Time: 04/26/16  5:30 AM  Result Value Ref Range Status   Specimen Description URINE, RANDOM  Final   Special Requests NONE  Final   Culture NO GROWTH  Final   Report Status 04/27/2016 FINAL  Final  Culture, body fluid-bottle     Status: None (Preliminary result)   Collection Time: 04/26/16  1:36 PM  Result Value Ref Range Status   Specimen Description FLUID  Final   Special Requests PERITONEAL  Final   Culture NO GROWTH < 24 HOURS  Final   Report Status PENDING  Incomplete  Gram stain     Status: None   Collection Time: 04/26/16  1:36 PM  Result Value Ref Range Status   Specimen Description FLUID  Final   Special Requests PERITONEAL  Final   Gram Stain   Final    WBC PRESENT, PREDOMINANTLY PMN GRAM POSITIVE COCCI IN CLUSTERS IN PAIRS    Report Status 04/26/2016 FINAL  Final    Studies/Results: Ct Angio Chest Pe W Or Wo Contrast  Result Date: 04/27/2016 CLINICAL DATA:  61 year old female with history of ovarian cancer presenting  with tachycardia. EXAM: CT ANGIOGRAPHY CHEST WITH CONTRAST TECHNIQUE: Multidetector CT imaging of the chest was performed using the standard protocol during bolus administration of intravenous contrast. Multiplanar CT image reconstructions and MIPs were obtained to evaluate the vascular anatomy. CONTRAST:  100 cc Isovue 370 COMPARISON:  Chest radiograph dated 04/25/2016 and CT of the abdomen pelvis dated 04/25/2016 FINDINGS: Cardiovascular: There is mild cardiomegaly. No pericardial effusion. There is coronary vascular calcification involving the LAD. The thoracic aorta appears unremarkable. The origins of the great vessels of the aortic arch appear patent. There is dilatation of the main pulmonary trunk suggestive of underlying pulmonary hypertension. Evaluation of the pulmonary arteries is limited due to suboptimal opacification of the peripheral branches. No central pulmonary artery embolus identified. Mediastinum/Nodes: There is no hilar or mediastinal adenopathy. The esophagus and the thyroid gland are grossly unremarkable. Lungs/Pleura: There is a small left pleural effusion. There are consolidative changes of the left lower lobe as well as scattered densities in the left upper lobe. Overall there is volume loss in the left lung with shift of the heart into the left hemithorax. There is segmental air trapping in the right middle lobe with diffuse hazy airspace density in the right upper and right lower lobes. Paramediastinal subpleural density in the right upper lobe likely represents atelectatic changes or pleural thickening. There is no pneumothorax. Upper Abdomen: Ascites is partially visualized. Musculoskeletal: Multilevel degenerative changes of the spine. No acute osseous pathology. Review of the MIP images confirms the above findings. IMPRESSION: 1. No CT evidence of pulmonary artery embolus. 2. Areas of consolidative change involving the left lower lobe as well as scattered pulmonary densities in the  left upper lobe. Findings may represent pneumonia. Correlation with clinical exam recommended. 3. Overall decreased left lung volume with leftward shift of the mediastinum and heart. 4. Segmental air trapping in the right middle lobe with diffuse haziness of the right upper and right lower lobes. 5. Ascites. Electronically Signed   By: Milas Hock  Radparvar M.D.   On: 04/27/2016 05:40   US Paracentesis  Result Date: 04/26/2016 INDICATION: Patient with abdominal pain and large complex solid/ cystic adnexal mass, ascites, omental thickening concerning for ovarian cancer; request received for diagnostic and therapeutic paracentesis. EXAM: ULTRASOUND GUIDED DIAGNOSTIC AND THERAPEUTIC PARACENTESIS MEDICATIONS: None. COMPLICATIONS: None immediate. PROCEDURE: Informed written consent was obtained from the patient after a discussion of the risks, benefits and alternatives to treatment. A timeout was performed prior to the initiation of the procedure. Initial ultrasound scanning demonstrates a moderate amount of ascites within the left mid to lower abdominal quadrant. The left mid to lower abdomen was prepped and draped in the usual sterile fashion. 1% lidocaine was used for local anesthesia. Following this, a Yueh catheter was introduced. An ultrasound image was saved for documentation purposes. The paracentesis was performed. The catheter was removed and a dressing was applied. The patient tolerated the procedure well without immediate post procedural complication. FINDINGS: A total of approximately 2.5 liters of turbid, bloody fluid was removed. Samples were sent to the laboratory as requested by the clinical team. IMPRESSION: Successful ultrasound-guided diagnostic and therapeutic paracentesis yielding 2.5 liters of peritoneal fluid. Read by: Rowe Robert, PA-C Electronically Signed   By: Corrie Mckusick D.O.   On: 04/26/2016 13:58     Assessment/Plan: MSSA bacteremia Repeat BCx sent this AM [facility] ED 60-65%  Pelvic  Mass Cytology- no malignant cells Tumor markers - CA 125- 227, CEA nl, CA-19-1 nl abd very distended and tender, would query if she needs debulking.  GYN f/u  Protein Calorie Malnutrition  Anemia HCT down, Hgb stable.   Total days of antibiotics: 4 ancef         Bobby Rumpf Infectious Diseases (pager) [phone] [email or web address] 04/28/2016, 11:03 AM  LOS: 3 days

## 2016-04-28 NOTE — Progress Notes (Addendum)
PROGRESS NOTE        PATIENT DETAILS Name: Allison Haynes Age: 61 y.o. Sex: female Date of Birth: May 20, 1955 Admit Date: 04/25/2016 Admitting Physician Vianne Bulls, MD PCP:No primary care provider on file.  Brief Narrative: Patient is a 61 y.o. female with no significant medical history-but has not seen a physician for the past number of years -presented to the ED on 4/21 with worsening abdominal pain and distention. 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 Patient was started on broad spectrum antibiotics and admitted for further eval and treatment. She underwent paracentesis on 4/22-which showed significant leukocytosis (WBC 12,625) and gram  + cocci in clusters in pairs. Blood cultures subsequently positive for MSSA. Hospital course has been complicated by transient Afib RVR. See below for further details.  Subjective: Abdomen is again significantly distended this morning.  He otherwise appears well-denies any chest pain or shortness of breath. Does acknowledge some abdominal discomfort.  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 Blood cultures on 4/23 came back positive for MSSA,gram stain on the ascites fluid positive for gram-positive cocci-cultures negative-but still not final yet. Seen by infectious disease, antibiotics have been narrowed down to Ancef. As noted on my note on 4/23, no evidence of bowel perforation on CT chest. Blood cultures repeated on 4/24. Continue stepdown monitoring, continue Ancef and follow leukocytosis trend.  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 Suspect she will need to be discharged to SNF and will need outpatient follow-up with oncology. Cytology from ascites fluid is still pending. Her belly is significantly distended again today, have placed an order for repeat paracenteses on 4/24. Not sure if diuretics will help in this setting-will try low-dose Lasix.  PAF:RVR Occurred on 4/23-on low-dose beta blocker- maintaining sinus rhythm. Echo on 4/23 showed EF around 60-65% with grade 2 diastolic dysfunction. She did have moderate pulmonary hypertension as well. Since Chads2vasc score of 1-will not anticoagulate-especially in the setting of severe anemia.  AKI: Resolved. Likely due to hemodynamic mediated injury in a setting of sepsis  Anemia: Anemia panel consistent with anemia of chronic disease-likely worsened by acute illness/sepsis-transfused 1 unit of PRBC on 4/23-without any significant increase in hemoglobin. Will repeat CBC tomorrow morning, if worse-will transfuse 1 unit. Her vitamin B12 levels are low-continue supplementation.  Thrombocytosis: Likely reactive-due to underlying malignancy.  Moderate protein calorie malnutrition: Seen by nutrition, continue supplements.  DVT Prophylaxis: Prophylactic Lovenox   Code Status: Full code   Family Communication: None at bedside-had spoken extensively with the patient's daughter yesterday.  Disposition Plan: Remain inpatient-remain in SDU  Antimicrobial agents: Anti-infectives    Start     Dose/Rate Route Frequency Ordered Stop   04/28/16 0500  vancomycin (VANCOCIN) 1,500 mg in sodium chloride 0.9 % 500 mL IVPB  Status:  Discontinued     1,500 mg 250 mL/hr over 120 Minutes Intravenous Every 24 hours 04/27/16 0908 04/27/16 1544  04/27/16 1700  ceFAZolin  (ANCEF) IVPB 1 g/50 mL premix  Status:  Discontinued     1 g 100 mL/hr over 30 Minutes Intravenous Every 8 hours 04/27/16 1544 04/27/16 1548   04/27/16 1700  ceFAZolin (ANCEF) IVPB 2g/100 mL premix     2 g 200 mL/hr over 30 Minutes Intravenous Every 8 hours 04/27/16 1548     04/26/16 1700  vancomycin (VANCOCIN) 1,250 mg in sodium chloride 0.9 % 250 mL IVPB  Status:  Discontinued     1,250 mg 166.7 mL/hr over 90 Minutes Intravenous Every 12 hours 04/26/16 0908 04/27/16 0908   04/26/16 0200  piperacillin-tazobactam (ZOSYN) IVPB 3.375 g  Status:  Discontinued     3.375 g 12.5 mL/hr over 240 Minutes Intravenous Every 8 hours 04/25/16 1827 04/27/16 1544   04/25/16 2300  vancomycin (VANCOCIN) IVPB 1000 mg/200 mL premix  Status:  Discontinued     1,000 mg 200 mL/hr over 60 Minutes Intravenous Every 8 hours 04/25/16 2247 04/26/16 0908   04/25/16 1745  piperacillin-tazobactam (ZOSYN) IVPB 3.375 g     3.375 g 100 mL/hr over 30 Minutes Intravenous  Once 04/25/16 1738 04/25/16 1854      Procedures: None  CONSULTS:  GYN Oncology  Time spent: 40-minutes-Greater than 50% of this time was spent in counseling, explanation of diagnosis, planning of further management, and coordination of care.  MEDICATIONS: Scheduled Meds: . lidocaine      . acetaminophen  650 mg Rectal Once  . cyanocobalamin  1,000 mcg Subcutaneous Daily  . enoxaparin (LOVENOX) injection  40 mg Subcutaneous Q24H  . ferrous sulfate  325 mg Oral BID WC  . furosemide  40 mg Oral Daily  . metoprolol tartrate  25 mg Oral Q8H  . multivitamin with minerals  1 tablet Oral Daily  . polyethylene glycol  17 g Oral Daily  . potassium chloride  20 mEq Oral Daily  . protein supplement shake  11 oz Oral TID BM  . sodium chloride flush  3 mL Intravenous Q12H   Continuous Infusions: . sodium chloride 75 mL/hr at 04/28/16 0657  .  ceFAZolin (ANCEF) IV Stopped (04/28/16 0209)   PRN Meds:.acetaminophen **OR** acetaminophen, HYDROmorphone  (DILAUDID) injection, metoprolol, promethazine   PHYSICAL EXAM: Vital signs: Vitals:   04/28/16 0401 04/28/16 0500 04/28/16 0652 04/28/16 0840  BP: 121/70  121/70 106/74  Pulse: 76  88 73  Resp: 18   18  Temp: 98.6 F (37 C)   97.3 F (36.3 C)  TempSrc: Oral   Oral  SpO2: 99%   100%  Weight:  91.6 kg (201 lb 15.1 oz)    Height:       Filed Weights   04/25/16 2200 04/27/16 0500 04/28/16 0500  Weight: 90.8 kg (200 lb 2.8 oz) 88.4 kg (194 lb 14.2 oz) 91.6 kg (201 lb 15.1 oz)   Body mass index is 33.6 kg/m.   General appearance :Awake, alert, not in any distress. Chronically ill appearing Eyes:, pupils equally reactive to light and accomodation,no scleral icterus. HEENT: Atraumatic and Normocephalic Neck: supple, no JVD. Resp:Good air entry bilaterally-added sounds CVS: S1 S2 regular, no murmurs.  GI: Bowel sounds present, very distended and tense today-slightly tender all over but no peritoneal signs. Fluid thrill positive. Very dull at the flanks.  Extremities: B/L Lower Ext shows + edema, both legs are warm to touch Neurology:  speech clear,Non focal, sensation is grossly intact. Psychiatric: Normal judgment and insight. Normal mood. Musculoskeletal:No digital cyanosis Skin:No Rash, warm  and dry Wounds:N/A  I have personally reviewed following labs and imaging studies  LABORATORY DATA: CBC:  Recent Labs Lab 04/25/16 1745 04/25/16 1750 04/26/16 0330 04/27/16 0309 04/28/16 0414  WBC 19.4*  --  25.2* 33.8* 27.1*  NEUTROABS 17.6*  --  23.4* 32.1* 26.0*  HGB 8.2* 10.2* 7.6* 7.1* 7.1*  HCT 30.1* 30.0* 28.6* 26.1* 24.8*  MCV 65.7*  --  65.7* 66.4* 66.5*  PLT 616*  --  457* 427* 536    Basic Metabolic Panel:  Recent Labs Lab 04/25/16 1745 04/25/16 1750 04/26/16 0330 04/27/16 0309 04/28/16 0414  NA 134* 134* 136 135 133*  K 4.2 4.2 4.2 4.9 4.4  CL 99* 98* 105 105 101  CO2 22  --  21* 18* 22  GLUCOSE 130* 133* 140* 156* 127*  BUN 8 9 12  27* 30*    CREATININE 0.71 0.70 0.99 1.22* 0.96  CALCIUM 7.9*  --  7.3* 7.9* 8.0*    GFR: Estimated Creatinine Clearance: 69.7 mL/min (by C-G formula based on SCr of 0.96 mg/dL).  Liver Function Tests:  Recent Labs Lab 04/25/16 1745 04/26/16 0841 04/27/16 0309 04/28/16 0414  AST 24 14* 19 14*  ALT 12* 9* 9* 10*  ALKPHOS 91 67 63 78  BILITOT 1.1 1.3* 1.3* 1.0  PROT 5.7* 5.0* 5.1* 5.2*  ALBUMIN 2.0* 1.6* 2.1* 1.9*   No results for input(s): LIPASE, AMYLASE in the last 168 hours. No results for input(s): AMMONIA in the last 168 hours.  Coagulation Profile:  Recent Labs Lab 04/25/16 1920  INR 1.38    Cardiac Enzymes:  Recent Labs Lab 04/27/16 0309  TROPONINI 0.11*    BNP (last 3 results) No results for input(s): PROBNP in the last 8760 hours.  HbA1C: No results for input(s): HGBA1C in the last 72 hours.  CBG:  Recent Labs Lab 04/26/16 0804 04/27/16 0745 04/28/16 0844  GLUCAP 141* 135* 118*    Lipid Profile: No results for input(s): CHOL, HDL, LDLCALC, TRIG, CHOLHDL, LDLDIRECT in the last 72 hours.  Thyroid Function Tests:  Recent Labs  04/27/16 0309  TSH 1.816    Anemia Panel:  Recent Labs  04/25/16 2254  VITAMINB12 175*  FOLATE 9.0  FERRITIN 98  TIBC 134*  IRON 5*  RETICCTPCT 2.8    Urine analysis:    Component Value Date/Time   COLORURINE AMBER (A) 04/26/2016 0530   APPEARANCEUR CLEAR 04/26/2016 0530   LABSPEC >1.046 (H) 04/26/2016 0530   PHURINE 5.0 04/26/2016 0530   GLUCOSEU NEGATIVE 04/26/2016 0530   HGBUR NEGATIVE 04/26/2016 0530   BILIRUBINUR NEGATIVE 04/26/2016 0530   KETONESUR NEGATIVE 04/26/2016 0530   PROTEINUR NEGATIVE 04/26/2016 0530   NITRITE NEGATIVE 04/26/2016 0530   LEUKOCYTESUR NEGATIVE 04/26/2016 0530    Sepsis Labs: Lactic Acid, Venous    Component Value Date/Time   LATICACIDVEN 1.4 04/27/2016 0309    MICROBIOLOGY: Recent Results (from the past 240 hour(s))  Blood Culture (routine x 2)     Status: None  (Preliminary result)   Collection Time: 04/25/16  5:54 PM  Result Value Ref Range Status   Specimen Description BLOOD RIGHT WRIST  Final   Special Requests IN PEDIATRIC BOTTLE Blood Culture adequate volume  Final   Culture NO GROWTH 2 DAYS  Final   Report Status PENDING  Incomplete  Blood Culture (routine x 2)     Status: Abnormal (Preliminary result)   Collection Time: 04/25/16  5:58 PM  Result Value Ref Range Status   Specimen Description BLOOD  RIGHT ANTECUBITAL  Final   Special Requests   Final    BOTTLES DRAWN AEROBIC AND ANAEROBIC Blood Culture adequate volume   Culture  Setup Time   Final    GRAM POSITIVE COCCI IN CLUSTERS AEROBIC BOTTLE ONLY Organism ID to follow CRITICAL RESULT CALLED TO, READ BACK BY AND VERIFIED WITH: AElta [place].D. 12:00 04/27/16 ([email or web address])    Culture (A)  Final    STAPHYLOCOCCUS AUREUS SUSCEPTIBILITIES TO FOLLOW    Report Status PENDING  Incomplete  Blood Culture ID Panel (Reflexed)     Status: Abnormal   Collection Time: 04/25/16  5:58 PM  Result Value Ref Range Status   Enterococcus species NOT DETECTED NOT DETECTED Final   Listeria monocytogenes NOT DETECTED NOT DETECTED Final   Staphylococcus species DETECTED (A) NOT DETECTED Final    Comment: CRITICAL RESULT CALLED TO, READ BACK BY AND VERIFIED WITH: AElta [place].D. 12:00 04/27/16 ([email or web address])    Staphylococcus aureus DETECTED (A) NOT DETECTED Final    Comment: Methicillin (oxacillin) susceptible Staphylococcus aureus (MSSA). Preferred therapy is anti staphylococcal beta lactam antibiotic (Cefazolin or Nafcillin), unless clinically contraindicated. CRITICAL RESULT CALLED TO, READ BACK BY AND VERIFIED WITH: AElta [place].D. 12:00 04/27/16 ([email or web address])    Methicillin resistance NOT DETECTED NOT DETECTED Final   Streptococcus species NOT DETECTED NOT DETECTED Final   Streptococcus agalactiae NOT DETECTED NOT DETECTED Final   Streptococcus pneumoniae NOT DETECTED NOT DETECTED Final    Streptococcus pyogenes NOT DETECTED NOT DETECTED Final   Acinetobacter baumannii NOT DETECTED NOT DETECTED Final   Enterobacteriaceae species NOT DETECTED NOT DETECTED Final   Enterobacter cloacae complex NOT DETECTED NOT DETECTED Final   Escherichia coli NOT DETECTED NOT DETECTED Final   Klebsiella oxytoca NOT DETECTED NOT DETECTED Final   Klebsiella pneumoniae NOT DETECTED NOT DETECTED Final   Proteus species NOT DETECTED NOT DETECTED Final   Serratia marcescens NOT DETECTED NOT DETECTED Final   Haemophilus influenzae NOT DETECTED NOT DETECTED Final   Neisseria meningitidis NOT DETECTED NOT DETECTED Final   Pseudomonas aeruginosa NOT DETECTED NOT DETECTED Final   Candida albicans NOT DETECTED NOT DETECTED Final   Candida glabrata NOT DETECTED NOT DETECTED Final   Candida krusei NOT DETECTED NOT DETECTED Final   Candida parapsilosis NOT DETECTED NOT DETECTED Final   Candida tropicalis NOT DETECTED NOT DETECTED Final  MRSA PCR Screening     Status: None   Collection Time: 04/25/16 10:25 PM  Result Value Ref Range Status   MRSA by PCR NEGATIVE NEGATIVE Final    Comment:        The GeneXpert MRSA Assay (FDA approved for NASAL specimens only), is one component of a comprehensive MRSA colonization surveillance program. It is not intended to diagnose MRSA infection nor to guide or monitor treatment for MRSA infections.   Urine culture     Status: None   Collection Time: 04/26/16  5:30 AM  Result Value Ref Range Status   Specimen Description URINE, RANDOM  Final   Special Requests NONE  Final   Culture NO GROWTH  Final   Report Status 04/27/2016 FINAL  Final  Culture, body fluid-bottle     Status: None (Preliminary result)   Collection Time: 04/26/16  1:36 PM  Result Value Ref Range Status   Specimen Description FLUID  Final   Special Requests PERITONEAL  Final   Culture NO GROWTH < 24 HOURS  Final   Report Status PENDING  Incomplete  Gram stain     Status: None  Collection  Time: 04/26/16  1:36 PM  Result Value Ref Range Status   Specimen Description FLUID  Final   Special Requests PERITONEAL  Final   Gram Stain   Final    WBC PRESENT, PREDOMINANTLY PMN GRAM POSITIVE COCCI IN CLUSTERS IN PAIRS    Report Status 04/26/2016 FINAL  Final    RADIOLOGY STUDIES/RESULTS: Ct Angio Chest Pe W Or Wo Contrast  Result Date: 04/27/2016 CLINICAL DATA:  61 year old female with history of ovarian cancer presenting with tachycardia. EXAM: CT ANGIOGRAPHY CHEST WITH CONTRAST TECHNIQUE: Multidetector CT imaging of the chest was performed using the standard protocol during bolus administration of intravenous contrast. Multiplanar CT image reconstructions and MIPs were obtained to evaluate the vascular anatomy. CONTRAST:  100 cc Isovue 370 COMPARISON:  Chest radiograph dated 04/25/2016 and CT of the abdomen pelvis dated 04/25/2016 FINDINGS: Cardiovascular: There is mild cardiomegaly. No pericardial effusion. There is coronary vascular calcification involving the LAD. The thoracic aorta appears unremarkable. The origins of the great vessels of the aortic arch appear patent. There is dilatation of the main pulmonary trunk suggestive of underlying pulmonary hypertension. Evaluation of the pulmonary arteries is limited due to suboptimal opacification of the peripheral branches. No central pulmonary artery embolus identified. Mediastinum/Nodes: There is no hilar or mediastinal adenopathy. The esophagus and the thyroid gland are grossly unremarkable. Lungs/Pleura: There is a small left pleural effusion. There are consolidative changes of the left lower lobe as well as scattered densities in the left upper lobe. Overall there is volume loss in the left lung with shift of the heart into the left hemithorax. There is segmental air trapping in the right middle lobe with diffuse hazy airspace density in the right upper and right lower lobes. Paramediastinal subpleural density in the right upper lobe  likely represents atelectatic changes or pleural thickening. There is no pneumothorax. Upper Abdomen: Ascites is partially visualized. Musculoskeletal: Multilevel degenerative changes of the spine. No acute osseous pathology. Review of the MIP images confirms the above findings. IMPRESSION: 1. No CT evidence of pulmonary artery embolus. 2. Areas of consolidative change involving the left lower lobe as well as scattered pulmonary densities in the left upper lobe. Findings may represent pneumonia. Correlation with clinical exam recommended. 3. Overall decreased left lung volume with leftward shift of the mediastinum and heart. 4. Segmental air trapping in the right middle lobe with diffuse haziness of the right upper and right lower lobes. 5. Ascites. Electronically Signed   By: Anner Crete M.D.   On: 04/27/2016 05:40   Ct Abdomen Pelvis W Contrast  Result Date: 04/25/2016 CLINICAL DATA:  61 year old female with history of abdominal distension and nausea. No recent vomiting. EXAM: CT ABDOMEN AND PELVIS WITH CONTRAST TECHNIQUE: Multidetector CT imaging of the abdomen and pelvis was performed using the standard protocol following bolus administration of intravenous contrast. CONTRAST:  1 ISOVUE-300 IOPAMIDOL (ISOVUE-300) INJECTION 61% COMPARISON:  No priors. FINDINGS: Lower chest: Air trapping right middle lobe. Small left pleural effusion. Atherosclerotic calcifications in the left anterior descending and left circumflex coronary arteries. Hepatobiliary: No suspicious cystic or solid hepatic lesions. No intra or extrahepatic biliary ductal dilatation. Small calcified gallstones lying dependently in the gallbladder. No findings to suggest an acute cholecystitis at this time. Pancreas: No pancreatic mass. No pancreatic ductal dilatation. No pancreatic or peripancreatic fluid or inflammatory changes. Spleen: Unremarkable. Adrenals/Urinary Tract: Right kidney and bilateral adrenal glands are normal in appearance.  1.9 cm simple cyst in the interpolar region of the left kidney. No  hydroureteronephrosis. Urinary bladder is normal in appearance. Stomach/Bowel: The appearance of the stomach is normal. There is no pathologic dilatation of small bowel or colon. Appendix is not confidently visualized. Vascular/Lymphatic: No significant atherosclerotic disease, aneurysm or dissection identified in the abdominal or pelvic vasculature. No lymphadenopathy noted in the abdomen or pelvis. Reproductive: Large complex mixed cystic and solid mass which emanates from one or both of the adnexal regions, highly concerning for ovarian neoplasm. This measures approximately 20.5 x 23.1 x 24.6 cm (coronal image 46 of series 6 and sagittal image 65 of series 7), and has mixed cystic and solid components, with some heterogeneous internal calcification. Uterus is also heterogeneous in appearance, with multiple densely calcified lesions, presumably multiple fibroids, largest of which measures 5.5 cm in the anterior aspect of the fundal region. Other: Large volume of ascites. Extensive nodular soft tissue thickening throughout the omentum, compatible with omental caking. Some of this extends through the umbilical region. No pneumoperitoneum. Musculoskeletal: There are no aggressive appearing lytic or blastic lesions noted in the visualized portions of the skeleton. IMPRESSION: 1. 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 Consultation with Gynecology is recommended. 2. Small left pleural effusion. 3. Aortic atherosclerosis, in addition to 2 vessel coronary artery disease. Please note that although the presence of coronary artery calcium documents the presence of coronary artery disease, the severity of this disease and any potential stenosis cannot be assessed on this non-gated CT examination. Assessment for potential risk factor modification, dietary therapy or  pharmacologic therapy may be warranted, if clinically indicated. 4. Cholelithiasis. Electronically Signed   By: Vinnie Langton M.D.   On: 04/25/2016 18:59   US Paracentesis  Result Date: 04/26/2016 INDICATION: Patient with abdominal pain and large complex solid/ cystic adnexal mass, ascites, omental thickening concerning for ovarian cancer; request received for diagnostic and therapeutic paracentesis. EXAM: ULTRASOUND GUIDED DIAGNOSTIC AND THERAPEUTIC PARACENTESIS MEDICATIONS: None. COMPLICATIONS: None immediate. PROCEDURE: Informed written consent was obtained from the patient after a discussion of the risks, benefits and alternatives to treatment. A timeout was performed prior to the initiation of the procedure. Initial ultrasound scanning demonstrates a moderate amount of ascites within the left mid to lower abdominal quadrant. The left mid to lower abdomen was prepped and draped in the usual sterile fashion. 1% lidocaine was used for local anesthesia. Following this, a Yueh catheter was introduced. An ultrasound image was saved for documentation purposes. The paracentesis was performed. The catheter was removed and a dressing was applied. The patient tolerated the procedure well without immediate post procedural complication. FINDINGS: A total of approximately 2.5 liters of turbid, bloody fluid was removed. Samples were sent to the laboratory as requested by the clinical team. IMPRESSION: Successful ultrasound-guided diagnostic and therapeutic paracentesis yielding 2.5 liters of peritoneal fluid. Read by: Rowe Robert, PA-C Electronically Signed   By: Corrie Mckusick D.O.   On: 04/26/2016 13:58   Dg Chest Port 1 View  Result Date: 04/25/2016 CLINICAL DATA:  Abdominal distension with fever EXAM: PORTABLE CHEST 1 VIEW COMPARISON:  None. FINDINGS: Low lung volumes. Non inclusion of the left CP angle. Borderline to mild cardiomegaly, exaggerated by low lung volume. Mild atherosclerosis. No focal  consolidation. No pneumothorax. IMPRESSION: Low lung volumes, without infiltrate or edema. Borderline to mild cardiomegaly. Electronically Signed   By: Donavan Foil M.D.   On: 04/25/2016 17:54     LOS: 3 days   Oren Binet, MD  [facility] Pager:[phone]  If 7PM-7AM, please contact night-coverage [email or web address] Password [facility] 04/28/2016, 10:27 AM

## 2016-04-28 NOTE — Plan of Care (Signed)
Problem: Fluid Volume: Goal: Hemodynamic stability will improve Outcome: Progressing Patient requesting that foley be removed. Having day shift address with MD. Patient hydration improved. Still receiving NS at 75/hr.  Problem: Activity: Goal: Risk for activity intolerance will decrease Outcome: Progressing Patient up from chair, bed, to bedside commode, and back, several times over night shift. Patient with standby / contact guard assist. Tolerated fairly well. A little short of breath, but pain much decreased from previous shifts.

## 2016-04-29 DIAGNOSIS — A4101 Sepsis due to Methicillin susceptible Staphylococcus aureus: Principal | ICD-10-CM

## 2016-04-29 LAB — CULTURE, BLOOD (ROUTINE X 2): Special Requests: ADEQUATE

## 2016-04-29 LAB — COMPREHENSIVE METABOLIC PANEL
ALBUMIN: 1.8 g/dL — AB (ref 3.5–5.0)
ALK PHOS: 84 U/L (ref 38–126)
ALT: 7 U/L — ABNORMAL LOW (ref 14–54)
AST: 13 U/L — AB (ref 15–41)
Anion gap: 9 (ref 5–15)
BUN: 31 mg/dL — AB (ref 6–20)
CO2: 20 mmol/L — AB (ref 22–32)
Calcium: 7.7 mg/dL — ABNORMAL LOW (ref 8.9–10.3)
Chloride: 101 mmol/L (ref 101–111)
Creatinine, Ser: 0.83 mg/dL (ref 0.44–1.00)
GFR calc Af Amer: >60 mL/min (ref >60)
GFR calc non Af Amer: >60 mL/min (ref >60)
GLUCOSE: 117 mg/dL — AB (ref 65–99)
POTASSIUM: 4.5 mmol/L (ref 3.5–5.1)
SODIUM: 130 mmol/L — AB (ref 135–145)
TOTAL PROTEIN: 5.3 g/dL — AB (ref 6.5–8.1)
Total Bilirubin: 0.6 mg/dL (ref 0.3–1.2)

## 2016-04-29 LAB — CBC
HCT: 27.6 % — ABNORMAL LOW (ref 36.0–46.0)
HEMATOCRIT: 25.4 % — AB (ref 36.0–46.0)
HEMOGLOBIN: 7 g/dL — AB (ref 12.0–15.0)
Hemoglobin: 7.6 g/dL — ABNORMAL LOW (ref 12.0–15.0)
MCH: 18.2 pg — ABNORMAL LOW (ref 26.0–34.0)
MCH: 18.2 pg — ABNORMAL LOW (ref 26.0–34.0)
MCHC: 27.6 g/dL — ABNORMAL LOW (ref 30.0–36.0)
MCHC: 27.5 g/dL — ABNORMAL LOW (ref 30.0–36.0)
MCV: 66.1 fL — ABNORMAL LOW (ref 78.0–100.0)
MCV: 66 fL — ABNORMAL LOW (ref 78.0–100.0)
Platelets: 363 10*3/uL (ref 150–400)
Platelets: 396 10*3/uL (ref 150–400)
RBC: 3.84 MIL/uL — ABNORMAL LOW (ref 3.87–5.11)
RBC: 4.18 MIL/uL (ref 3.87–5.11)
RDW: 20.9 % — AB (ref 11.5–15.5)
RDW: 21.1 % — ABNORMAL HIGH (ref 11.5–15.5)
WBC: 23.5 10*3/uL — AB (ref 4.0–10.5)
WBC: 26.1 10*3/uL — ABNORMAL HIGH (ref 4.0–10.5)

## 2016-04-29 LAB — PREPARE RBC (CROSSMATCH)

## 2016-04-29 LAB — GRAM STAIN

## 2016-04-29 LAB — GLUCOSE, CAPILLARY: Glucose-Capillary: 111 mg/dL — ABNORMAL HIGH (ref 65–99)

## 2016-04-29 MED ORDER — FUROSEMIDE 10 MG/ML IJ SOLN
40.0000 mg | Freq: Once | INTRAMUSCULAR | Status: AC
  Administered 2016-04-29: 40 mg via INTRAVENOUS
  Filled 2016-04-29: qty 4

## 2016-04-29 MED ORDER — HYDROMORPHONE HCL 1 MG/ML IJ SOLN: 0.5000 mg | INTRAMUSCULAR | Status: AC | PRN

## 2016-04-29 MED ORDER — SODIUM CHLORIDE 0.9 % IV SOLN
Freq: Once | INTRAVENOUS | Status: AC
  Administered 2016-04-29: 1 mL via INTRAVENOUS

## 2016-04-29 MED ORDER — ACETAMINOPHEN 325 MG PO TABS
650.0000 mg | ORAL_TABLET | Freq: Once | ORAL | Status: AC
  Administered 2016-04-29: 650 mg via ORAL
  Filled 2016-04-29: qty 2

## 2016-04-29 MED ORDER — OXYCODONE HCL 5 MG PO TABS
5.0000 mg | ORAL_TABLET | ORAL | Status: DC | PRN
  Administered 2016-04-29 – 2016-05-03 (×4): 10 mg via ORAL
  Administered 2016-05-03: 5 mg via ORAL
  Administered 2016-05-04: 10 mg via ORAL
  Filled 2016-04-29: qty 2
  Filled 2016-04-29: qty 1
  Filled 2016-04-29 (×4): qty 2

## 2016-04-29 MED ORDER — METOPROLOL TARTRATE 50 MG PO TABS
50.0000 mg | ORAL_TABLET | Freq: Two times a day (BID) | ORAL | Status: DC
  Administered 2016-04-29 – 2016-05-03 (×9): 50 mg via ORAL
  Filled 2016-04-29 (×9): qty 1

## 2016-04-29 MED ORDER — DIPHENHYDRAMINE HCL 50 MG/ML IJ SOLN
25.0000 mg | Freq: Once | INTRAMUSCULAR | Status: AC
  Administered 2016-04-29: 25 mg via INTRAVENOUS
  Filled 2016-04-29: qty 1

## 2016-04-29 NOTE — Progress Notes (Signed)
INFECTIOUS DISEASE PROGRESS NOTE  ID: Allison Haynes is a 61 y.o. female with  Principal Problem:   Sepsis ([place]) Active Problems:   Ovarian cancer ([place])   Malignant ascites   Microcytic anemia   Thrombocytosis ([facility])   Hyponatremia   Severe malnutrition ([facility])   Malnutrition of moderate degree  Subjective: Without complaints. More comfortable post rx this AM  Abtx:  Anti-infectives    Start     Dose/Rate Route Frequency Ordered Stop   04/28/16 0500  vancomycin (VANCOCIN) 1,500 mg in sodium chloride 0.9 % 500 mL IVPB  Status:  Discontinued     1,500 mg 250 mL/hr over 120 Minutes Intravenous Every 24 hours 04/27/16 0908 04/27/16 1544   04/27/16 1700  ceFAZolin (ANCEF) IVPB 1 g/50 mL premix  Status:  Discontinued     1 g 100 mL/hr over 30 Minutes Intravenous Every 8 hours 04/27/16 1544 04/27/16 1548   04/27/16 1700  ceFAZolin (ANCEF) IVPB 2g/100 mL premix     2 g 200 mL/hr over 30 Minutes Intravenous Every 8 hours 04/27/16 1548     04/26/16 1700  vancomycin (VANCOCIN) 1,250 mg in sodium chloride 0.9 % 250 mL IVPB  Status:  Discontinued     1,250 mg 166.7 mL/hr over 90 Minutes Intravenous Every 12 hours 04/26/16 0908 04/27/16 0908   04/26/16 0200  piperacillin-tazobactam (ZOSYN) IVPB 3.375 g  Status:  Discontinued     3.375 g 12.5 mL/hr over 240 Minutes Intravenous Every 8 hours 04/25/16 1827 04/27/16 1544   04/25/16 2300  vancomycin (VANCOCIN) IVPB 1000 mg/200 mL premix  Status:  Discontinued     1,000 mg 200 mL/hr over 60 Minutes Intravenous Every 8 hours 04/25/16 2247 04/26/16 0908   04/25/16 1745  piperacillin-tazobactam (ZOSYN) IVPB 3.375 g     3.375 g 100 mL/hr over 30 Minutes Intravenous  Once 04/25/16 1738 04/25/16 1854      Medications:  Scheduled: . acetaminophen  650 mg Rectal Once  . cyanocobalamin  1,000 mcg Subcutaneous Daily  . enoxaparin (LOVENOX) injection  40 mg Subcutaneous Q24H  . ferrous sulfate  325 mg Oral BID WC  . furosemide  40 mg Oral Daily  .  metoprolol tartrate  50 mg Oral BID  . multivitamin with minerals  1 tablet Oral Daily  . polyethylene glycol  17 g Oral Daily  . potassium chloride  20 mEq Oral Daily  . protein supplement shake  11 oz Oral TID BM  . sodium chloride flush  3 mL Intravenous Q12H    Objective: Vital signs in last 24 hours: Temp:  [97.6 F (36.4 C)-98 F (36.7 C)] 98 F (36.7 C) (04/25 0808) Pulse Rate:  [71-83] 71 (04/25 0647) Resp:  [16-25] 16 (04/25 0647) BP: (112-135)/(59-81) 127/76 (04/25 0512) SpO2:  [93 %-100 %] 99 % (04/25 0647) Weight:  [104.4 kg (230 lb 2.6 oz)] 104.4 kg (230 lb 2.6 oz) (04/25 0500)   General appearance: alert, cooperative and no distress Resp: clear to auscultation bilaterally Cardio: regular rate and rhythm GI: abnormal findings:  distended, hypoactive bowel sounds and tender  Lab Results  Recent Labs  04/28/16 0414 04/29/16 0219  WBC 27.1* 23.5*  HGB 7.1* 7.0*  HCT 24.8* 25.4*  NA 133* 130*  K 4.4 4.5  CL 101 101  CO2 22 20*  BUN 30* 31*  CREATININE 0.96 0.83   Liver Panel  Recent Labs  04/28/16 0414 04/29/16 0219  PROT 5.2* 5.3*  ALBUMIN 1.9* 1.8*  AST 14*  13*  ALT 10* 7*  ALKPHOS 78 84  BILITOT 1.0 0.6   Sedimentation Rate No results for input(s): ESRSEDRATE in the last 72 hours. C-Reactive Protein No results for input(s): CRP in the last 72 hours.  Microbiology: Recent Results (from the past 240 hour(s))  Blood Culture (routine x 2)     Status: None (Preliminary result)   Collection Time: 04/25/16  5:54 PM  Result Value Ref Range Status   Specimen Description BLOOD RIGHT WRIST  Final   Special Requests IN PEDIATRIC BOTTLE Blood Culture adequate volume  Final   Culture NO GROWTH 3 DAYS  Final   Report Status PENDING  Incomplete  Blood Culture (routine x 2)     Status: Abnormal   Collection Time: 04/25/16  5:58 PM  Result Value Ref Range Status   Specimen Description BLOOD RIGHT ANTECUBITAL  Final   Special Requests   Final     BOTTLES DRAWN AEROBIC AND ANAEROBIC Blood Culture adequate volume   Culture  Setup Time   Final    GRAM POSITIVE COCCI IN CLUSTERS AEROBIC BOTTLE ONLY Organism ID to follow CRITICAL RESULT CALLED TO, READ BACK BY AND VERIFIED WITH: AElta [place].D. 12:00 04/27/16 ([email or web address])    Culture STAPHYLOCOCCUS AUREUS (A)  Final   Report Status 04/29/2016 FINAL  Final   Organism ID, Bacteria STAPHYLOCOCCUS AUREUS  Final      Susceptibility   Staphylococcus aureus - MIC*    CIPROFLOXACIN <=0.5 SENSITIVE Sensitive     ERYTHROMYCIN <=0.25 SENSITIVE Sensitive     GENTAMICIN <=0.5 SENSITIVE Sensitive     OXACILLIN <=0.25 SENSITIVE Sensitive     TETRACYCLINE <=1 SENSITIVE Sensitive     VANCOMYCIN 1 SENSITIVE Sensitive     TRIMETH/SULFA <=10 SENSITIVE Sensitive     CLINDAMYCIN <=0.25 SENSITIVE Sensitive     RIFAMPIN <=0.5 SENSITIVE Sensitive     Inducible Clindamycin NEGATIVE Sensitive     * STAPHYLOCOCCUS AUREUS  Blood Culture ID Panel (Reflexed)     Status: Abnormal   Collection Time: 04/25/16  5:58 PM  Result Value Ref Range Status   Enterococcus species NOT DETECTED NOT DETECTED Final   Listeria monocytogenes NOT DETECTED NOT DETECTED Final   Staphylococcus species DETECTED (A) NOT DETECTED Final    Comment: CRITICAL RESULT CALLED TO, READ BACK BY AND VERIFIED WITH: AElta [place].D. 12:00 04/27/16 ([email or web address])    Staphylococcus aureus DETECTED (A) NOT DETECTED Final    Comment: Methicillin (oxacillin) susceptible Staphylococcus aureus (MSSA). Preferred therapy is anti staphylococcal beta lactam antibiotic (Cefazolin or Nafcillin), unless clinically contraindicated. CRITICAL RESULT CALLED TO, READ BACK BY AND VERIFIED WITH: AElta [place].D. 12:00 04/27/16 ([email or web address])    Methicillin resistance NOT DETECTED NOT DETECTED Final   Streptococcus species NOT DETECTED NOT DETECTED Final   Streptococcus agalactiae NOT DETECTED NOT DETECTED Final   Streptococcus pneumoniae NOT DETECTED NOT DETECTED  Final   Streptococcus pyogenes NOT DETECTED NOT DETECTED Final   Acinetobacter baumannii NOT DETECTED NOT DETECTED Final   Enterobacteriaceae species NOT DETECTED NOT DETECTED Final   Enterobacter cloacae complex NOT DETECTED NOT DETECTED Final   Escherichia coli NOT DETECTED NOT DETECTED Final   Klebsiella oxytoca NOT DETECTED NOT DETECTED Final   Klebsiella pneumoniae NOT DETECTED NOT DETECTED Final   Proteus species NOT DETECTED NOT DETECTED Final   Serratia marcescens NOT DETECTED NOT DETECTED Final   Haemophilus influenzae NOT DETECTED NOT DETECTED Final   Neisseria meningitidis NOT DETECTED NOT DETECTED Final   Pseudomonas aeruginosa NOT DETECTED  NOT DETECTED Final   Candida albicans NOT DETECTED NOT DETECTED Final   Candida glabrata NOT DETECTED NOT DETECTED Final   Candida krusei NOT DETECTED NOT DETECTED Final   Candida parapsilosis NOT DETECTED NOT DETECTED Final   Candida tropicalis NOT DETECTED NOT DETECTED Final  MRSA PCR Screening     Status: None   Collection Time: 04/25/16 10:25 PM  Result Value Ref Range Status   MRSA by PCR NEGATIVE NEGATIVE Final    Comment:        The GeneXpert MRSA Assay (FDA approved for NASAL specimens only), is one component of a comprehensive MRSA colonization surveillance program. It is not intended to diagnose MRSA infection nor to guide or monitor treatment for MRSA infections.   Urine culture     Status: None   Collection Time: 04/26/16  5:30 AM  Result Value Ref Range Status   Specimen Description URINE, RANDOM  Final   Special Requests NONE  Final   Culture NO GROWTH  Final   Report Status 04/27/2016 FINAL  Final  Culture, body fluid-bottle     Status: None (Preliminary result)   Collection Time: 04/26/16  1:36 PM  Result Value Ref Range Status   Specimen Description FLUID  Final   Special Requests PERITONEAL  Final   Culture NO GROWTH 2 DAYS  Final   Report Status PENDING  Incomplete  Gram stain     Status: None    Collection Time: 04/26/16  1:36 PM  Result Value Ref Range Status   Specimen Description FLUID  Final   Special Requests PERITONEAL  Final   Gram Stain   Final    WBC PRESENT, PREDOMINANTLY PMN GRAM POSITIVE COCCI IN CLUSTERS IN PAIRS    Report Status 04/26/2016 FINAL  Final  Gram stain     Status: None (Preliminary result)   Collection Time: 04/28/16 11:40 AM  Result Value Ref Range Status   Specimen Description FLUID PERITONEAL  Final   Special Requests NONE  Final   Gram Stain   Final    ABUNDANT WBC PRESENT,BOTH PMN AND MONONUCLEAR NO ORGANISMS SEEN    Report Status PENDING  Incomplete    Studies/Results: Ir Paracentesis  Result Date: 04/28/2016 INDICATION: Patient with ascites. Request is made for diagnostic and therapeutic paracentesis. EXAM: ULTRASOUND GUIDED DIAGNOSTIC AND THERAPEUTIC PARACENTESIS MEDICATIONS: 10 mL 1% lidocaine COMPLICATIONS: None immediate. PROCEDURE: Informed written consent was obtained from the patient after a discussion of the risks, benefits and alternatives to treatment. A timeout was performed prior to the initiation of the procedure. Initial ultrasound scanning demonstrates a large amount of ascites within the right lateral abdomen. The right lateral abdomen was prepped and draped in the usual sterile fashion. 1% lidocaine was used for local anesthesia. Following this, a 19 gauge, 7-cm, Yueh catheter was introduced. An ultrasound image was saved for documentation purposes. The paracentesis was performed. The catheter was removed and a dressing was applied. The patient tolerated the procedure well without immediate post procedural complication. FINDINGS: A total of approximately 530 mL of thick, amber fluid was removed. Samples were sent to the laboratory as requested by the clinical team. IMPRESSION: Successful ultrasound-guided diagnostic and therapeutic paracentesis yielding 530 liters of peritoneal fluid. Read by:  Brynda Greathouse PA-C Electronically  Signed   By: Corrie Mckusick D.O.   On: 04/28/2016 13:59     Assessment/Plan: MSSA bacteremia Repeat BCx sent this AM, so far ngtd TTE ED 60-65%  Pelvic Mass Cytology- no malignant cells Await  repeat cytology from 4-24 Tumor markers - CA 125- 227, CEA nl, CA-19-1 nl abd very distended and tender, would query if she needs debulking.  GYN f/u consider palliative eval?  Protein Calorie Malnutrition  Anemia HCT down, Hgb stable.   I explained her tests to her (negative cytology, positive tumor marker) and she said "you worry about that".   Total days of antibiotics: 5 ancef         Bobby Rumpf Infectious Diseases (pager) [phone] [email or web address] 04/29/2016, 9:24 AM  LOS: 4 days

## 2016-04-29 NOTE — Plan of Care (Signed)
Problem: Education: Goal: Knowledge of [facility] Education information/materials will improve Outcome: Progressing Plan of care reviewed with patient and family earlier in shift, no questions from any parties at this time.

## 2016-04-29 NOTE — Progress Notes (Signed)
Administered 1 PRBC; at completion, IV in left upper arm infiltrated. Patient had bruising on arm as a result.  Provided supportive care to patient: warm compresses and elevation of left arm.  Dr. Sloan Leiter is aware; unaware if blood administration was effected; CBC has not resulted.

## 2016-04-29 NOTE — Progress Notes (Signed)
Physical Therapy Treatment Patient Details Name: Allison Haynes MRN: [id number] DOB: Oct 25, 1955 Today's Date: 04/29/2016    History of Present Illness Allison Haynes is a 61 y.o. Female with no known medical problems prior to this hospital admission who presented to [facility] with severe abdominal pain and distention on 04/25/16.  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    PT Comments    Patient continues to demo decreased activity tolerance. SpO2 88-92% on RA with mobility. Pt would have limited caregiver assistance if d/c home as mother is unable to provide any physical assistance. Continue to progress as tolerated with anticipated d/c to SNF for further skilled PT services.     Follow Up Recommendations  SNF     Equipment Recommendations  Rolling walker with 5" wheels    Recommendations for Other Services       Precautions / Restrictions Precautions Precautions: Fall    Mobility  Bed Mobility                  Transfers Overall transfer level: Needs assistance Equipment used: Rolling walker (2 wheeled) Transfers: Sit to/from Stand Sit to Stand: Min assist;Min guard         General transfer comment: cues for hand placement/safety from recliner and BSC  Ambulation/Gait Ambulation/Gait assistance: Min assist Ambulation Distance (Feet): 70 Feet Assistive device: Rolling walker (2 wheeled) Gait Pattern/deviations: Step-through pattern;Decreased stride length;Wide base of support;Trunk flexed     General Gait Details: cues for safe use of AD; assist with directional changes/turns; slow pace   Stairs            Wheelchair Mobility    Modified Rankin (Stroke Patients Only)       Balance     Sitting balance-Leahy Scale: Good       Standing  balance-Leahy Scale: Poor                              Cognition Arousal/Alertness: Awake/alert Behavior During Therapy: WFL for tasks assessed/performed Overall Cognitive Status: Within Functional Limits for tasks assessed                                        Exercises      General Comments        Pertinent Vitals/Pain Pain Assessment: Faces Faces Pain Scale: Hurts even more Pain Location: lower back Pain Descriptors / Indicators: Aching;Grimacing;Guarding (pt kept closing eyes while ambulating due to pain) Pain Intervention(s): Limited activity within patient's tolerance;Monitored during session;Premedicated before session;Repositioned    Home Living                      Prior Function            PT Goals (current goals can now be found in the care plan section) Progress towards PT goals: Progressing toward goals    Frequency    Min 3X/week      PT Plan Current plan remains appropriate    Co-evaluation             End of Session Equipment Utilized During Treatment: Gait belt Activity Tolerance: Patient tolerated treatment well Patient left: in chair;with call bell/phone within reach Nurse Communication: Mobility  status PT Visit Diagnosis: Difficulty in walking, not elsewhere classified (R26.2);Muscle weakness (generalized) (M62.81)     Time: 7471-8550 PT Time Calculation (min) (ACUTE ONLY): 26 min  Charges:  $Gait Training: 8-22 mins $Therapeutic Activity: 8-22 mins                    G Codes:       Earney Navy, PTA Pager: [phone]     Darliss Cheney 04/29/2016, 5:30 PM

## 2016-04-29 NOTE — Progress Notes (Signed)
Patient reports intolerance of chocolate premier protein shakes due to unpleasant memories of chocolate milk as a child making her sick due to lactose intolerance. Advised patient that this information would be provided to RD in order to find meal supplement more tolerable for her.

## 2016-04-29 NOTE — Progress Notes (Signed)
Patient placed on 1.5L nasal cannula while sleeping due to sats dropping into mid 80s. Sats now 99% and patient resting comfortably.

## 2016-04-29 NOTE — Plan of Care (Signed)
Problem: Fluid Volume: Goal: Hemodynamic stability will improve Outcome: Progressing Patient BP has remained stable without the need for fluid bolus since 4/23

## 2016-04-29 NOTE — Progress Notes (Signed)
Notified by telemonitoring tech pt had 2 runs of short SVT with max HR 157 for a few seconds. Strips saved, NP on call notified. Patient is asymptomatic. Will continue to monitor closely.

## 2016-04-29 NOTE — Progress Notes (Signed)
PROGRESS NOTE  Allison Haynes YPP:[id number] DOB: 1955-05-05 DOA: 04/25/2016 PCP: No primary care provider on file.   LOS: 4 days   Brief Narrative: Patient is a 60 y.o. female with no significant medical history-but has not seen a physician for the past number of years -presented to the ED on 4/21 with worsening abdominal pain and distention. 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 Patient was started on broad spectrum antibiotics and admitted for further eval and treatment. She underwent paracentesis on 4/22-which showed significant leukocytosis (WBC 12,625) and gram  + cocci in clusters in pairs. Blood cultures subsequently positive for MSSA. Hospital course has been complicated by transient Afib RVR. See below for further details.  Subjective: Pt was seen and examined at bedside. Pt appears in no acute distress, although ill-appearing. She denies CP, palpitations, SOB, or dyspnea, however continues to experience abdominal discomfort that is mildly TTP. Abdomen is still significantly distended, although shows improvement.  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 Blood cultures on 4/23 came back positive for MSSA, gram stain on the ascites fluid was positive for gram-positive cocci, cultures were negative. Repeat blood and ascitic fluid cultures on 4/24 are pending. Followed by ID, continue Ancef and follow leukocytosis trend- trending down. As noted on my note on 4/23, no evidence of bowel perforation on CT chest. Continue stepdown monitoring.  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  Unfortunately with patient's deconditioning, malnutrition and now with sepsis-probably will not be able to initiate chemotherapy right away. Suspect she will need to be discharged to SNF and will need outpatient follow-up with oncology. Cytology from ascites fluid was negative for malignancy on 4/23, repeat cytology ordered 4/24 and pending. Her belly is still distended, has had 2 paracenteses so far. Continue low-dose Lasix and follow.  Paroxysmal Atrial Fibrillation: RVR Occurred on 4/23- continue on low-dose beta blocker- maintaining sinus rhythm. Echo on 4/23 showed EF around 60-65% with grade 2 diastolic dysfunction and moderate pulmonary hypertension. Since Chads2vasc score of 1 pt will not need to be anticoagulate, especially given her severe anemia. Troponin level was elevated on 4/23, likely due to decreased cardiac perfusion, pt was asymptomatic, will continue to monitor.  AKI: Resolved. Likely secondary to hemodynamic mediated injury due to sepsis.  Anemia of chronic disease: Supported by anemia panel- most likely worsened by acute illness/sepsis. She was transfused 1 unit PRBC on 4/23 without any significant increase in hemoglobin, Hgb remains at 7.0 today-will transfuse 1 unit. Monitor for now. Continue vitamin B-12 supplements for low levels.   Thrombocytosis: Likely reactive, due to possible underlying malignancy.  Moderate protein calorie malnutrition: Seen by nutrition, continue supplements.  DVT prophylaxis: Lovenox Code Status: FULL Family Communication: None at bedside Disposition Plan: Remain inpatient, not medically stable for dispo yet  Consultants:   GYN Oncology  Infectious Diseases  Procedures:   Echo 4/23: Relatively high velocities are seen across all cardiac valves, consistent with increased cardiac output (consider anemia, sepsis, thyrotoxicosis, etc.).  Antimicrobials:  Ancef 4/23--  Discontinued: Vancomycin, Zosyn   Objective: Vitals:    04/29/16 0500 04/29/16 0512 04/29/16 0647 04/29/16 0808  BP:  127/76    Pulse:  79 71   Resp:  Marland Kitchen)  25 16   Temp:    98 F (36.7 C)  TempSrc:    Oral  SpO2:  96% 99%   Weight: 104.4 kg (230 lb 2.6 oz)     Height:        Intake/Output Summary (Last 24 hours) at 04/29/16 1011 Last data filed at 04/29/16 0903  Gross per 24 hour  Intake          2189.24 ml  Output             1380 ml  Net           809.24 ml   Filed Weights   04/27/16 0500 04/28/16 0500 04/29/16 0500  Weight: 88.4 kg (194 lb 14.2 oz) 91.6 kg (201 lb 15.1 oz) 104.4 kg (230 lb 2.6 oz)    Examination: Constitutional: NAD Vitals:   04/29/16 0500 04/29/16 0512 04/29/16 0647 04/29/16 0808  BP:  127/76    Pulse:  79 71   Resp:  (!) 25 16   Temp:    98 F (36.7 C)  TempSrc:    Oral  SpO2:  96% 99%   Weight: 104.4 kg (230 lb 2.6 oz)     Height:        HEENT: Atraumatic and normocephalic Respiratory: Clear to auscultation bilaterally Cardiovascular: S1 and S2 heard, no murmurs. No LE edema. Abdomen: Bowel sounds positive. Diffuse tenderness to palpation. No peritoneal signs. Significant distention, although improved since yesterday. Skin: warm and dry Psychiatric: Normal judgment and insight. Alert and oriented x 3. Normal mood.    Data Reviewed: I have personally reviewed following labs and imaging studies  CBC:  Recent Labs Lab 04/25/16 1745 04/25/16 1750 04/26/16 0330 04/27/16 0309 04/28/16 0414 04/29/16 0219  WBC 19.4*  --  25.2* 33.8* 27.1* 23.5*  NEUTROABS 17.6*  --  23.4* 32.1* 26.0*  --   HGB 8.2* 10.2* 7.6* 7.1* 7.1* 7.0*  HCT 30.1* 30.0* 28.6* 26.1* 24.8* 25.4*  MCV 65.7*  --  65.7* 66.4* 66.5* 66.1*  PLT 616*  --  457* 427* 366 841   Basic Metabolic Panel:  Recent Labs Lab 04/25/16 1745 04/25/16 1750 04/26/16 0330 04/27/16 0309 04/28/16 0414 04/29/16 0219  NA 134* 134* 136 135 133* 130*  K 4.2 4.2 4.2 4.9 4.4 4.5  CL 99* 98* 105 105 101 101  CO2 22  --  21* 18* 22 20*    GLUCOSE 130* 133* 140* 156* 127* 117*  BUN 8 9 12  27* 30* 31*  CREATININE 0.71 0.70 0.99 1.22* 0.96 0.83  CALCIUM 7.9*  --  7.3* 7.9* 8.0* 7.7*   GFR: Estimated Creatinine Clearance: 86.5 mL/min (by C-G formula based on SCr of 0.83 mg/dL). Liver Function Tests:  Recent Labs Lab 04/25/16 1745 04/26/16 0841 04/27/16 0309 04/28/16 0414 04/29/16 0219  AST 24 14* 19 14* 13*  ALT 12* 9* 9* 10* 7*  ALKPHOS 91 67 63 78 84  BILITOT 1.1 1.3* 1.3* 1.0 0.6  PROT 5.7* 5.0* 5.1* 5.2* 5.3*  ALBUMIN 2.0* 1.6* 2.1* 1.9* 1.8*   No results for input(s): LIPASE, AMYLASE in the last 168 hours. No results for input(s): AMMONIA in the last 168 hours. Coagulation Profile:  Recent Labs Lab 04/25/16 1920  INR 1.38   Cardiac Enzymes:  Recent Labs Lab 04/27/16 0309  TROPONINI 0.11*   BNP (last 3 results) No results for input(s): PROBNP in the last 8760 hours. HbA1C: No results for input(s): HGBA1C in the last 72 hours. CBG:  Recent Labs Lab 04/26/16 0804 04/27/16 0745 04/28/16 0844 04/29/16 0805  GLUCAP 141* 135* 118* 111*   Lipid Profile: No results for input(s): CHOL, HDL, LDLCALC, TRIG, CHOLHDL, LDLDIRECT in the last 72 hours. Thyroid Function Tests:  Recent Labs  04/27/16 0309  TSH 1.816   Anemia Panel: No results for input(s): VITAMINB12, FOLATE, FERRITIN, TIBC, IRON, RETICCTPCT in the last 72 hours. Urine analysis:    Component Value Date/Time   COLORURINE AMBER (A) 04/26/2016 0530   APPEARANCEUR CLEAR 04/26/2016 0530   LABSPEC >1.046 (H) 04/26/2016 0530   PHURINE 5.0 04/26/2016 0530   GLUCOSEU NEGATIVE 04/26/2016 0530   HGBUR NEGATIVE 04/26/2016 0530   BILIRUBINUR NEGATIVE 04/26/2016 0530   KETONESUR NEGATIVE 04/26/2016 0530   PROTEINUR NEGATIVE 04/26/2016 0530   NITRITE NEGATIVE 04/26/2016 0530   LEUKOCYTESUR NEGATIVE 04/26/2016 0530   Sepsis Labs: Invalid input(s): PROCALCITONIN, LACTICIDVEN  Recent Results (from the past 240 hour(s))  Blood Culture  (routine x 2)     Status: None (Preliminary result)   Collection Time: 04/25/16  5:54 PM  Result Value Ref Range Status   Specimen Description BLOOD RIGHT WRIST  Final   Special Requests IN PEDIATRIC BOTTLE Blood Culture adequate volume  Final   Culture NO GROWTH 3 DAYS  Final   Report Status PENDING  Incomplete  Blood Culture (routine x 2)     Status: Abnormal   Collection Time: 04/25/16  5:58 PM  Result Value Ref Range Status   Specimen Description BLOOD RIGHT ANTECUBITAL  Final   Special Requests   Final    BOTTLES DRAWN AEROBIC AND ANAEROBIC Blood Culture adequate volume   Culture  Setup Time   Final    GRAM POSITIVE COCCI IN CLUSTERS AEROBIC BOTTLE ONLY Organism ID to follow CRITICAL RESULT CALLED TO, READ BACK BY AND VERIFIED WITH: AElta Guadeloupe.D. 12:00 04/27/16 ([email or web address])    Culture STAPHYLOCOCCUS AUREUS (A)  Final   Report Status 04/29/2016 FINAL  Final   Organism ID, Bacteria STAPHYLOCOCCUS AUREUS  Final      Susceptibility   Staphylococcus aureus - MIC*    CIPROFLOXACIN <=0.5 SENSITIVE Sensitive     ERYTHROMYCIN <=0.25 SENSITIVE Sensitive     GENTAMICIN <=0.5 SENSITIVE Sensitive     OXACILLIN <=0.25 SENSITIVE Sensitive     TETRACYCLINE <=1 SENSITIVE Sensitive     VANCOMYCIN 1 SENSITIVE Sensitive     TRIMETH/SULFA <=10 SENSITIVE Sensitive     CLINDAMYCIN <=0.25 SENSITIVE Sensitive     RIFAMPIN <=0.5 SENSITIVE Sensitive     Inducible Clindamycin NEGATIVE Sensitive     * STAPHYLOCOCCUS AUREUS  Blood Culture ID Panel (Reflexed)     Status: Abnormal   Collection Time: 04/25/16  5:58 PM  Result Value Ref Range Status   Enterococcus species NOT DETECTED NOT DETECTED Final   Listeria monocytogenes NOT DETECTED NOT DETECTED Final   Staphylococcus species DETECTED (A) NOT DETECTED Final    Comment: CRITICAL RESULT CALLED TO, READ BACK BY AND VERIFIED WITH: AElta [place].D. 12:00 04/27/16 ([email or web address])    Staphylococcus aureus DETECTED (A) NOT DETECTED Final    Comment:  Methicillin (oxacillin) susceptible Staphylococcus aureus (MSSA). Preferred therapy is anti staphylococcal beta lactam antibiotic (Cefazolin or Nafcillin), unless clinically contraindicated. CRITICAL RESULT CALLED TO, READ BACK BY AND VERIFIED WITH: AElta [place].D. 12:00 04/27/16 ([email or web address])    Methicillin resistance NOT DETECTED NOT DETECTED Final   Streptococcus species NOT DETECTED NOT DETECTED Final   Streptococcus agalactiae NOT DETECTED NOT DETECTED Final   Streptococcus pneumoniae  NOT DETECTED NOT DETECTED Final   Streptococcus pyogenes NOT DETECTED NOT DETECTED Final   Acinetobacter baumannii NOT DETECTED NOT DETECTED Final   Enterobacteriaceae species NOT DETECTED NOT DETECTED Final   Enterobacter cloacae complex NOT DETECTED NOT DETECTED Final   Escherichia coli NOT DETECTED NOT DETECTED Final   Klebsiella oxytoca NOT DETECTED NOT DETECTED Final   Klebsiella pneumoniae NOT DETECTED NOT DETECTED Final   Proteus species NOT DETECTED NOT DETECTED Final   Serratia marcescens NOT DETECTED NOT DETECTED Final   Haemophilus influenzae NOT DETECTED NOT DETECTED Final   Neisseria meningitidis NOT DETECTED NOT DETECTED Final   Pseudomonas aeruginosa NOT DETECTED NOT DETECTED Final   Candida albicans NOT DETECTED NOT DETECTED Final   Candida glabrata NOT DETECTED NOT DETECTED Final   Candida krusei NOT DETECTED NOT DETECTED Final   Candida parapsilosis NOT DETECTED NOT DETECTED Final   Candida tropicalis NOT DETECTED NOT DETECTED Final  MRSA PCR Screening     Status: None   Collection Time: 04/25/16 10:25 PM  Result Value Ref Range Status   MRSA by PCR NEGATIVE NEGATIVE Final    Comment:        The GeneXpert MRSA Assay (FDA approved for NASAL specimens only), is one component of a comprehensive MRSA colonization surveillance program. It is not intended to diagnose MRSA infection nor to guide or monitor treatment for MRSA infections.   Urine culture     Status: None    Collection Time: 04/26/16  5:30 AM  Result Value Ref Range Status   Specimen Description URINE, RANDOM  Final   Special Requests NONE  Final   Culture NO GROWTH  Final   Report Status 04/27/2016 FINAL  Final  Culture, body fluid-bottle     Status: None (Preliminary result)   Collection Time: 04/26/16  1:36 PM  Result Value Ref Range Status   Specimen Description FLUID  Final   Special Requests PERITONEAL  Final   Culture NO GROWTH 2 DAYS  Final   Report Status PENDING  Incomplete  Gram stain     Status: None   Collection Time: 04/26/16  1:36 PM  Result Value Ref Range Status   Specimen Description FLUID  Final   Special Requests PERITONEAL  Final   Gram Stain   Final    WBC PRESENT, PREDOMINANTLY PMN GRAM POSITIVE COCCI IN CLUSTERS IN PAIRS    Report Status 04/26/2016 FINAL  Final  Gram stain     Status: None (Preliminary result)   Collection Time: 04/28/16 11:40 AM  Result Value Ref Range Status   Specimen Description FLUID PERITONEAL  Final   Special Requests NONE  Final   Gram Stain   Final    ABUNDANT WBC PRESENT,BOTH PMN AND MONONUCLEAR NO ORGANISMS SEEN    Report Status PENDING  Incomplete      Radiology Studies: Ir Paracentesis  Result Date: 04/28/2016 INDICATION: Patient with ascites. Request is made for diagnostic and therapeutic paracentesis. EXAM: ULTRASOUND GUIDED DIAGNOSTIC AND THERAPEUTIC PARACENTESIS MEDICATIONS: 10 mL 1% lidocaine COMPLICATIONS: None immediate. PROCEDURE: Informed written consent was obtained from the patient after a discussion of the risks, benefits and alternatives to treatment. A timeout was performed prior to the initiation of the procedure. Initial ultrasound scanning demonstrates a large amount of ascites within the right lateral abdomen. The right lateral abdomen was prepped and draped in the usual sterile fashion. 1% lidocaine was used for local anesthesia. Following this, a 19 gauge, 7-cm, Yueh catheter was introduced. An ultrasound  image was saved for documentation purposes. The paracentesis was performed. The catheter was removed and a dressing was applied. The patient tolerated the procedure well without immediate post procedural complication. FINDINGS: A total of approximately 530 mL of thick, amber fluid was removed. Samples were sent to the laboratory as requested by the clinical team. IMPRESSION: Successful ultrasound-guided diagnostic and therapeutic paracentesis yielding 530 liters of peritoneal fluid. Read by:  Brynda Greathouse PA-C Electronically Signed   By: Corrie Mckusick D.O.   On: 04/28/2016 13:59     Scheduled Meds: . acetaminophen  650 mg Rectal Once  . cyanocobalamin  1,000 mcg Subcutaneous Daily  . enoxaparin (LOVENOX) injection  40 mg Subcutaneous Q24H  . ferrous sulfate  325 mg Oral BID WC  . furosemide  40 mg Oral Daily  . metoprolol tartrate  50 mg Oral BID  . multivitamin with minerals  1 tablet Oral Daily  . polyethylene glycol  17 g Oral Daily  . potassium chloride  20 mEq Oral Daily  . protein supplement shake  11 oz Oral TID BM  . sodium chloride flush  3 mL Intravenous Q12H   Continuous Infusions: . sodium chloride 10 mL/hr at 04/28/16 2113  .  ceFAZolin (ANCEF) IV 2 g (04/29/16 0900)   Posey Pronto, PA-S  If 7PM-7AM, please contact night-coverage [email or web address] Password [facility] 04/29/2016, 10:11 AM     Attending MD note Patient was seen, examined,treatment plan was discussed with the PA-S.  I have personally reviewed the clinical findings, lab, imaging studies and management of this patient in detail. I agree with the documentation, as recorded by the PA-S.   Patient Continues to have abdominal distention-has mild abdominal pain.  Telemetry (Personally reviewed): Brief episode of SVT last night, sinus rhythm this morning  On Exam: Vitals:   04/29/16 0512 04/29/16 0647 04/29/16 0808 04/29/16 1128  BP: 127/76   (!) 117/40  Pulse: 79 71  76  Resp: (!) 25 16  19   Temp:   98 F  (36.7 C) 97.5 F (36.4 C)  TempSrc:   Oral Oral  SpO2: 96% 99%  100%  Weight:      Height:       Gen. exam: Awake, alert, not in any distress Chest: Good air entry bilaterally, no rhonchi or rales CVS: S1-S2 regular, no murmurs Abdomen: Soft, Distended-and slightly diffusely tender. No peritoneal signs.  Neurology: Non-focal Skin: No rash or lesions   Lab Data: CBC:  Recent Labs Lab 04/25/16 1745 04/25/16 1750 04/26/16 0330 04/27/16 0309 04/28/16 0414 04/29/16 0219  WBC 19.4*  --  25.2* 33.8* 27.1* 23.5*  NEUTROABS 17.6*  --  23.4* 32.1* 26.0*  --   HGB 8.2* 10.2* 7.6* 7.1* 7.1* 7.0*  HCT 30.1* 30.0* 28.6* 26.1* 24.8* 25.4*  MCV 65.7*  --  65.7* 66.4* 66.5* 66.1*  PLT 616*  --  457* 427* 366 277    Basic Metabolic Panel:  Recent Labs Lab 04/25/16 1745 04/25/16 1750 04/26/16 0330 04/27/16 0309 04/28/16 0414 04/29/16 0219  NA 134* 134* 136 135 133* 130*  K 4.2 4.2 4.2 4.9 4.4 4.5  CL 99* 98* 105 105 101 101  CO2 22  --  21* 18* 22 20*  GLUCOSE 130* 133* 140* 156* 127* 117*  BUN 8 9 12  27* 30* 31*  CREATININE 0.71 0.70 0.99 1.22* 0.96 0.83  CALCIUM 7.9*  --  7.3* 7.9* 8.0* 7.7*    GFR: Estimated Creatinine Clearance: 86.5 mL/min (by C-G formula based on SCr  of 0.83 mg/dL).  Liver Function Tests:  Recent Labs Lab 04/25/16 1745 04/26/16 0841 04/27/16 0309 04/28/16 0414 04/29/16 0219  AST 24 14* 19 14* 13*  ALT 12* 9* 9* 10* 7*  ALKPHOS 91 67 63 78 84  BILITOT 1.1 1.3* 1.3* 1.0 0.6  PROT 5.7* 5.0* 5.1* 5.2* 5.3*  ALBUMIN 2.0* 1.6* 2.1* 1.9* 1.8*   No results for input(s): LIPASE, AMYLASE in the last 168 hours. No results for input(s): AMMONIA in the last 168 hours.  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

## 2016-04-29 NOTE — Plan of Care (Signed)
Problem: Fluid Volume: Goal: Hemodynamic stability will improve Outcome: Progressing IV Fluids now infusing at lower rate per MD order, patient tolerating well.

## 2016-04-30 DIAGNOSIS — R971 Elevated cancer antigen 125 [CA 125]: Secondary | ICD-10-CM

## 2016-04-30 LAB — CBC
HEMATOCRIT: 25.1 % — AB (ref 36.0–46.0)
Hemoglobin: 6.9 g/dL — CL (ref 12.0–15.0)
MCH: 18.2 pg — AB (ref 26.0–34.0)
MCHC: 27.5 g/dL — AB (ref 30.0–36.0)
MCV: 66.1 fL — AB (ref 78.0–100.0)
PLATELETS: 365 10*3/uL (ref 150–400)
RBC: 3.8 MIL/uL — ABNORMAL LOW (ref 3.87–5.11)
RDW: 21.3 % — AB (ref 11.5–15.5)
WBC: 23.4 10*3/uL — ABNORMAL HIGH (ref 4.0–10.5)

## 2016-04-30 LAB — BASIC METABOLIC PANEL
Anion gap: 9 (ref 5–15)
BUN: 29 mg/dL — AB (ref 6–20)
CHLORIDE: 100 mmol/L — AB (ref 101–111)
CO2: 22 mmol/L (ref 22–32)
CREATININE: 0.88 mg/dL (ref 0.44–1.00)
Calcium: 7.8 mg/dL — ABNORMAL LOW (ref 8.9–10.3)
GFR calc Af Amer: >60 mL/min (ref >60)
GFR calc non Af Amer: >60 mL/min (ref >60)
GLUCOSE: 127 mg/dL — AB (ref 65–99)
POTASSIUM: 4.1 mmol/L (ref 3.5–5.1)
Sodium: 131 mmol/L — ABNORMAL LOW (ref 135–145)

## 2016-04-30 LAB — PREPARE RBC (CROSSMATCH)

## 2016-04-30 LAB — CULTURE, BLOOD (ROUTINE X 2)
Culture: NO GROWTH
SPECIAL REQUESTS: ADEQUATE

## 2016-04-30 LAB — GLUCOSE, CAPILLARY: Glucose-Capillary: 114 mg/dL — ABNORMAL HIGH (ref 65–99)

## 2016-04-30 LAB — SOLUBLE TRANSFERRIN RECEPTOR: Transferrin Receptor: 30.7 nmol/L — ABNORMAL HIGH (ref 12.2–27.3)

## 2016-04-30 MED ORDER — FUROSEMIDE 10 MG/ML IJ SOLN
40.0000 mg | Freq: Two times a day (BID) | INTRAMUSCULAR | Status: DC
  Administered 2016-04-30 – 2016-05-04 (×8): 40 mg via INTRAVENOUS
  Filled 2016-04-30 (×9): qty 4

## 2016-04-30 MED ORDER — SODIUM CHLORIDE 0.9 % IV SOLN
Freq: Once | INTRAVENOUS | Status: AC
  Administered 2016-04-30: 06:00:00 via INTRAVENOUS

## 2016-04-30 MED ORDER — ENOXAPARIN SODIUM 40 MG/0.4ML ~~LOC~~ SOLN
40.0000 mg | SUBCUTANEOUS | Status: DC
  Administered 2016-05-02: 40 mg via SUBCUTANEOUS
  Filled 2016-04-30: qty 0.4

## 2016-04-30 NOTE — Progress Notes (Signed)
CRITICAL VALUE ALERT  Critical value received:  Hgb 6.9  Date of notification:  04/30/16   Time of notification:  0422  Critical value read back:Yes.    Nurse who received alert:  Levonne Hubert   MD notified (1st page):  Schorr, NP  Time of first page:  0423  MD notified (2nd page):  Time of second page:  Responding MD:  Hilbert Bible, NP  Time MD responded:  [phone]

## 2016-04-30 NOTE — Progress Notes (Signed)
CSW met with pt dtr to discuss DC planning.  Pt dtr very concerned about patient disposition- states that patient was living in a condemned house in [place], [place] where the patient has been living without electricity or running water for over 2 years and mold covering most of the house.  Patient has family here in [place] but her sister (who has a house here) is already a caregiver for her own husband who has lung cancer and pts elderly mother who is wheelchair bound.  Dtr lives in [place], [place] and would also be unable to take patient home at DC.  Dtr inquired about SNF placement for patient at time of DC since she will have continuing medical needs.  CSW explained that patient lack of insurance could be a barrier to placement- dtr confirms pt does not have the income to pay for SNF privately.  CSW explained LOG placement and that LOG would have to be approved by CSW supervisor- we cannot get an approval until patient is closer to DC and we are aware of DC needs.   Patient dtr has already applied for Medicaid for patient and Disability application will be finished today or tomorrow- reports that financial counseling plans to submit these applications on May 1st.  Patient dtr very involved in patient care and plans to stay in [place] until patient is transferred to next level of care.  CSW spoke with MD concerning plan for pt- oncology scheduled to follow up with patient and have possible biopsy done- after this is complete we will have better idea of patient DC needs- CSW to follow up with MD 4/27 to discuss further  CSW will continue to follow and assist with disposition as needed  Jorge Ny, [place] Worker [phone]
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
## 2016-04-30 NOTE — Progress Notes (Addendum)
INFECTIOUS DISEASE PROGRESS NOTE  ID: Allison Haynes is a 61 y.o. female with  Principal Problem:   Sepsis ([place]) Active Problems:   Ovarian cancer ([place])   Malignant ascites   Microcytic anemia   Thrombocytosis ([facility])   Hyponatremia   Severe malnutrition ([facility])   Malnutrition of moderate degree  Subjective: No complaints,  Couldn't participate in PT  Abtx:  Anti-infectives    Start     Dose/Rate Route Frequency Ordered Stop   04/28/16 0500  vancomycin (VANCOCIN) 1,500 mg in sodium chloride 0.9 % 500 mL IVPB  Status:  Discontinued     1,500 mg 250 mL/hr over 120 Minutes Intravenous Every 24 hours 04/27/16 0908 04/27/16 1544   04/27/16 1700  ceFAZolin (ANCEF) IVPB 1 g/50 mL premix  Status:  Discontinued     1 g 100 mL/hr over 30 Minutes Intravenous Every 8 hours 04/27/16 1544 04/27/16 1548   04/27/16 1700  ceFAZolin (ANCEF) IVPB 2g/100 mL premix     2 g 200 mL/hr over 30 Minutes Intravenous Every 8 hours 04/27/16 1548     04/26/16 1700  vancomycin (VANCOCIN) 1,250 mg in sodium chloride 0.9 % 250 mL IVPB  Status:  Discontinued     1,250 mg 166.7 mL/hr over 90 Minutes Intravenous Every 12 hours 04/26/16 0908 04/27/16 0908   04/26/16 0200  piperacillin-tazobactam (ZOSYN) IVPB 3.375 g  Status:  Discontinued     3.375 g 12.5 mL/hr over 240 Minutes Intravenous Every 8 hours 04/25/16 1827 04/27/16 1544   04/25/16 2300  vancomycin (VANCOCIN) IVPB 1000 mg/200 mL premix  Status:  Discontinued     1,000 mg 200 mL/hr over 60 Minutes Intravenous Every 8 hours 04/25/16 2247 04/26/16 0908   04/25/16 1745  piperacillin-tazobactam (ZOSYN) IVPB 3.375 g     3.375 g 100 mL/hr over 30 Minutes Intravenous  Once 04/25/16 1738 04/25/16 1854      Medications:  Scheduled: . acetaminophen  650 mg Rectal Once  . cyanocobalamin  1,000 mcg Subcutaneous Daily  . enoxaparin (LOVENOX) injection  40 mg Subcutaneous Q24H  . ferrous sulfate  325 mg Oral BID WC  . furosemide  40 mg Intravenous BID  .  metoprolol tartrate  50 mg Oral BID  . multivitamin with minerals  1 tablet Oral Daily  . polyethylene glycol  17 g Oral Daily  . potassium chloride  20 mEq Oral Daily  . protein supplement shake  11 oz Oral TID BM  . sodium chloride flush  3 mL Intravenous Q12H    Objective: Vital signs in last 24 hours: Temp:  [97.4 F (36.3 C)-98.8 F (37.1 C)] 97.4 F (36.3 C) (04/26 0805) Pulse Rate:  [71-93] 80 (04/26 0805) Resp:  [12-25] 25 (04/26 0805) BP: (111-136)/(40-98) 118/98 (04/26 0805) SpO2:  [95 %-100 %] 98 % (04/26 0805)   General appearance: alert, cooperative and no distress Resp: clear to auscultation bilaterally Cardio: regular rate and rhythm GI: normal findings: bowel sounds normal and abnormal findings:  distended  Large echymoses on LUE at prev IV, txf site.   Lab Results  Recent Labs  04/29/16 0219 04/29/16 1837 04/30/16 0247  WBC 23.5* 26.1* 23.4*  HGB 7.0* 7.6* 6.9*  HCT 25.4* 27.6* 25.1*  NA 130*  --  131*  K 4.5  --  4.1  CL 101  --  100*  CO2 20*  --  22  BUN 31*  --  29*  CREATININE 0.83  --  0.88   Liver Panel  Recent Labs  04/28/16 0414 04/29/16 0219  PROT 5.2* 5.3*  ALBUMIN 1.9* 1.8*  AST 14* 13*  ALT 10* 7*  ALKPHOS 78 84  BILITOT 1.0 0.6   Sedimentation Rate No results for input(s): ESRSEDRATE in the last 72 hours. C-Reactive Protein No results for input(s): CRP in the last 72 hours.  Microbiology: Recent Results (from the past 240 hour(s))  Blood Culture (routine x 2)     Status: None (Preliminary result)   Collection Time: 04/25/16  5:54 PM  Result Value Ref Range Status   Specimen Description BLOOD RIGHT WRIST  Final   Special Requests IN PEDIATRIC BOTTLE Blood Culture adequate volume  Final   Culture NO GROWTH 4 DAYS  Final   Report Status PENDING  Incomplete  Blood Culture (routine x 2)     Status: Abnormal   Collection Time: 04/25/16  5:58 PM  Result Value Ref Range Status   Specimen Description BLOOD RIGHT  ANTECUBITAL  Final   Special Requests   Final    BOTTLES DRAWN AEROBIC AND ANAEROBIC Blood Culture adequate volume   Culture  Setup Time   Final    GRAM POSITIVE COCCI IN CLUSTERS AEROBIC BOTTLE ONLY Organism ID to follow CRITICAL RESULT CALLED TO, READ BACK BY AND VERIFIED WITH: AElta [place].D. 12:00 04/27/16 ([email or web address])    Culture STAPHYLOCOCCUS AUREUS (A)  Final   Report Status 04/29/2016 FINAL  Final   Organism ID, Bacteria STAPHYLOCOCCUS AUREUS  Final      Susceptibility   Staphylococcus aureus - MIC*    CIPROFLOXACIN <=0.5 SENSITIVE Sensitive     ERYTHROMYCIN <=0.25 SENSITIVE Sensitive     GENTAMICIN <=0.5 SENSITIVE Sensitive     OXACILLIN <=0.25 SENSITIVE Sensitive     TETRACYCLINE <=1 SENSITIVE Sensitive     VANCOMYCIN 1 SENSITIVE Sensitive     TRIMETH/SULFA <=10 SENSITIVE Sensitive     CLINDAMYCIN <=0.25 SENSITIVE Sensitive     RIFAMPIN <=0.5 SENSITIVE Sensitive     Inducible Clindamycin NEGATIVE Sensitive     * STAPHYLOCOCCUS AUREUS  Blood Culture ID Panel (Reflexed)     Status: Abnormal   Collection Time: 04/25/16  5:58 PM  Result Value Ref Range Status   Enterococcus species NOT DETECTED NOT DETECTED Final   Listeria monocytogenes NOT DETECTED NOT DETECTED Final   Staphylococcus species DETECTED (A) NOT DETECTED Final    Comment: CRITICAL RESULT CALLED TO, READ BACK BY AND VERIFIED WITH: AElta [place].D. 12:00 04/27/16 ([email or web address])    Staphylococcus aureus DETECTED (A) NOT DETECTED Final    Comment: Methicillin (oxacillin) susceptible Staphylococcus aureus (MSSA). Preferred therapy is anti staphylococcal beta lactam antibiotic (Cefazolin or Nafcillin), unless clinically contraindicated. CRITICAL RESULT CALLED TO, READ BACK BY AND VERIFIED WITH: AElta [place].D. 12:00 04/27/16 ([email or web address])    Methicillin resistance NOT DETECTED NOT DETECTED Final   Streptococcus species NOT DETECTED NOT DETECTED Final   Streptococcus agalactiae NOT DETECTED NOT DETECTED Final    Streptococcus pneumoniae NOT DETECTED NOT DETECTED Final   Streptococcus pyogenes NOT DETECTED NOT DETECTED Final   Acinetobacter baumannii NOT DETECTED NOT DETECTED Final   Enterobacteriaceae species NOT DETECTED NOT DETECTED Final   Enterobacter cloacae complex NOT DETECTED NOT DETECTED Final   Escherichia coli NOT DETECTED NOT DETECTED Final   Klebsiella oxytoca NOT DETECTED NOT DETECTED Final   Klebsiella pneumoniae NOT DETECTED NOT DETECTED Final   Proteus species NOT DETECTED NOT DETECTED Final   Serratia marcescens NOT DETECTED NOT DETECTED Final   Haemophilus influenzae NOT DETECTED  NOT DETECTED Final   Neisseria meningitidis NOT DETECTED NOT DETECTED Final   Pseudomonas aeruginosa NOT DETECTED NOT DETECTED Final   Candida albicans NOT DETECTED NOT DETECTED Final   Candida glabrata NOT DETECTED NOT DETECTED Final   Candida krusei NOT DETECTED NOT DETECTED Final   Candida parapsilosis NOT DETECTED NOT DETECTED Final   Candida tropicalis NOT DETECTED NOT DETECTED Final  MRSA PCR Screening     Status: None   Collection Time: 04/25/16 10:25 PM  Result Value Ref Range Status   MRSA by PCR NEGATIVE NEGATIVE Final    Comment:        The GeneXpert MRSA Assay (FDA approved for NASAL specimens only), is one component of a comprehensive MRSA colonization surveillance program. It is not intended to diagnose MRSA infection nor to guide or monitor treatment for MRSA infections.   Urine culture     Status: None   Collection Time: 04/26/16  5:30 AM  Result Value Ref Range Status   Specimen Description URINE, RANDOM  Final   Special Requests NONE  Final   Culture NO GROWTH  Final   Report Status 04/27/2016 FINAL  Final  Culture, body fluid-bottle     Status: None (Preliminary result)   Collection Time: 04/26/16  1:36 PM  Result Value Ref Range Status   Specimen Description FLUID  Final   Special Requests PERITONEAL  Final   Culture NO GROWTH 3 DAYS  Final   Report Status PENDING   Incomplete  Gram stain     Status: None   Collection Time: 04/26/16  1:36 PM  Result Value Ref Range Status   Specimen Description FLUID  Final   Special Requests PERITONEAL  Final   Gram Stain   Final    WBC PRESENT, PREDOMINANTLY PMN GRAM POSITIVE COCCI IN CLUSTERS IN PAIRS    Report Status 04/26/2016 FINAL  Final  Culture, blood (Routine X 2) w Reflex to ID Panel     Status: None (Preliminary result)   Collection Time: 04/28/16  4:14 AM  Result Value Ref Range Status   Specimen Description BLOOD LEFT ARM  Final   Special Requests AEROBIC BOTTLE ONLY Blood Culture adequate volume  Final   Culture NO GROWTH 1 DAY  Final   Report Status PENDING  Incomplete  Culture, body fluid-bottle     Status: None (Preliminary result)   Collection Time: 04/28/16 11:40 AM  Result Value Ref Range Status   Specimen Description FLUID PERITONEAL  Final   Special Requests NONE  Final   Culture NO GROWTH 1 DAY  Final   Report Status PENDING  Incomplete  Gram stain     Status: None   Collection Time: 04/28/16 11:40 AM  Result Value Ref Range Status   Specimen Description FLUID PERITONEAL  Final   Special Requests NONE  Final   Gram Stain   Final    ABUNDANT WBC PRESENT,BOTH PMN AND MONONUCLEAR NO ORGANISMS SEEN    Report Status 04/29/2016 FINAL  Final    Studies/Results: Ir Paracentesis  Result Date: 04/28/2016 INDICATION: Patient with ascites. Request is made for diagnostic and therapeutic paracentesis. EXAM: ULTRASOUND GUIDED DIAGNOSTIC AND THERAPEUTIC PARACENTESIS MEDICATIONS: 10 mL 1% lidocaine COMPLICATIONS: None immediate. PROCEDURE: Informed written consent was obtained from the patient after a discussion of the risks, benefits and alternatives to treatment. A timeout was performed prior to the initiation of the procedure. Initial ultrasound scanning demonstrates a large amount of ascites within the right lateral abdomen. The right lateral  abdomen was prepped and draped in the usual sterile  fashion. 1% lidocaine was used for local anesthesia. Following this, a 19 gauge, 7-cm, Yueh catheter was introduced. An ultrasound image was saved for documentation purposes. The paracentesis was performed. The catheter was removed and a dressing was applied. The patient tolerated the procedure well without immediate post procedural complication. FINDINGS: A total of approximately 530 mL of thick, amber fluid was removed. Samples were sent to the laboratory as requested by the clinical team. IMPRESSION: Successful ultrasound-guided diagnostic and therapeutic paracentesis yielding 530 liters of peritoneal fluid. Read by:  Brynda Greathouse PA-C Electronically Signed   By: Corrie Mckusick D.O.   On: 04/28/2016 13:59     Assessment/Plan: MSSA bacteremia Repeat BCx sent this AM, so far ngtd [facility] ED 60-65%  Pelvic Mass Cytology- no malignant cells (4-21 and 4-24) Tumor markers - CA 125- 227, CEA nl, CA-19-1 nl abd very distended and tender, would query if she needs debulking.  GYN f/u   Protein Calorie Malnutrition  Anemia HCT/Hgb down  I explained her tests and her overall condition to her and her daughter.  From ID perspective she can go to SNF, I am not sure her about her performance status, CTX, surgery, ect.  I am not clear of the extent of her peritonitis, given her persistent WBC, complex abd.  Would plan for 3 weeks of anbx.   I have asked palliative to see the patient, on hold til GYN-Onc f/u today.    I will sign off.   Total days of antibiotics: 6/21ancef         Bobby Rumpf Infectious Diseases (pager) [phone] [email or web address] 04/30/2016, 10:04 AM  LOS: 5 days

## 2016-04-30 NOTE — Progress Notes (Signed)
PROGRESS NOTE  Allison Haynes WER:[id number] DOB: [birth year] DOA: 04/25/2016 PCP: No primary care provider on file.   LOS: 5 days   Brief Narrative: Patient is a 61 y.o.femalewith no significant medical history-but has not seen a physician for the past number of years -presented to the ED on 4/21 with worsening abdominal pain and distention. 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 Patient was started on broad spectrum antibiotics and admitted for further eval and treatment. She underwent paracentesis on 4/22-which showed significant leukocytosis (WBC 12,625) and gram + cocci in clusters in pairs.Blood cultures subsequently positive for MSSA.Hospital course has been complicated by transient Afib RVR. See below for further details.  Subjective: Pt was sitting comfortably in chair, appears to be more alert than yesterday. Abdomen is more distended than yesterday and swelling in both LE have worsened as well. Pt's abdomen remains TTP especially in the epigastric region. One episode of IV infiltration in L upper arm yesterday, moderate bruising on the medial aspect, pt doesn't complain of any pain.  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 Blood cultures on 4/23 came back positive for MSSA, gramstain on the ascites fluid was positive for gram-positive cocci, cultures were negative x2. ID following, plans are to continue with Ancef for a total of 21 days (currently on day 6) . As noted on my note on 4/23, no evidence of bowel perforation on CT chest.   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 Unfortunately with patient's deconditioning,  malnutrition and now with sepsis-probably will not be able to initiate chemotherapy right away. Furthermore, ascites fluid cytology 2 is negative for malignancy, although her CEA 125 levels elevated-they are not as high as expected. Case discussed by Dr Sloan Leiter with Dr. Denman George over the phone yesterday, she plans to reevaluate the patient on 4/26-and see if she could tolerate a omental biopsy. Belly remains distended-does not appear very tense-suspect it could continue to monitor off paracentesis. Continue Lasix-switched to IV. Await further recommendations from GYN oncology.  Paroxysmal Atrial Fibrillation: RVROccurred on 4/23- continue on low-dose beta blocker- maintaining sinus rhythm. Echo on 4/23 showed EF around 60-65% with grade 2 diastolic dysfunction and moderate pulmonary hypertension. SinceChads2vasc score of 1 pt will not need to be anticoagulate, especially given her severe anemia.   Minimally elevatedTroponin level: Likely secondary to demand ischemia-doubt ACS. Given issues with malignancy. severity of anemia-doubt further workup needed at this time. Echo showed preserved EF without any wall motion abnormalities.   PPJ:KDTOIZTI. Likely secondary to hemodynamic mediated injury due to sepsis.  Anemia of chronic disease: Supported by anemia panel- most likely worsened by acute illness/sepsis. Continues to have significant anemia in spite of 2 units transfused since admission, hemoglobin again at 6.9 today-1 unit of PRBC being transfused today. Vitamin B12 level is low-started on supplementation. Continue to follow CBC periodically  Thrombocytosis:Likely reactive, due to possible underlying malignancy. Platelet count now back to normal.  Moderate protein calorie malnutrition: Likely secondary to underlying malignancy-Seen by nutrition, continue supplements.  DVT prophylaxis: Lovenox Code Status: Full Family Communication: None at bedside, daughter will be here later today, will  share plan with her as well Disposition Plan: Remain inpatient, not ready for dispo yet  Consultants:   GYN Oncology  Infectious Diseases  Procedures:   Echo 4/23: Relatively high velocities are seen across all cardiac valves, consistent with increased cardiac output (consider anemia, sepsis, thyrotoxicosis, etc.).  Antimicrobials:  Ancef 4/23---  D/c: Vancomycin, Zosyn  Objective: Vitals:   04/30/16 0532 04/30/16 0548 04/30/16 0752 04/30/16 0805  BP: 128/73 120/75 (!) 118/98 (!) 118/98  Pulse: 73 71  80  Resp: 12 20  (!) 25  Temp:  97.7 F (36.5 C) 97.9 F (36.6 C) 97.4 F (36.3 C)  TempSrc:  Oral Oral Oral  SpO2: 98% 98%  98%  Weight:      Height:        Intake/Output Summary (Last 24 hours) at 04/30/16 0928 Last data filed at 04/30/16 0522  Gross per 24 hour  Intake              350 ml  Output             1650 ml  Net            -1300 ml   Filed Weights   04/27/16 0500 04/28/16 0500 04/29/16 0500  Weight: 88.4 kg (194 lb 14.2 oz) 91.6 kg (201 lb 15.1 oz) 104.4 kg (230 lb 2.6 oz)    Examination: Constitutional: NAD Vitals:   04/30/16 0532 04/30/16 0548 04/30/16 0752 04/30/16 0805  BP: 128/73 120/75 (!) 118/98 (!) 118/98  Pulse: 73 71  80  Resp: 12 20  (!) 25  Temp:  97.7 F (36.5 C) 97.9 F (36.6 C) 97.4 F (36.3 C)  TempSrc:  Oral Oral Oral  SpO2: 98% 98%  98%  Weight:      Height:       HEENT: Atarumatic and normocephalic Respiratory: clear to auscultation bilaterally, no wheezing, no crackles. Normal respiratory effort. No accessory muscle use.  Cardiovascular: S1 and S2 heard, no murmurs. Worsening of LE edema bilaterally. Abdomen: epigastric tenderness with palpation. Distention has worsened since yesterday. Skin: warm and dry Psychiatric:  Alert and oriented x 3. Normal mood.    Data Reviewed: I have personally reviewed following labs and imaging studies  CBC:  Recent Labs Lab 04/25/16 1745  04/26/16 0330 04/27/16 0309  04/28/16 0414 04/29/16 0219 04/29/16 1837 04/30/16 0247  WBC 19.4*  --  25.2* 33.8* 27.1* 23.5* 26.1* 23.4*  NEUTROABS 17.6*  --  23.4* 32.1* 26.0*  --   --   --   HGB 8.2*  < > 7.6* 7.1* 7.1* 7.0* 7.6* 6.9*  HCT 30.1*  < > 28.6* 26.1* 24.8* 25.4* 27.6* 25.1*  MCV 65.7*  --  65.7* 66.4* 66.5* 66.1* 66.0* 66.1*  PLT 616*  --  457* 427* 366 363 396 365  < > = values in this interval not displayed. Basic Metabolic Panel:  Recent Labs Lab 04/26/16 0330 04/27/16 0309 04/28/16 0414 04/29/16 0219 04/30/16 0247  NA 136 135 133* 130* 131*  K 4.2 4.9 4.4 4.5 4.1  CL 105 105 101 101 100*  CO2 21* 18* 22 20* 22  GLUCOSE 140* 156* 127* 117* 127*  BUN 12 27* 30* 31* 29*  CREATININE 0.99 1.22* 0.96 0.83 0.88  CALCIUM 7.3* 7.9* 8.0* 7.7* 7.8*   GFR: Estimated Creatinine Clearance: 81.6 mL/min (by C-G formula based on SCr of 0.88 mg/dL). Liver Function Tests:  Recent Labs Lab 04/25/16 1745 04/26/16 0841 04/27/16 0309 04/28/16 0414 04/29/16 0219  AST 24 14* 19 14* 13*  ALT 12* 9* 9* 10* 7*  ALKPHOS 91 67 63 78 84  BILITOT 1.1 1.3* 1.3*  1.0 0.6  PROT 5.7* 5.0* 5.1* 5.2* 5.3*  ALBUMIN 2.0* 1.6* 2.1* 1.9* 1.8*   No results for input(s): LIPASE, AMYLASE in the last 168 hours. No results for input(s): AMMONIA in the last 168 hours. Coagulation Profile:  Recent Labs Lab 04/25/16 1920  INR 1.38   Cardiac Enzymes:  Recent Labs Lab 04/27/16 0309  TROPONINI 0.11*   BNP (last 3 results) No results for input(s): PROBNP in the last 8760 hours. HbA1C: No results for input(s): HGBA1C in the last 72 hours. CBG:  Recent Labs Lab 04/26/16 0804 04/27/16 0745 04/28/16 0844 04/29/16 0805 04/30/16 0802  GLUCAP 141* 135* 118* 111* 114*   Lipid Profile: No results for input(s): CHOL, HDL, LDLCALC, TRIG, CHOLHDL, LDLDIRECT in the last 72 hours. Thyroid Function Tests: No results for input(s): TSH, T4TOTAL, FREET4, T3FREE, THYROIDAB in the last 72 hours. Anemia Panel: No  results for input(s): VITAMINB12, FOLATE, FERRITIN, TIBC, IRON, RETICCTPCT in the last 72 hours. Urine analysis:    Component Value Date/Time   COLORURINE AMBER (A) 04/26/2016 0530   APPEARANCEUR CLEAR 04/26/2016 0530   LABSPEC >1.046 (H) 04/26/2016 0530   PHURINE 5.0 04/26/2016 0530   GLUCOSEU NEGATIVE 04/26/2016 0530   HGBUR NEGATIVE 04/26/2016 0530   BILIRUBINUR NEGATIVE 04/26/2016 0530   KETONESUR NEGATIVE 04/26/2016 0530   PROTEINUR NEGATIVE 04/26/2016 0530   NITRITE NEGATIVE 04/26/2016 0530   LEUKOCYTESUR NEGATIVE 04/26/2016 0530   Sepsis Labs: Invalid input(s): PROCALCITONIN, LACTICIDVEN  Recent Results (from the past 240 hour(s))  Blood Culture (routine x 2)     Status: None (Preliminary result)   Collection Time: 04/25/16  5:54 PM  Result Value Ref Range Status   Specimen Description BLOOD RIGHT WRIST  Final   Special Requests IN PEDIATRIC BOTTLE Blood Culture adequate volume  Final   Culture NO GROWTH 4 DAYS  Final   Report Status PENDING  Incomplete  Blood Culture (routine x 2)     Status: Abnormal   Collection Time: 04/25/16  5:58 PM  Result Value Ref Range Status   Specimen Description BLOOD RIGHT ANTECUBITAL  Final   Special Requests   Final    BOTTLES DRAWN AEROBIC AND ANAEROBIC Blood Culture adequate volume   Culture  Setup Time   Final    GRAM POSITIVE COCCI IN CLUSTERS AEROBIC BOTTLE ONLY Organism ID to follow CRITICAL RESULT CALLED TO, READ BACK BY AND VERIFIED WITH: AElta Gu[place].D. 12:00 04/27/16 ([email or web address])    Culture STAPHYLOCOCCUS AUREUS (A)  Final   Report Status 04/29/2016 FINAL  Final   Organism ID, Bacteria STAPHYLOCOCCUS AUREUS  Final      Susceptibility   Staphylococcus aureus - MIC*    CIPROFLOXACIN <=0.5 SENSITIVE Sensitive     ERYTHROMYCIN <=0.25 SENSITIVE Sensitive     GENTAMICIN <=0.5 SENSITIVE Sensitive     OXACILLIN <=0.25 SENSITIVE Sensitive     TETRACYCLINE <=1 SENSITIVE Sensitive     VANCOMYCIN 1 SENSITIVE Sensitive      TRIMETH/SULFA <=10 SENSITIVE Sensitive     CLINDAMYCIN <=0.25 SENSITIVE Sensitive     RIFAMPIN <=0.5 SENSITIVE Sensitive     Inducible Clindamycin NEGATIVE Sensitive     * STAPHYLOCOCCUS AUREUS  Blood Culture ID Panel (Reflexed)     Status: Abnormal   Collection Time: 04/25/16  5:58 PM  Result Value Ref Range Status   Enterococcus species NOT DETECTED NOT DETECTED Final   Listeria monocytogenes NOT DETECTED NOT DETECTED Final   Staphylococcus species DETECTED (A) NOT DETECTED Final    Comment:  CRITICAL RESULT CALLED TO, READ BACK BY AND VERIFIED WITH: AElta [place].D. 12:00 04/27/16 ([email or web address])    Staphylococcus aureus DETECTED (A) NOT DETECTED Final    Comment: Methicillin (oxacillin) susceptible Staphylococcus aureus (MSSA). Preferred therapy is anti staphylococcal beta lactam antibiotic (Cefazolin or Nafcillin), unless clinically contraindicated. CRITICAL RESULT CALLED TO, READ BACK BY AND VERIFIED WITH: AElta [place].D. 12:00 04/27/16 ([email or web address])    Methicillin resistance NOT DETECTED NOT DETECTED Final   Streptococcus species NOT DETECTED NOT DETECTED Final   Streptococcus agalactiae NOT DETECTED NOT DETECTED Final   Streptococcus pneumoniae NOT DETECTED NOT DETECTED Final   Streptococcus pyogenes NOT DETECTED NOT DETECTED Final   Acinetobacter baumannii NOT DETECTED NOT DETECTED Final   Enterobacteriaceae species NOT DETECTED NOT DETECTED Final   Enterobacter cloacae complex NOT DETECTED NOT DETECTED Final   Escherichia coli NOT DETECTED NOT DETECTED Final   Klebsiella oxytoca NOT DETECTED NOT DETECTED Final   Klebsiella pneumoniae NOT DETECTED NOT DETECTED Final   Proteus species NOT DETECTED NOT DETECTED Final   Serratia marcescens NOT DETECTED NOT DETECTED Final   Haemophilus influenzae NOT DETECTED NOT DETECTED Final   Neisseria meningitidis NOT DETECTED NOT DETECTED Final   Pseudomonas aeruginosa NOT DETECTED NOT DETECTED Final   Candida albicans NOT DETECTED NOT  DETECTED Final   Candida glabrata NOT DETECTED NOT DETECTED Final   Candida krusei NOT DETECTED NOT DETECTED Final   Candida parapsilosis NOT DETECTED NOT DETECTED Final   Candida tropicalis NOT DETECTED NOT DETECTED Final  MRSA PCR Screening     Status: None   Collection Time: 04/25/16 10:25 PM  Result Value Ref Range Status   MRSA by PCR NEGATIVE NEGATIVE Final    Comment:        The GeneXpert MRSA Assay (FDA approved for NASAL specimens only), is one component of a comprehensive MRSA colonization surveillance program. It is not intended to diagnose MRSA infection nor to guide or monitor treatment for MRSA infections.   Urine culture     Status: None   Collection Time: 04/26/16  5:30 AM  Result Value Ref Range Status   Specimen Description URINE, RANDOM  Final   Special Requests NONE  Final   Culture NO GROWTH  Final   Report Status 04/27/2016 FINAL  Final  Culture, body fluid-bottle     Status: None (Preliminary result)   Collection Time: 04/26/16  1:36 PM  Result Value Ref Range Status   Specimen Description FLUID  Final   Special Requests PERITONEAL  Final   Culture NO GROWTH 3 DAYS  Final   Report Status PENDING  Incomplete  Gram stain     Status: None   Collection Time: 04/26/16  1:36 PM  Result Value Ref Range Status   Specimen Description FLUID  Final   Special Requests PERITONEAL  Final   Gram Stain   Final    WBC PRESENT, PREDOMINANTLY PMN GRAM POSITIVE COCCI IN CLUSTERS IN PAIRS    Report Status 04/26/2016 FINAL  Final  Culture, blood (Routine X 2) w Reflex to ID Panel     Status: None (Preliminary result)   Collection Time: 04/28/16  4:14 AM  Result Value Ref Range Status   Specimen Description BLOOD LEFT ARM  Final   Special Requests AEROBIC BOTTLE ONLY Blood Culture adequate volume  Final   Culture NO GROWTH 1 DAY  Final   Report Status PENDING  Incomplete  Culture, body fluid-bottle     Status: None (Preliminary result)   Collection  Time: 04/28/16  11:40 AM  Result Value Ref Range Status   Specimen Description FLUID PERITONEAL  Final   Special Requests NONE  Final   Culture NO GROWTH 1 DAY  Final   Report Status PENDING  Incomplete  Gram stain     Status: None   Collection Time: 04/28/16 11:40 AM  Result Value Ref Range Status   Specimen Description FLUID PERITONEAL  Final   Special Requests NONE  Final   Gram Stain   Final    ABUNDANT WBC PRESENT,BOTH PMN AND MONONUCLEAR NO ORGANISMS SEEN    Report Status 04/29/2016 FINAL  Final      Radiology Studies: Ir Paracentesis  Result Date: 04/28/2016 INDICATION: Patient with ascites. Request is made for diagnostic and therapeutic paracentesis. EXAM: ULTRASOUND GUIDED DIAGNOSTIC AND THERAPEUTIC PARACENTESIS MEDICATIONS: 10 mL 1% lidocaine COMPLICATIONS: None immediate. PROCEDURE: Informed written consent was obtained from the patient after a discussion of the risks, benefits and alternatives to treatment. A timeout was performed prior to the initiation of the procedure. Initial ultrasound scanning demonstrates a large amount of ascites within the right lateral abdomen. The right lateral abdomen was prepped and draped in the usual sterile fashion. 1% lidocaine was used for local anesthesia. Following this, a 19 gauge, 7-cm, Yueh catheter was introduced. An ultrasound image was saved for documentation purposes. The paracentesis was performed. The catheter was removed and a dressing was applied. The patient tolerated the procedure well without immediate post procedural complication. FINDINGS: A total of approximately 530 mL of thick, amber fluid was removed. Samples were sent to the laboratory as requested by the clinical team. IMPRESSION: Successful ultrasound-guided diagnostic and therapeutic paracentesis yielding 530 liters of peritoneal fluid. Read by:  Brynda Greathouse PA-C Electronically Signed   By: Corrie Mckusick D.O.   On: 04/28/2016 13:59     Scheduled Meds: . acetaminophen  650 mg  Rectal Once  . cyanocobalamin  1,000 mcg Subcutaneous Daily  . enoxaparin (LOVENOX) injection  40 mg Subcutaneous Q24H  . ferrous sulfate  325 mg Oral BID WC  . furosemide  40 mg Intravenous BID  . metoprolol tartrate  50 mg Oral BID  . multivitamin with minerals  1 tablet Oral Daily  . polyethylene glycol  17 g Oral Daily  . potassium chloride  20 mEq Oral Daily  . protein supplement shake  11 oz Oral TID BM  . sodium chloride flush  3 mL Intravenous Q12H   Continuous Infusions: . sodium chloride 10 mL/hr at 04/28/16 2113  .  ceFAZolin (ANCEF) IV 2 g (04/30/16 0855)    Posey Pronto, PA-S  If 7PM-7AM, please contact night-coverage [email or web address] Password [facility] 04/30/2016, 9:28 AM   Attending MD note Patient was seen, examined,treatment plan was discussed with the PA-S.  I have personally reviewed the clinical findings, lab, imaging studies and management of this patient in detail. I agree with the documentation, as recorded by the PA-S.   Patient continues to have abdominal distention, has mild abdominal pain diffusely.  Has more significant lower extremity edema bilaterally today.  Telemetry (Personally reviewed): Sinus rhythm  On Exam: Vitals:   04/30/16 0548 04/30/16 0752 04/30/16 0805 04/30/16 1210  BP: 120/75 (!) 118/98 (!) 118/98 131/77  Pulse: 71  80 85  Resp: 20  (!) 25 17  Temp: 97.7 F (36.5 C) 97.9 F (36.6 C) 97.4 F (36.3 C) 98.5 F (36.9 C)  TempSrc: Oral Oral Oral Oral  SpO2: 98%  98% 98%  Weight:  Height:       Gen. exam: Awake, alert, not in any distress Chest: Good air entry bilaterally, no rhonchi or rales CVS: S1-S2 regular, no murmurs Abdomen: Soft, Distended but not very tense-diffusely but mildly tender.  Extremities: ++ edema. Neurology: Non-focal Skin: Extensive subcutaneous ecchymosis in the left medial aspect of the arm (apparently IV infiltrated while PRBC transfusion on 4/25) Wounds: None present   Lab  Data: CBC:  Recent Labs Lab 04/25/16 1745  04/26/16 0330 04/27/16 0309 04/28/16 0414 04/29/16 0219 04/29/16 1837 04/30/16 0247  WBC 19.4*  --  25.2* 33.8* 27.1* 23.5* 26.1* 23.4*  NEUTROABS 17.6*  --  23.4* 32.1* 26.0*  --   --   --   HGB 8.2*  < > 7.6* 7.1* 7.1* 7.0* 7.6* 6.9*  HCT 30.1*  < > 28.6* 26.1* 24.8* 25.4* 27.6* 25.1*  MCV 65.7*  --  65.7* 66.4* 66.5* 66.1* 66.0* 66.1*  PLT 616*  --  457* 427* 366 363 396 365  < > = values in this interval not displayed.  Basic Metabolic Panel:  Recent Labs Lab 04/26/16 0330 04/27/16 0309 04/28/16 0414 04/29/16 0219 04/30/16 0247  NA 136 135 133* 130* 131*  K 4.2 4.9 4.4 4.5 4.1  CL 105 105 101 101 100*  CO2 21* 18* 22 20* 22  GLUCOSE 140* 156* 127* 117* 127*  BUN 12 27* 30* 31* 29*  CREATININE 0.99 1.22* 0.96 0.83 0.88  CALCIUM 7.3* 7.9* 8.0* 7.7* 7.8*    GFR: Estimated Creatinine Clearance: 81.6 mL/min (by C-G formula based on SCr of 0.88 mg/dL).  Liver Function Tests:  Recent Labs Lab 04/25/16 1745 04/26/16 0841 04/27/16 0309 04/28/16 0414 04/29/16 0219  AST 24 14* 19 14* 13*  ALT 12* 9* 9* 10* 7*  ALKPHOS 91 67 63 78 84  BILITOT 1.1 1.3* 1.3* 1.0 0.6  PROT 5.7* 5.0* 5.1* 5.2* 5.3*  ALBUMIN 2.0* 1.6* 2.1* 1.9* 1.8*   No results for input(s): LIPASE, AMYLASE in the last 168 hours. No results for input(s): AMMONIA in the last 168 hours.  Coagulation Profile:  Recent Labs Lab 04/25/16 1920  INR 1.38   Impression: MSSA bacteremia ? 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 Denman George to evaluate later today-to see if patient can tolerate a omental biopsy Have had conversations with the patient's daughter over the past few days-she is aware of potentially poor prognoses-but in need biopsy/staging-before proceeding with palliative evaluation. Continue Ancef-ID recommending for a total of 21 days-currently on day 7  Rest as above  [facility]

## 2016-05-01 ENCOUNTER — Inpatient Hospital Stay (HOSPITAL_COMMUNITY): Payer: Medicaid Other

## 2016-05-01 ENCOUNTER — Encounter (HOSPITAL_COMMUNITY): Payer: Self-pay

## 2016-05-01 DIAGNOSIS — C786 Secondary malignant neoplasm of retroperitoneum and peritoneum: Secondary | ICD-10-CM

## 2016-05-01 LAB — TYPE AND SCREEN
ABO/RH(D): O NEG
Antibody Screen: NEGATIVE
UNIT DIVISION: 0
UNIT DIVISION: 0

## 2016-05-01 LAB — COMPREHENSIVE METABOLIC PANEL
ALK PHOS: 100 U/L (ref 38–126)
ALT: 6 U/L — ABNORMAL LOW (ref 14–54)
AST: 15 U/L (ref 15–41)
Albumin: 1.9 g/dL — ABNORMAL LOW (ref 3.5–5.0)
Anion gap: 10 (ref 5–15)
BILIRUBIN TOTAL: 0.6 mg/dL (ref 0.3–1.2)
BUN: 26 mg/dL — ABNORMAL HIGH (ref 6–20)
CHLORIDE: 100 mmol/L — AB (ref 101–111)
CO2: 23 mmol/L (ref 22–32)
Calcium: 7.9 mg/dL — ABNORMAL LOW (ref 8.9–10.3)
Creatinine, Ser: 0.73 mg/dL (ref 0.44–1.00)
GFR calc Af Amer: >60 mL/min (ref >60)
GFR calc non Af Amer: >60 mL/min (ref >60)
GLUCOSE: 107 mg/dL — AB (ref 65–99)
POTASSIUM: 3.5 mmol/L (ref 3.5–5.1)
Sodium: 133 mmol/L — ABNORMAL LOW (ref 135–145)
TOTAL PROTEIN: 5.4 g/dL — AB (ref 6.5–8.1)

## 2016-05-01 LAB — CBC
HEMATOCRIT: 27.4 % — AB (ref 36.0–46.0)
Hemoglobin: 8.2 g/dL — ABNORMAL LOW (ref 12.0–15.0)
MCH: 20.2 pg — AB (ref 26.0–34.0)
MCHC: 29.9 g/dL — AB (ref 30.0–36.0)
MCV: 67.7 fL — AB (ref 78.0–100.0)
Platelets: 364 10*3/uL (ref 150–400)
RBC: 4.05 MIL/uL (ref 3.87–5.11)
RDW: 23.5 % — AB (ref 11.5–15.5)
WBC: 23.1 10*3/uL — ABNORMAL HIGH (ref 4.0–10.5)

## 2016-05-01 LAB — BPAM RBC
Blood Product Expiration Date: 201805022359
Blood Product Expiration Date: 201805062359
ISSUE DATE / TIME: 201804251119
ISSUE DATE / TIME: 201804260519
Unit Type and Rh: 9500
Unit Type and Rh: 9500

## 2016-05-01 LAB — PROTIME-INR
INR: 1.14
Prothrombin Time: 14.6 seconds (ref 11.4–15.2)

## 2016-05-01 LAB — GLUCOSE, CAPILLARY: GLUCOSE-CAPILLARY: 111 mg/dL — AB (ref 65–99)

## 2016-05-01 MED ORDER — GELATIN ABSORBABLE 12-7 MM EX MISC
CUTANEOUS | Status: AC
  Filled 2016-05-01: qty 1

## 2016-05-01 MED ORDER — FENTANYL CITRATE (PF) 100 MCG/2ML IJ SOLN
INTRAMUSCULAR | Status: AC | PRN
  Administered 2016-05-01: 25 ug via INTRAVENOUS

## 2016-05-01 MED ORDER — LIDOCAINE HCL 1 % IJ SOLN
INTRAMUSCULAR | Status: AC
  Filled 2016-05-01: qty 20

## 2016-05-01 MED ORDER — MIDAZOLAM HCL 2 MG/2ML IJ SOLN
INTRAMUSCULAR | Status: AC | PRN
  Administered 2016-05-01: 1 mg via INTRAVENOUS

## 2016-05-01 MED ORDER — MIDAZOLAM HCL 2 MG/2ML IJ SOLN
INTRAMUSCULAR | Status: AC
  Filled 2016-05-01: qty 2

## 2016-05-01 MED ORDER — FENTANYL CITRATE (PF) 100 MCG/2ML IJ SOLN
INTRAMUSCULAR | Status: AC
  Filled 2016-05-01: qty 2

## 2016-05-01 NOTE — Consult Note (Signed)
Chief Complaint: Patient was seen in consultation today for abdominal mass biopsy Chief Complaint  Patient presents with  . Abdominal Pain   at the request of Dr Dorann Ou  Referring Physician(s): Dr Nena Alexander Dr Dorann Ou  Supervising Physician: Corrie Mckusick  Patient Status: [facility]  History of Present Illness: Allison Haynes is a 61 y.o. female   Abd mass; distension Large volume ascites Poor conditioning and malnutrition 2 previous paracentesis: 04/28/16: 500 cc Gr+ Cx peritonitis                                           04/26/16: 2.5L                                      All neg cytology  Fatigue sxs; distension worsening; satiety; weakness CT 4/21: IMPRESSION: 1. 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 Consultation with Gynecology is recommended. 2. Small left pleural effusion. 3. Aortic atherosclerosis, in addition to 2 vessel coronary artery disease. Please note that although the presence of coronary artery calcium documents the presence of coronary artery disease, the severity of this disease and any potential stenosis cannot be assessed on this non-gated CT examination. Assessment for potential risk factor modification, dietary therapy or pharmacologic therapy may be warranted, if clinically indicated. 4. Cholelithiasis.  Now request for abd mass biopsy Dr Earleen Newport has reviewed imaging and approves procedure  Past Medical History:  Diagnosis Date  . Ovarian cancer ([place]) 04/25/2016    Past Surgical History:  Procedure Laterality Date  . IR PARACENTESIS  04/28/2016    Allergies: Pseudoephedrine  Medications: Prior to Admission medications   Not on File     Family History  Problem Relation Age of Onset  . Diabetes Mellitus II Mother   . Hypertension Mother   . Diabetes Mellitus II Father   . Hypertension Father   . Breast cancer Maternal Aunt   . Breast  cancer Paternal Aunt     Social History   Social History  . Marital status: Widowed    Spouse name: N/A  . Number of children: N/A  . Years of education: N/A   Social History Main Topics  . Smoking status: Never Smoker  . Smokeless tobacco: Never Used  . Alcohol use No  . Drug use: No  . Sexual activity: Not Asked   Other Topics Concern  . None   Social History Narrative  . None    Review of Systems: A 12 point ROS discussed and pertinent positives are indicated in the HPI above.  All other systems are negative.  Review of Systems  Constitutional: Positive for activity change, appetite change, fatigue and unexpected weight change.  Respiratory: Positive for shortness of breath.   Gastrointestinal: Positive for abdominal distention, abdominal pain and nausea.  Musculoskeletal: Positive for gait problem.  Neurological: Positive for weakness.  Psychiatric/Behavioral: Negative for behavioral problems and confusion.    Vital Signs: BP 129/70 (BP Location: Left Wrist)   Pulse 73   Temp 98.5 F (36.9 C) (Oral)   Resp (!) 21   Ht 5\' 5"  (1.651 m)   Wt 201 lb 11.2 oz (91.5 kg)   SpO2 98%   BMI 33.56 kg/m   Physical  Exam  Constitutional: She is oriented to person, place, and time.  Cardiovascular: Normal rate and regular rhythm.   Pulmonary/Chest: Effort normal.  Abdominal: Bowel sounds are normal. She exhibits distension. There is tenderness.  Musculoskeletal: Normal range of motion.  Neurological: She is alert and oriented to person, place, and time.  Skin: Skin is warm and dry.  Psychiatric: She has a normal mood and affect. Her behavior is normal. Judgment and thought content normal.  Nursing note and vitals reviewed.   Mallampati Score:  MD Evaluation Airway: WNL Heart: WNL Abdomen: WNL Chest/ Lungs: WNL ASA  Classification: 3 Mallampati/Airway Score: One  Imaging: Ct Angio Chest Pe W Or Wo Contrast  Result Date: 04/27/2016 CLINICAL DATA:   61 year old female with history of ovarian cancer presenting with tachycardia. EXAM: CT ANGIOGRAPHY CHEST WITH CONTRAST TECHNIQUE: Multidetector CT imaging of the chest was performed using the standard protocol during bolus administration of intravenous contrast. Multiplanar CT image reconstructions and MIPs were obtained to evaluate the vascular anatomy. CONTRAST:  100 cc Isovue 370 COMPARISON:  Chest radiograph dated 04/25/2016 and CT of the abdomen pelvis dated 04/25/2016 FINDINGS: Cardiovascular: There is mild cardiomegaly. No pericardial effusion. There is coronary vascular calcification involving the LAD. The thoracic aorta appears unremarkable. The origins of the great vessels of the aortic arch appear patent. There is dilatation of the main pulmonary trunk suggestive of underlying pulmonary hypertension. Evaluation of the pulmonary arteries is limited due to suboptimal opacification of the peripheral branches. No central pulmonary artery embolus identified. Mediastinum/Nodes: There is no hilar or mediastinal adenopathy. The esophagus and the thyroid gland are grossly unremarkable. Lungs/Pleura: There is a small left pleural effusion. There are consolidative changes of the left lower lobe as well as scattered densities in the left upper lobe. Overall there is volume loss in the left lung with shift of the heart into the left hemithorax. There is segmental air trapping in the right middle lobe with diffuse hazy airspace density in the right upper and right lower lobes. Paramediastinal subpleural density in the right upper lobe likely represents atelectatic changes or pleural thickening. There is no pneumothorax. Upper Abdomen: Ascites is partially visualized. Musculoskeletal: Multilevel degenerative changes of the spine. No acute osseous pathology. Review of the MIP images confirms the above findings. IMPRESSION: 1. No CT evidence of pulmonary artery embolus. 2. Areas of consolidative change involving the left  lower lobe as well as scattered pulmonary densities in the left upper lobe. Findings may represent pneumonia. Correlation with clinical exam recommended. 3. Overall decreased left lung volume with leftward shift of the mediastinum and heart. 4. Segmental air trapping in the right middle lobe with diffuse haziness of the right upper and right lower lobes. 5. Ascites. Electronically Signed   By: Anner Crete M.D.   On: 04/27/2016 05:40   Ct Abdomen Pelvis W Contrast  Result Date: 04/25/2016 CLINICAL DATA:  61 year old female with history of abdominal distension and nausea. No recent vomiting. EXAM: CT ABDOMEN AND PELVIS WITH CONTRAST TECHNIQUE: Multidetector CT imaging of the abdomen and pelvis was performed using the standard protocol following bolus administration of intravenous contrast. CONTRAST:  1 ISOVUE-300 IOPAMIDOL (ISOVUE-300) INJECTION 61% COMPARISON:  No priors. FINDINGS: Lower chest: Air trapping right middle lobe. Small left pleural effusion. Atherosclerotic calcifications in the left anterior descending and left circumflex coronary arteries. Hepatobiliary: No suspicious cystic or solid hepatic lesions. No intra or extrahepatic biliary ductal dilatation. Small calcified gallstones lying dependently in the gallbladder. No findings to suggest an acute  cholecystitis at this time. Pancreas: No pancreatic mass. No pancreatic ductal dilatation. No pancreatic or peripancreatic fluid or inflammatory changes. Spleen: Unremarkable. Adrenals/Urinary Tract: Right kidney and bilateral adrenal glands are normal in appearance. 1.9 cm simple cyst in the interpolar region of the left kidney. No hydroureteronephrosis. Urinary bladder is normal in appearance. Stomach/Bowel: The appearance of the stomach is normal. There is no pathologic dilatation of small bowel or colon. Appendix is not confidently visualized. Vascular/Lymphatic: No significant atherosclerotic disease, aneurysm or dissection identified in the  abdominal or pelvic vasculature. No lymphadenopathy noted in the abdomen or pelvis. Reproductive: Large complex mixed cystic and solid mass which emanates from one or both of the adnexal regions, highly concerning for ovarian neoplasm. This measures approximately 20.5 x 23.1 x 24.6 cm (coronal image 46 of series 6 and sagittal image 65 of series 7), and has mixed cystic and solid components, with some heterogeneous internal calcification. Uterus is also heterogeneous in appearance, with multiple densely calcified lesions, presumably multiple fibroids, largest of which measures 5.5 cm in the anterior aspect of the fundal region. Other: Large volume of ascites. Extensive nodular soft tissue thickening throughout the omentum, compatible with omental caking. Some of this extends through the umbilical region. No pneumoperitoneum. Musculoskeletal: There are no aggressive appearing lytic or blastic lesions noted in the visualized portions of the skeleton. IMPRESSION: 1. 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 Consultation with Gynecology is recommended. 2. Small left pleural effusion. 3. Aortic atherosclerosis, in addition to 2 vessel coronary artery disease. Please note that although the presence of coronary artery calcium documents the presence of coronary artery disease, the severity of this disease and any potential stenosis cannot be assessed on this non-gated CT examination. Assessment for potential risk factor modification, dietary therapy or pharmacologic therapy may be warranted, if clinically indicated. 4. Cholelithiasis. Electronically Signed   By: Vinnie Langton M.D.   On: 04/25/2016 18:59   US Paracentesis  Result Date: 04/26/2016 INDICATION: Patient with abdominal pain and large complex solid/ cystic adnexal mass, ascites, omental thickening concerning for ovarian cancer; request received for diagnostic and  therapeutic paracentesis. EXAM: ULTRASOUND GUIDED DIAGNOSTIC AND THERAPEUTIC PARACENTESIS MEDICATIONS: None. COMPLICATIONS: None immediate. PROCEDURE: Informed written consent was obtained from the patient after a discussion of the risks, benefits and alternatives to treatment. A timeout was performed prior to the initiation of the procedure. Initial ultrasound scanning demonstrates a moderate amount of ascites within the left mid to lower abdominal quadrant. The left mid to lower abdomen was prepped and draped in the usual sterile fashion. 1% lidocaine was used for local anesthesia. Following this, a Yueh catheter was introduced. An ultrasound image was saved for documentation purposes. The paracentesis was performed. The catheter was removed and a dressing was applied. The patient tolerated the procedure well without immediate post procedural complication. FINDINGS: A total of approximately 2.5 liters of turbid, bloody fluid was removed. Samples were sent to the laboratory as requested by the clinical team. IMPRESSION: Successful ultrasound-guided diagnostic and therapeutic paracentesis yielding 2.5 liters of peritoneal fluid. Read by: Rowe Robert, PA-C Electronically Signed   By: Corrie Mckusick D.O.   On: 04/26/2016 13:58   Dg Chest Port 1 View  Result Date: 04/25/2016 CLINICAL DATA:  Abdominal distension with fever EXAM: PORTABLE CHEST 1 VIEW COMPARISON:  None. FINDINGS: Low lung volumes. Non inclusion of the left CP angle. Borderline to mild cardiomegaly, exaggerated by low lung volume. Mild atherosclerosis. No focal  consolidation. No pneumothorax. IMPRESSION: Low lung volumes, without infiltrate or edema. Borderline to mild cardiomegaly. Electronically Signed   By: Donavan Foil M.D.   On: 04/25/2016 17:54   Ir Paracentesis  Result Date: 04/28/2016 INDICATION: Patient with ascites. Request is made for diagnostic and therapeutic paracentesis. EXAM: ULTRASOUND GUIDED DIAGNOSTIC AND THERAPEUTIC  PARACENTESIS MEDICATIONS: 10 mL 1% lidocaine COMPLICATIONS: None immediate. PROCEDURE: Informed written consent was obtained from the patient after a discussion of the risks, benefits and alternatives to treatment. A timeout was performed prior to the initiation of the procedure. Initial ultrasound scanning demonstrates a large amount of ascites within the right lateral abdomen. The right lateral abdomen was prepped and draped in the usual sterile fashion. 1% lidocaine was used for local anesthesia. Following this, a 19 gauge, 7-cm, Yueh catheter was introduced. An ultrasound image was saved for documentation purposes. The paracentesis was performed. The catheter was removed and a dressing was applied. The patient tolerated the procedure well without immediate post procedural complication. FINDINGS: A total of approximately 530 mL of thick, amber fluid was removed. Samples were sent to the laboratory as requested by the clinical team. IMPRESSION: Successful ultrasound-guided diagnostic and therapeutic paracentesis yielding 530 liters of peritoneal fluid. Read by:  Brynda Greathouse PA-C Electronically Signed   By: Corrie Mckusick D.O.   On: 04/28/2016 13:59    Labs:  CBC:  Recent Labs  04/29/16 0219 04/29/16 1837 04/30/16 0247 05/01/16 0248  WBC 23.5* 26.1* 23.4* 23.1*  HGB 7.0* 7.6* 6.9* 8.2*  HCT 25.4* 27.6* 25.1* 27.4*  PLT 363 396 365 364    COAGS:  Recent Labs  04/25/16 1920  INR 1.38    BMP:  Recent Labs  04/28/16 0414 04/29/16 0219 04/30/16 0247 05/01/16 0248  NA 133* 130* 131* 133*  K 4.4 4.5 4.1 3.5  CL 101 101 100* 100*  CO2 22 20* 22 23  GLUCOSE 127* 117* 127* 107*  BUN 30* 31* 29* 26*  CALCIUM 8.0* 7.7* 7.8* 7.9*  CREATININE 0.96 0.83 0.88 0.73  GFRNONAA >60 >60 >60 >60  GFRAA >60 >60 >60 >60    LIVER FUNCTION TESTS:  Recent Labs  04/27/16 0309 04/28/16 0414 04/29/16 0219 05/01/16 0248  BILITOT 1.3* 1.0 0.6 0.6  AST 19 14* 13* 15  ALT 9* 10* 7* 6*    ALKPHOS 63 78 84 100  PROT 5.1* 5.2* 5.3* 5.4*  ALBUMIN 2.1* 1.9* 1.8* 1.9*    TUMOR MARKERS:  Recent Labs  04/27/16 0309  CEA 1.1  CA199 14    Assessment and Plan:  Abdominal distension; ascites 2 paracentesis: Gr + peritonitis Cytology neg CT reveals abdominal mass Now for biopsy of same Risks and Benefits discussed with the patient including, but not limited to bleeding, infection, damage to adjacent structures or low yield requiring additional tests. All of the patient's questions were answered, patient is agreeable to proceed. Consent signed and in chart.   Thank you for this interesting consult.  I greatly enjoyed meeting Allison Haynes and look forward to participating in their care.  A copy of this report was sent to the requesting provider on this date.  Electronically Signed: Monia Sabal A 05/01/2016, 9:22 AM   I spent a total of 40 Minutes    in face to face in clinical consultation, greater than 50% of which was counseling/coordinating care for abd mass bx

## 2016-05-01 NOTE — Progress Notes (Addendum)
PROGRESS NOTE        PATIENT DETAILS Name: Allison Haynes Age: 61 y.o. Sex: female Date of Birth: 01-Oct-1955 Admit Date: 04/25/2016 Admitting Physician Vianne Bulls, MD PCP:No primary care provider on file.  Brief Narrative: Patient is a 61 y.o. female with no significant medical history-presented to the ED on 4/21 with worsening abdominal pain,distention and fever. CT scan of the abdomen showed a large complex cystic/solid mass emanating from one of both of the adnexal regions with peritoneal/omental caking and potential malignant ascites. She underwent paracentesis 2, with negative cytology. Her initial paracentesis on 4/22, showed gram-positive cocci, but cultures were negative. Blood cultures done on admission was positive for MSSA. 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 Thankfully, with empiric antimicrobial therapy-sepsis pathophysiology has resolved, and she is slowly improving. Please note, hospital course has been complicated by a brief episode of A. fib RVR, multifactorial anemia requiring PRBC 3, and volume overload. See below for further details.  Note: Patient has not seen a physician or sought routine medical in the outpatient setting for approximately 25 years!  Subjective: Abdomen is still distended-but with unchanged tenderness.   She otherwise appears stable-with no chest pain or shortness of breath.   Multiple BMs yesterday-not loose or watery.  Telemetry (personally reviewed): brief episode of narrow complex tachycardia yesterday, but mostly in sinus rhythm.  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 Although Gram stain on  4/22 paracentesis/ascites fluid was positive for gram-positive cocci, ascites fluid cultures on 4/22 and on 4/24 continued to be negative. Blood cultures on 4/21-1 set, was positive for MSSA, repeat cultures are negative. Echocardiogram showed preserved EF without any signs of vegetations. Although, it is still felt has ovarian malignancy with ascites-given negative cytology-and sepsis on presentation with staph bacteremia-there is some concern whether this pelvic mass is actually related to some sort of infection-hence a peritoneal/omental biopsy with cultures have been ordered for today. Family reports, patient's living conditions were very poor-and they are concerned that she could be infected with atypical organisms.  Infectious disease continues to follow. As noted on my note on 4/23, no evidence of bowel perforation on CT chest.Continue stepdown monitoring, continue Ancef and follow leukocytosis trend.  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 Unfortunately, cytology negative 2-even though CA 125 elevated, they are not high as expected in malignancy. Given sepsis on presentation/peritonitis-and negative cytology-and diagnostic uncertainty, after discussion with GYN oncology, a peritoneal/mental biopsy with cultures has been ordered. Cultures and pathology have been ordered  Paroxysmal atrial fibrillation: RVR occurred on 4/23-since then has been mostly maintaining sinus rhythm and had a brief run of narrow complex tachycardia on 4/26. Remains stable on metoprolol, EF showed preserved EF with grade 2 diastolic dysfunction.SinceChads2vasc score of 1 pt will not anticoagulate, especially given her severe anemia.   Significant volume overload with lower extremity edema: Doubt this is from CHF-I suspect this is IV fluid resuscitation, pelvic  compression from ascites/mass and hypoalbuminemia-has been started on IV Lasix on 4/26, will try and attempt to keep  in negative balance as much as possible. Not sure if diuretics will work if this is malignant ascites. Currently more than +4 L. Awaiting lower extremity Doppler.  Anemia of chronic disease: Suspect mostly has anemia of chronic disease with elements of vitamin B12 deficiency and some iron deficiency (soluble transferrin receptor assay slightly increased). Anemia has worsened due to critical illness as well. She has required 3 units of PRBC so far, she has been started on vitamin B12 and iron supplementation. Trend hemoglobin periodically.   Minimally elevatedTroponin level: Likely secondary to demand ischemia-doubt ACS. Given issues with malignancy. severity of anemia-doubt further workup needed at this time. Echo showed preserved EF without any wall motion abnormalities  Left arm ecchymosis: Occurred when IV infiltrated when she was receiving of PRBC transfusion. This appears stable-ecchymosis mostly appears to be very superficial.  Thrombocytosis: Likely reactive-due to underlying malignancy/chronic inflammation.  Moderate protein calorie malnutrition: Seen by nutrition, continue supplements.  Scalp mass: Per patient is has been there for the past 12 years-see pictures below. I do not think this is related to her presentation. She will likely require a biopsy-at some point-probably as an outpatient. See pictures below.   DVT Prophylaxis: Prophylactic Lovenox   Code Status: Full code   Family Communication: None at bedside-had spoken extensively with the patient's daughter yesterday.  Disposition Plan: Remain inpatient-remain in SDU  Antimicrobial agents: Anti-infectives    Start     Dose/Rate Route Frequency Ordered Stop   04/28/16 0500  vancomycin (VANCOCIN) 1,500 mg in sodium chloride 0.9 % 500 mL IVPB  Status:  Discontinued     1,500 mg 250 mL/hr over 120 Minutes  Intravenous Every 24 hours 04/27/16 0908 04/27/16 1544   04/27/16 1700  ceFAZolin (ANCEF) IVPB 1 g/50 mL premix  Status:  Discontinued     1 g 100 mL/hr over 30 Minutes Intravenous Every 8 hours 04/27/16 1544 04/27/16 1548   04/27/16 1700  ceFAZolin (ANCEF) IVPB 2g/100 mL premix     2 g 200 mL/hr over 30 Minutes Intravenous Every 8 hours 04/27/16 1548     04/26/16 1700  vancomycin (VANCOCIN) 1,250 mg in sodium chloride 0.9 % 250 mL IVPB  Status:  Discontinued     1,250 mg 166.7 mL/hr over 90 Minutes Intravenous Every 12 hours 04/26/16 0908 04/27/16 0908   04/26/16 0200  piperacillin-tazobactam (ZOSYN) IVPB 3.375 g  Status:  Discontinued     3.375 g 12.5 mL/hr over 240 Minutes Intravenous Every 8 hours 04/25/16 1827 04/27/16 1544   04/25/16 2300  vancomycin (VANCOCIN) IVPB 1000 mg/200 mL premix  Status:  Discontinued     1,000 mg 200 mL/hr over 60 Minutes Intravenous Every 8 hours 04/25/16 2247 04/26/16 0908   04/25/16 1745  piperacillin-tazobactam (ZOSYN) IVPB 3.375 g     3.375 g 100 mL/hr over 30 Minutes Intravenous  Once 04/25/16 1738 04/25/16 1854      Procedures: None  CONSULTS:  GYN Oncology  Time spent: 30-minutes-Greater than 50% of this time was spent in counseling, explanation of diagnosis, planning of further management, and coordination of care.  MEDICATIONS: Scheduled Meds: . acetaminophen  650 mg Rectal Once  . cyanocobalamin  1,000 mcg Subcutaneous Daily  . [START ON 05/02/2016] enoxaparin (LOVENOX) injection  40 mg Subcutaneous Q24H  . ferrous sulfate  325 mg Oral BID WC  . furosemide  40 mg Intravenous BID  . metoprolol tartrate  50 mg Oral BID  . multivitamin with minerals  1 tablet Oral Daily  .  polyethylene glycol  17 g Oral Daily  . potassium chloride  20 mEq Oral Daily  . protein supplement shake  11 oz Oral TID BM  . sodium chloride flush  3 mL Intravenous Q12H   Continuous Infusions: . sodium chloride 10 mL/hr at 04/30/16 2300  .  ceFAZolin (ANCEF)  IV Stopped (05/01/16 0915)   PRN Meds:.acetaminophen **OR** acetaminophen, metoprolol, oxyCODONE, promethazine   PHYSICAL EXAM: Vital signs: Vitals:   05/01/16 0000 05/01/16 0347 05/01/16 0735 05/01/16 0823  BP: 125/70 (!) 133/58 129/70   Pulse: 72 71 73   Resp: (!) 21 (!) 22 (!) 21   Temp: 98.9 F (37.2 C) 98.9 F (37.2 C) 98.5 F (36.9 C)   TempSrc: Oral Oral Oral   SpO2: 99% 97% 98%   Weight:    91.5 kg (201 lb 11.2 oz)  Height:       Filed Weights   04/28/16 0500 04/29/16 0500 05/01/16 0823  Weight: 91.6 kg (201 lb 15.1 oz) 104.4 kg (230 lb 2.6 oz) 91.5 kg (201 lb 11.2 oz)   Body mass index is 33.56 kg/m.   General appearance :Awake, alert, not in any distress. Chronically sick appearing. Eyes:, pupils equally reactive to light and accomodation,no scleral icterus. HEENT: Atraumatic and Normocephalic Neck: supple, no JVD. Resp:Good air entry bilaterally CVS: S1 S2 regular, no murmurs.  GI: Remains distended but not tense. Mildly but diffusely tender all over Extremities: B/L Lower Ext shows +++ edema, both legs are warm to touch Neurology:  speech clear,Non focal, sensation is grossly intact. Psychiatric: Normal judgment and insight. Normal mood. Musculoskeletal:No digital cyanosis Skin:No Rash, warm and dry Wounds:N/A   Note take on 4/27 after patient's permission.    I have personally reviewed following labs and imaging studies  LABORATORY DATA: CBC:  Recent Labs Lab 04/25/16 1745  04/26/16 0330 04/27/16 0309 04/28/16 0414 04/29/16 0219 04/29/16 1837 04/30/16 0247 05/01/16 0248  WBC 19.4*  --  25.2* 33.8* 27.1* 23.5* 26.1* 23.4* 23.1*  NEUTROABS 17.6*  --  23.4* 32.1* 26.0*  --   --   --   --   HGB 8.2*  < > 7.6* 7.1* 7.1* 7.0* 7.6* 6.9* 8.2*  HCT 30.1*  < > 28.6* 26.1* 24.8* 25.4* 27.6* 25.1* 27.4*  MCV 65.7*  --  65.7* 66.4* 66.5* 66.1* 66.0* 66.1* 67.7*  PLT 616*  --  457* 427* 366 363 396 365 364  < > = values in this interval not  displayed.  Basic Metabolic Panel:  Recent Labs Lab 04/27/16 0309 04/28/16 0414 04/29/16 0219 04/30/16 0247 05/01/16 0248  NA 135 133* 130* 131* 133*  K 4.9 4.4 4.5 4.1 3.5  CL 105 101 101 100* 100*  CO2 18* 22 20* 22 23  GLUCOSE 156* 127* 117* 127* 107*  BUN 27* 30* 31* 29* 26*  CREATININE 1.22* 0.96 0.83 0.88 0.73  CALCIUM 7.9* 8.0* 7.7* 7.8* 7.9*    GFR: Estimated Creatinine Clearance: 83.6 mL/min (by C-G formula based on SCr of 0.73 mg/dL).  Liver Function Tests:  Recent Labs Lab 04/26/16 0841 04/27/16 0309 04/28/16 0414 04/29/16 0219 05/01/16 0248  AST 14* 19 14* 13* 15  ALT 9* 9* 10* 7* 6*  ALKPHOS 67 63 78 84 100  BILITOT 1.3* 1.3* 1.0 0.6 0.6  PROT 5.0* 5.1* 5.2* 5.3* 5.4*  ALBUMIN 1.6* 2.1* 1.9* 1.8* 1.9*   No results for input(s): LIPASE, AMYLASE in the last 168 hours. No results for input(s): AMMONIA in the last 168  hours.  Coagulation Profile:  Recent Labs Lab 04/25/16 1920  INR 1.38    Cardiac Enzymes:  Recent Labs Lab 04/27/16 0309  TROPONINI 0.11*    BNP (last 3 results) No results for input(s): PROBNP in the last 8760 hours.  HbA1C: No results for input(s): HGBA1C in the last 72 hours.  CBG:  Recent Labs Lab 04/27/16 0745 04/28/16 0844 04/29/16 0805 04/30/16 0802 05/01/16 0733  GLUCAP 135* 118* 111* 114* 111*    Lipid Profile: No results for input(s): CHOL, HDL, LDLCALC, TRIG, CHOLHDL, LDLDIRECT in the last 72 hours.  Thyroid Function Tests: No results for input(s): TSH, T4TOTAL, FREET4, T3FREE, THYROIDAB in the last 72 hours.  Anemia Panel: No results for input(s): VITAMINB12, FOLATE, FERRITIN, TIBC, IRON, RETICCTPCT in the last 72 hours.  Urine analysis:    Component Value Date/Time   COLORURINE AMBER (A) 04/26/2016 0530   APPEARANCEUR CLEAR 04/26/2016 0530   LABSPEC >1.046 (H) 04/26/2016 0530   PHURINE 5.0 04/26/2016 0530   GLUCOSEU NEGATIVE 04/26/2016 0530   HGBUR NEGATIVE 04/26/2016 0530   BILIRUBINUR  NEGATIVE 04/26/2016 0530   KETONESUR NEGATIVE 04/26/2016 0530   PROTEINUR NEGATIVE 04/26/2016 0530   NITRITE NEGATIVE 04/26/2016 0530   LEUKOCYTESUR NEGATIVE 04/26/2016 0530    Sepsis Labs: Lactic Acid, Venous    Component Value Date/Time   LATICACIDVEN 1.4 04/27/2016 0309    MICROBIOLOGY: Recent Results (from the past 240 hour(s))  Blood Culture (routine x 2)     Status: None   Collection Time: 04/25/16  5:54 PM  Result Value Ref Range Status   Specimen Description BLOOD RIGHT WRIST  Final   Special Requests IN PEDIATRIC BOTTLE Blood Culture adequate volume  Final   Culture NO GROWTH 5 DAYS  Final   Report Status 04/30/2016 FINAL  Final  Blood Culture (routine x 2)     Status: Abnormal   Collection Time: 04/25/16  5:58 PM  Result Value Ref Range Status   Specimen Description BLOOD RIGHT ANTECUBITAL  Final   Special Requests   Final    BOTTLES DRAWN AEROBIC AND ANAEROBIC Blood Culture adequate volume   Culture  Setup Time   Final    GRAM POSITIVE COCCI IN CLUSTERS AEROBIC BOTTLE ONLY Organism ID to follow CRITICAL RESULT CALLED TO, READ BACK BY AND VERIFIED WITH: AElta [place].D. 12:00 04/27/16 ([email or web address])    Culture STAPHYLOCOCCUS AUREUS (A)  Final   Report Status 04/29/2016 FINAL  Final   Organism ID, Bacteria STAPHYLOCOCCUS AUREUS  Final      Susceptibility   Staphylococcus aureus - MIC*    CIPROFLOXACIN <=0.5 SENSITIVE Sensitive     ERYTHROMYCIN <=0.25 SENSITIVE Sensitive     GENTAMICIN <=0.5 SENSITIVE Sensitive     OXACILLIN <=0.25 SENSITIVE Sensitive     TETRACYCLINE <=1 SENSITIVE Sensitive     VANCOMYCIN 1 SENSITIVE Sensitive     TRIMETH/SULFA <=10 SENSITIVE Sensitive     CLINDAMYCIN <=0.25 SENSITIVE Sensitive     RIFAMPIN <=0.5 SENSITIVE Sensitive     Inducible Clindamycin NEGATIVE Sensitive     * STAPHYLOCOCCUS AUREUS  Blood Culture ID Panel (Reflexed)     Status: Abnormal   Collection Time: 04/25/16  5:58 PM  Result Value Ref Range Status    Enterococcus species NOT DETECTED NOT DETECTED Final   Listeria monocytogenes NOT DETECTED NOT DETECTED Final   Staphylococcus species DETECTED (A) NOT DETECTED Final    Comment: CRITICAL RESULT CALLED TO, READ BACK BY AND VERIFIED WITH: AElta [place].D. 12:00 04/27/16 (  [email or web address])    Staphylococcus aureus DETECTED (A) NOT DETECTED Final    Comment: Methicillin (oxacillin) susceptible Staphylococcus aureus (MSSA). Preferred therapy is anti staphylococcal beta lactam antibiotic (Cefazolin or Nafcillin), unless clinically contraindicated. CRITICAL RESULT CALLED TO, READ BACK BY AND VERIFIED WITH: AElta [place].D. 12:00 04/27/16 ([email or web address])    Methicillin resistance NOT DETECTED NOT DETECTED Final   Streptococcus species NOT DETECTED NOT DETECTED Final   Streptococcus agalactiae NOT DETECTED NOT DETECTED Final   Streptococcus pneumoniae NOT DETECTED NOT DETECTED Final   Streptococcus pyogenes NOT DETECTED NOT DETECTED Final   Acinetobacter baumannii NOT DETECTED NOT DETECTED Final   Enterobacteriaceae species NOT DETECTED NOT DETECTED Final   Enterobacter cloacae complex NOT DETECTED NOT DETECTED Final   Escherichia coli NOT DETECTED NOT DETECTED Final   Klebsiella oxytoca NOT DETECTED NOT DETECTED Final   Klebsiella pneumoniae NOT DETECTED NOT DETECTED Final   Proteus species NOT DETECTED NOT DETECTED Final   Serratia marcescens NOT DETECTED NOT DETECTED Final   Haemophilus influenzae NOT DETECTED NOT DETECTED Final   Neisseria meningitidis NOT DETECTED NOT DETECTED Final   Pseudomonas aeruginosa NOT DETECTED NOT DETECTED Final   Candida albicans NOT DETECTED NOT DETECTED Final   Candida glabrata NOT DETECTED NOT DETECTED Final   Candida krusei NOT DETECTED NOT DETECTED Final   Candida parapsilosis NOT DETECTED NOT DETECTED Final   Candida tropicalis NOT DETECTED NOT DETECTED Final  MRSA PCR Screening     Status: None   Collection Time: 04/25/16 10:25 PM  Result Value Ref Range  Status   MRSA by PCR NEGATIVE NEGATIVE Final    Comment:        The GeneXpert MRSA Assay (FDA approved for NASAL specimens only), is one component of a comprehensive MRSA colonization surveillance program. It is not intended to diagnose MRSA infection nor to guide or monitor treatment for MRSA infections.   Urine culture     Status: None   Collection Time: 04/26/16  5:30 AM  Result Value Ref Range Status   Specimen Description URINE, RANDOM  Final   Special Requests NONE  Final   Culture NO GROWTH  Final   Report Status 04/27/2016 FINAL  Final  Culture, body fluid-bottle     Status: None (Preliminary result)   Collection Time: 04/26/16  1:36 PM  Result Value Ref Range Status   Specimen Description FLUID  Final   Special Requests PERITONEAL  Final   Gram Stain   Final    GRAM POSITIVE COCCI IN CLUSTERS ANAEROBIC BOTTLE ONLY CRITICAL RESULT CALLED TO, READ BACK BY AND VERIFIED WITH: CCherylann Parr RN, AT 1347 04/30/16 BY D. VANHOOK    Culture GRAM POSITIVE COCCI  Final   Report Status PENDING  Incomplete  Gram stain     Status: None   Collection Time: 04/26/16  1:36 PM  Result Value Ref Range Status   Specimen Description FLUID  Final   Special Requests PERITONEAL  Final   Gram Stain   Final    WBC PRESENT, PREDOMINANTLY PMN GRAM POSITIVE COCCI IN CLUSTERS IN PAIRS    Report Status 04/26/2016 FINAL  Final  Culture, blood (Routine X 2) w Reflex to ID Panel     Status: None (Preliminary result)   Collection Time: 04/28/16  4:14 AM  Result Value Ref Range Status   Specimen Description BLOOD LEFT ARM  Final   Special Requests AEROBIC BOTTLE ONLY Blood Culture adequate volume  Final   Culture NO GROWTH 2 DAYS  Final  Report Status PENDING  Incomplete  Culture, body fluid-bottle     Status: None (Preliminary result)   Collection Time: 04/28/16 11:40 AM  Result Value Ref Range Status   Specimen Description FLUID PERITONEAL  Final   Special Requests NONE  Final   Culture NO  GROWTH 2 DAYS  Final   Report Status PENDING  Incomplete  Gram stain     Status: None   Collection Time: 04/28/16 11:40 AM  Result Value Ref Range Status   Specimen Description FLUID PERITONEAL  Final   Special Requests NONE  Final   Gram Stain   Final    ABUNDANT WBC PRESENT,BOTH PMN AND MONONUCLEAR NO ORGANISMS SEEN    Report Status 04/29/2016 FINAL  Final    RADIOLOGY STUDIES/RESULTS: Ct Angio Chest Pe W Or Wo Contrast  Result Date: 04/27/2016 CLINICAL DATA:  61 year old female with history of ovarian cancer presenting with tachycardia. EXAM: CT ANGIOGRAPHY CHEST WITH CONTRAST TECHNIQUE: Multidetector CT imaging of the chest was performed using the standard protocol during bolus administration of intravenous contrast. Multiplanar CT image reconstructions and MIPs were obtained to evaluate the vascular anatomy. CONTRAST:  100 cc Isovue 370 COMPARISON:  Chest radiograph dated 04/25/2016 and CT of the abdomen pelvis dated 04/25/2016 FINDINGS: Cardiovascular: There is mild cardiomegaly. No pericardial effusion. There is coronary vascular calcification involving the LAD. The thoracic aorta appears unremarkable. The origins of the great vessels of the aortic arch appear patent. There is dilatation of the main pulmonary trunk suggestive of underlying pulmonary hypertension. Evaluation of the pulmonary arteries is limited due to suboptimal opacification of the peripheral branches. No central pulmonary artery embolus identified. Mediastinum/Nodes: There is no hilar or mediastinal adenopathy. The esophagus and the thyroid gland are grossly unremarkable. Lungs/Pleura: There is a small left pleural effusion. There are consolidative changes of the left lower lobe as well as scattered densities in the left upper lobe. Overall there is volume loss in the left lung with shift of the heart into the left hemithorax. There is segmental air trapping in the right middle lobe with diffuse hazy airspace density in the  right upper and right lower lobes. Paramediastinal subpleural density in the right upper lobe likely represents atelectatic changes or pleural thickening. There is no pneumothorax. Upper Abdomen: Ascites is partially visualized. Musculoskeletal: Multilevel degenerative changes of the spine. No acute osseous pathology. Review of the MIP images confirms the above findings. IMPRESSION: 1. No CT evidence of pulmonary artery embolus. 2. Areas of consolidative change involving the left lower lobe as well as scattered pulmonary densities in the left upper lobe. Findings may represent pneumonia. Correlation with clinical exam recommended. 3. Overall decreased left lung volume with leftward shift of the mediastinum and heart. 4. Segmental air trapping in the right middle lobe with diffuse haziness of the right upper and right lower lobes. 5. Ascites. Electronically Signed   By: Anner Crete M.D.   On: 04/27/2016 05:40   Ct Abdomen Pelvis W Contrast  Result Date: 04/25/2016 CLINICAL DATA:  61 year old female with history of abdominal distension and nausea. No recent vomiting. EXAM: CT ABDOMEN AND PELVIS WITH CONTRAST TECHNIQUE: Multidetector CT imaging of the abdomen and pelvis was performed using the standard protocol following bolus administration of intravenous contrast. CONTRAST:  1 ISOVUE-300 IOPAMIDOL (ISOVUE-300) INJECTION 61% COMPARISON:  No priors. FINDINGS: Lower chest: Air trapping right middle lobe. Small left pleural effusion. Atherosclerotic calcifications in the left anterior descending and left circumflex coronary arteries. Hepatobiliary: No suspicious cystic or solid hepatic  lesions. No intra or extrahepatic biliary ductal dilatation. Small calcified gallstones lying dependently in the gallbladder. No findings to suggest an acute cholecystitis at this time. Pancreas: No pancreatic mass. No pancreatic ductal dilatation. No pancreatic or peripancreatic fluid or inflammatory changes. Spleen:  Unremarkable. Adrenals/Urinary Tract: Right kidney and bilateral adrenal glands are normal in appearance. 1.9 cm simple cyst in the interpolar region of the left kidney. No hydroureteronephrosis. Urinary bladder is normal in appearance. Stomach/Bowel: The appearance of the stomach is normal. There is no pathologic dilatation of small bowel or colon. Appendix is not confidently visualized. Vascular/Lymphatic: No significant atherosclerotic disease, aneurysm or dissection identified in the abdominal or pelvic vasculature. No lymphadenopathy noted in the abdomen or pelvis. Reproductive: Large complex mixed cystic and solid mass which emanates from one or both of the adnexal regions, highly concerning for ovarian neoplasm. This measures approximately 20.5 x 23.1 x 24.6 cm (coronal image 46 of series 6 and sagittal image 65 of series 7), and has mixed cystic and solid components, with some heterogeneous internal calcification. Uterus is also heterogeneous in appearance, with multiple densely calcified lesions, presumably multiple fibroids, largest of which measures 5.5 cm in the anterior aspect of the fundal region. Other: Large volume of ascites. Extensive nodular soft tissue thickening throughout the omentum, compatible with omental caking. Some of this extends through the umbilical region. No pneumoperitoneum. Musculoskeletal: There are no aggressive appearing lytic or blastic lesions noted in the visualized portions of the skeleton. IMPRESSION: 1. 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 Consultation with Gynecology is recommended. 2. Small left pleural effusion. 3. Aortic atherosclerosis, in addition to 2 vessel coronary artery disease. Please note that although the presence of coronary artery calcium documents the presence of coronary artery disease, the severity of this disease and any potential stenosis cannot be  assessed on this non-gated CT examination. Assessment for potential risk factor modification, dietary therapy or pharmacologic therapy may be warranted, if clinically indicated. 4. Cholelithiasis. Electronically Signed   By: Vinnie Langton M.D.   On: 04/25/2016 18:59   US Paracentesis  Result Date: 04/26/2016 INDICATION: Patient with abdominal pain and large complex solid/ cystic adnexal mass, ascites, omental thickening concerning for ovarian cancer; request received for diagnostic and therapeutic paracentesis. EXAM: ULTRASOUND GUIDED DIAGNOSTIC AND THERAPEUTIC PARACENTESIS MEDICATIONS: None. COMPLICATIONS: None immediate. PROCEDURE: Informed written consent was obtained from the patient after a discussion of the risks, benefits and alternatives to treatment. A timeout was performed prior to the initiation of the procedure. Initial ultrasound scanning demonstrates a moderate amount of ascites within the left mid to lower abdominal quadrant. The left mid to lower abdomen was prepped and draped in the usual sterile fashion. 1% lidocaine was used for local anesthesia. Following this, a Yueh catheter was introduced. An ultrasound image was saved for documentation purposes. The paracentesis was performed. The catheter was removed and a dressing was applied. The patient tolerated the procedure well without immediate post procedural complication. FINDINGS: A total of approximately 2.5 liters of turbid, bloody fluid was removed. Samples were sent to the laboratory as requested by the clinical team. IMPRESSION: Successful ultrasound-guided diagnostic and therapeutic paracentesis yielding 2.5 liters of peritoneal fluid. Read by: Rowe Robert, PA-C Electronically Signed   By: Corrie Mckusick D.O.   On: 04/26/2016 13:58   Dg Chest Port 1 View  Result Date: 04/25/2016 CLINICAL DATA:  Abdominal distension with fever EXAM: PORTABLE CHEST 1 VIEW COMPARISON:  None. FINDINGS: Low  lung volumes. Non inclusion of the left CP  angle. Borderline to mild cardiomegaly, exaggerated by low lung volume. Mild atherosclerosis. No focal consolidation. No pneumothorax. IMPRESSION: Low lung volumes, without infiltrate or edema. Borderline to mild cardiomegaly. Electronically Signed   By: Donavan Foil M.D.   On: 04/25/2016 17:54   Ir Paracentesis  Result Date: 04/28/2016 INDICATION: Patient with ascites. Request is made for diagnostic and therapeutic paracentesis. EXAM: ULTRASOUND GUIDED DIAGNOSTIC AND THERAPEUTIC PARACENTESIS MEDICATIONS: 10 mL 1% lidocaine COMPLICATIONS: None immediate. PROCEDURE: Informed written consent was obtained from the patient after a discussion of the risks, benefits and alternatives to treatment. A timeout was performed prior to the initiation of the procedure. Initial ultrasound scanning demonstrates a large amount of ascites within the right lateral abdomen. The right lateral abdomen was prepped and draped in the usual sterile fashion. 1% lidocaine was used for local anesthesia. Following this, a 19 gauge, 7-cm, Yueh catheter was introduced. An ultrasound image was saved for documentation purposes. The paracentesis was performed. The catheter was removed and a dressing was applied. The patient tolerated the procedure well without immediate post procedural complication. FINDINGS: A total of approximately 530 mL of thick, amber fluid was removed. Samples were sent to the laboratory as requested by the clinical team. IMPRESSION: Successful ultrasound-guided diagnostic and therapeutic paracentesis yielding 530 liters of peritoneal fluid. Read by:  Brynda Greathouse PA-C Electronically Signed   By: Corrie Mckusick D.O.   On: 04/28/2016 13:59     LOS: 6 days   Oren Binet, MD  [facility] Pager:[phone]  If 7PM-7AM, please contact night-coverage [email or web address] Password [facility] 05/01/2016, 9:57 AM

## 2016-05-01 NOTE — Progress Notes (Signed)
PT Cancellation Note  Patient Details Name: Allison Haynes MRN: [id number] DOB: December 05, 1955   Cancelled Treatment:    Reason Eval/Treat Not Completed: Patient at procedure or test/unavailable PT will continue to follow acutely.    [place] April, PTA Pager: [phone]   05/01/2016, 2:35 PM

## 2016-05-01 NOTE — NC FL2 (Signed)
[place] LEVEL OF CARE SCREENING TOOL     IDENTIFICATION  Patient Name: Allison Haynes Birthdate: February 10, 1955 Sex: female Admission Date (Current Location): 04/25/2016  [facility] and [place] Number:  Herbalist and Address:  [facility] [place], [place] [address] [place], [place] [address]      Provider Number: [id number]  Attending Physician Name and Address:  Jonetta Osgood, MD  Relative Name and Phone Number:       Current Level of Care: Hospital Recommended Level of Care: [place] Prior Approval Number:    Date Approved/Denied:   PASRR Number: [id number]  Discharge Plan: SNF    Current Diagnoses: Patient Active Problem List   Diagnosis Date Noted  . Malnutrition of moderate degree 04/27/2016  . Severe malnutrition ([place])   . Sepsis ([place]) 04/25/2016  . Ovarian cancer ([place]) 04/25/2016  . Malignant ascites 04/25/2016  . Microcytic anemia 04/25/2016  . Thrombocytosis ([place]) 04/25/2016  . Hyponatremia 04/25/2016    Orientation RESPIRATION BLADDER Height & Weight     Self, Time, Situation, Place  Normal Continent Weight: 201 lb 11.2 oz (91.5 kg) Height:  5\' 5"  (165.1 cm)  BEHAVIORAL SYMPTOMS/MOOD NEUROLOGICAL BOWEL NUTRITION STATUS      Continent Diet (cardiac)  AMBULATORY STATUS COMMUNICATION OF NEEDS Skin   Supervision Verbally Normal                       Personal Care Assistance Level of Assistance  Bathing, Dressing Bathing Assistance: Limited assistance   Dressing Assistance: Limited assistance     Functional Limitations Info             SPECIAL CARE FACTORS FREQUENCY  PT (By licensed PT)     PT Frequency: Not yet assessed            Contractures      Additional Factors Info  Allergies, Code Status Code Status Info: full Allergies Info: Pseudoephedrine           Current Medications (05/01/2016):  This is the current hospital active medication list Current  Facility-Administered Medications  Medication Dose Route Frequency Provider Last Rate Last Dose  . fentaNYL (SUBLIMAZE) 100 MCG/2ML injection           . gelatin adsorbable (GELFOAM/SURGIFOAM) 12-7 MM sponge 12-7 mm           . lidocaine (XYLOCAINE) 1 % (with pres) injection           . midazolam (VERSED) 2 MG/2ML injection           . 0.9 %  sodium chloride infusion   Intravenous Continuous Jonetta Osgood, MD 10 mL/hr at 04/30/16 2300    . acetaminophen (TYLENOL) suppository 650 mg  650 mg Rectal Once Elnora Morrison, MD   Stopped at 04/25/16 1955  . acetaminophen (TYLENOL) tablet 650 mg  650 mg Oral Q6H PRN Vianne Bulls, MD       Or  . acetaminophen (TYLENOL) suppository 650 mg  650 mg Rectal Q6H PRN Vianne Bulls, MD      . ceFAZolin (ANCEF) IVPB 2g/100 mL premix  2 g Intravenous Q8H Jonetta Osgood, MD   Stopped at 05/01/16 0915  . cyanocobalamin ((VITAMIN B-12)) injection 1,000 mcg  1,000 mcg Subcutaneous Daily Jonetta Osgood, MD   1,000 mcg at 05/01/16 0850  . [START ON 05/02/2016] enoxaparin (LOVENOX) injection 40 mg  40 mg Subcutaneous Q24H Jonetta Osgood, MD      .  ferrous sulfate tablet 325 mg  325 mg Oral BID WC Jonetta Osgood, MD   325 mg at 05/01/16 0849  . furosemide (LASIX) injection 40 mg  40 mg Intravenous BID Jonetta Osgood, MD   40 mg at 05/01/16 0845  . metoprolol (LOPRESSOR) injection 5 mg  5 mg Intravenous Q6H PRN Jonetta Osgood, MD      . metoprolol (LOPRESSOR) tablet 50 mg  50 mg Oral BID Jonetta Osgood, MD   50 mg at 05/01/16 0849  . multivitamin with minerals tablet 1 tablet  1 tablet Oral Daily Jonetta Osgood, MD   1 tablet at 05/01/16 0849  . oxyCODONE (Oxy IR/ROXICODONE) immediate release tablet 5-10 mg  5-10 mg Oral Q4H PRN Jonetta Osgood, MD   10 mg at 04/30/16 0758  . polyethylene glycol (MIRALAX / GLYCOLAX) packet 17 g  17 g Oral Daily Jonetta Osgood, MD   17 g at 04/30/16 1004  . potassium chloride SA (K-DUR,KLOR-CON) CR tablet 20  mEq  20 mEq Oral Daily Jonetta Osgood, MD   20 mEq at 05/01/16 0849  . promethazine (PHENERGAN) tablet 12.5 mg  12.5 mg Oral Q6H PRN Vianne Bulls, MD   12.5 mg at 04/27/16 0436  . protein supplement (PREMIER PROTEIN) liquid  11 oz Oral TID BM Jonetta Osgood, MD   11 oz at 04/28/16 2114  . sodium chloride flush (NS) 0.9 % injection 3 mL  3 mL Intravenous Q12H Vianne Bulls, MD   3 mL at 05/01/16 2774     Discharge Medications: Please see discharge summary for a list of discharge medications.  Relevant Imaging Results:  Relevant Lab Results:   Additional Information SS#: [id number]  Geralynn Ochs, LCSW

## 2016-05-01 NOTE — Progress Notes (Signed)
INFECTIOUS DISEASE PROGRESS NOTE  ID: Mirielle Byrum is a 61 y.o. female with  Principal Problem:   Sepsis ([place]) Active Problems:   Ovarian cancer ([place])   Malignant ascites   Microcytic anemia   Thrombocytosis ([facility])   Hyponatremia   Severe malnutrition ([facility])   Malnutrition of moderate degree  Subjective: c/o distension  Abtx:  Anti-infectives    Start     Dose/Rate Route Frequency Ordered Stop   04/28/16 0500  vancomycin (VANCOCIN) 1,500 mg in sodium chloride 0.9 % 500 mL IVPB  Status:  Discontinued     1,500 mg 250 mL/hr over 120 Minutes Intravenous Every 24 hours 04/27/16 0908 04/27/16 1544   04/27/16 1700  ceFAZolin (ANCEF) IVPB 1 g/50 mL premix  Status:  Discontinued     1 g 100 mL/hr over 30 Minutes Intravenous Every 8 hours 04/27/16 1544 04/27/16 1548   04/27/16 1700  ceFAZolin (ANCEF) IVPB 2g/100 mL premix     2 g 200 mL/hr over 30 Minutes Intravenous Every 8 hours 04/27/16 1548     04/26/16 1700  vancomycin (VANCOCIN) 1,250 mg in sodium chloride 0.9 % 250 mL IVPB  Status:  Discontinued     1,250 mg 166.7 mL/hr over 90 Minutes Intravenous Every 12 hours 04/26/16 0908 04/27/16 0908   04/26/16 0200  piperacillin-tazobactam (ZOSYN) IVPB 3.375 g  Status:  Discontinued     3.375 g 12.5 mL/hr over 240 Minutes Intravenous Every 8 hours 04/25/16 1827 04/27/16 1544   04/25/16 2300  vancomycin (VANCOCIN) IVPB 1000 mg/200 mL premix  Status:  Discontinued     1,000 mg 200 mL/hr over 60 Minutes Intravenous Every 8 hours 04/25/16 2247 04/26/16 0908   04/25/16 1745  piperacillin-tazobactam (ZOSYN) IVPB 3.375 g     3.375 g 100 mL/hr over 30 Minutes Intravenous  Once 04/25/16 1738 04/25/16 1854      Medications:  Scheduled: . acetaminophen  650 mg Rectal Once  . cyanocobalamin  1,000 mcg Subcutaneous Daily  . [START ON 05/02/2016] enoxaparin (LOVENOX) injection  40 mg Subcutaneous Q24H  . ferrous sulfate  325 mg Oral BID WC  . furosemide  40 mg Intravenous BID  . metoprolol  tartrate  50 mg Oral BID  . multivitamin with minerals  1 tablet Oral Daily  . polyethylene glycol  17 g Oral Daily  . potassium chloride  20 mEq Oral Daily  . protein supplement shake  11 oz Oral TID BM  . sodium chloride flush  3 mL Intravenous Q12H    Objective: Vital signs in last 24 hours: Temp:  [98.3 F (36.8 C)-98.9 F (37.2 C)] 98.5 F (36.9 C) (04/27 0735) Pulse Rate:  [71-87] 73 (04/27 0735) Resp:  [17-22] 21 (04/27 0735) BP: (120-141)/(43-77) 129/70 (04/27 0735) SpO2:  [97 %-100 %] 98 % (04/27 0735) Weight:  [91.5 kg (201 lb 11.2 oz)] 91.5 kg (201 lb 11.2 oz) (04/27 0823)   General appearance: alert, cooperative, fatigued and pale Resp: clear to auscultation bilaterally Cardio: regular rate and rhythm GI: normal findings: nontender and abnormal findings:  distended and hypoactive bowel sounds Extremities: edema 3+ BLE  Lab Results  Recent Labs  04/30/16 0247 05/01/16 0248  WBC 23.4* 23.1*  HGB 6.9* 8.2*  HCT 25.1* 27.4*  NA 131* 133*  K 4.1 3.5  CL 100* 100*  CO2 22 23  BUN 29* 26*  CREATININE 0.88 0.73   Liver Panel  Recent Labs  04/29/16 0219 05/01/16 0248  PROT 5.3* 5.4*  ALBUMIN 1.8*  1.9*  AST 13* 15  ALT 7* 6*  ALKPHOS 84 100  BILITOT 0.6 0.6   Sedimentation Rate No results for input(s): ESRSEDRATE in the last 72 hours. C-Reactive Protein No results for input(s): CRP in the last 72 hours.  Microbiology: Recent Results (from the past 240 hour(s))  Blood Culture (routine x 2)     Status: None   Collection Time: 04/25/16  5:54 PM  Result Value Ref Range Status   Specimen Description BLOOD RIGHT WRIST  Final   Special Requests IN PEDIATRIC BOTTLE Blood Culture adequate volume  Final   Culture NO GROWTH 5 DAYS  Final   Report Status 04/30/2016 FINAL  Final  Blood Culture (routine x 2)     Status: Abnormal   Collection Time: 04/25/16  5:58 PM  Result Value Ref Range Status   Specimen Description BLOOD RIGHT ANTECUBITAL  Final    Special Requests   Final    BOTTLES DRAWN AEROBIC AND ANAEROBIC Blood Culture adequate volume   Culture  Setup Time   Final    GRAM POSITIVE COCCI IN CLUSTERS AEROBIC BOTTLE ONLY Organism ID to follow CRITICAL RESULT CALLED TO, READ BACK BY AND VERIFIED WITH: AElta [place].D. 12:00 04/27/16 ([email or web address])    Culture STAPHYLOCOCCUS AUREUS (A)  Final   Report Status 04/29/2016 FINAL  Final   Organism ID, Bacteria STAPHYLOCOCCUS AUREUS  Final      Susceptibility   Staphylococcus aureus - MIC*    CIPROFLOXACIN <=0.5 SENSITIVE Sensitive     ERYTHROMYCIN <=0.25 SENSITIVE Sensitive     GENTAMICIN <=0.5 SENSITIVE Sensitive     OXACILLIN <=0.25 SENSITIVE Sensitive     TETRACYCLINE <=1 SENSITIVE Sensitive     VANCOMYCIN 1 SENSITIVE Sensitive     TRIMETH/SULFA <=10 SENSITIVE Sensitive     CLINDAMYCIN <=0.25 SENSITIVE Sensitive     RIFAMPIN <=0.5 SENSITIVE Sensitive     Inducible Clindamycin NEGATIVE Sensitive     * STAPHYLOCOCCUS AUREUS  Blood Culture ID Panel (Reflexed)     Status: Abnormal   Collection Time: 04/25/16  5:58 PM  Result Value Ref Range Status   Enterococcus species NOT DETECTED NOT DETECTED Final   Listeria monocytogenes NOT DETECTED NOT DETECTED Final   Staphylococcus species DETECTED (A) NOT DETECTED Final    Comment: CRITICAL RESULT CALLED TO, READ BACK BY AND VERIFIED WITH: AElta [place].D. 12:00 04/27/16 ([email or web address])    Staphylococcus aureus DETECTED (A) NOT DETECTED Final    Comment: Methicillin (oxacillin) susceptible Staphylococcus aureus (MSSA). Preferred therapy is anti staphylococcal beta lactam antibiotic (Cefazolin or Nafcillin), unless clinically contraindicated. CRITICAL RESULT CALLED TO, READ BACK BY AND VERIFIED WITH: AElta [place].D. 12:00 04/27/16 (wilsonm)    Methicillin resistance NOT DETECTED NOT DETECTED Final   Streptococcus species NOT DETECTED NOT DETECTED Final   Streptococcus agalactiae NOT DETECTED NOT DETECTED Final   Streptococcus  pneumoniae NOT DETECTED NOT DETECTED Final   Streptococcus pyogenes NOT DETECTED NOT DETECTED Final   Acinetobacter baumannii NOT DETECTED NOT DETECTED Final   Enterobacteriaceae species NOT DETECTED NOT DETECTED Final   Enterobacter cloacae complex NOT DETECTED NOT DETECTED Final   Escherichia coli NOT DETECTED NOT DETECTED Final   Klebsiella oxytoca NOT DETECTED NOT DETECTED Final   Klebsiella pneumoniae NOT DETECTED NOT DETECTED Final   Proteus species NOT DETECTED NOT DETECTED Final   Serratia marcescens NOT DETECTED NOT DETECTED Final   Haemophilus influenzae NOT DETECTED NOT DETECTED Final   Neisseria meningitidis NOT DETECTED NOT DETECTED Final   Pseudomonas  aeruginosa NOT DETECTED NOT DETECTED Final   Candida albicans NOT DETECTED NOT DETECTED Final   Candida glabrata NOT DETECTED NOT DETECTED Final   Candida krusei NOT DETECTED NOT DETECTED Final   Candida parapsilosis NOT DETECTED NOT DETECTED Final   Candida tropicalis NOT DETECTED NOT DETECTED Final  MRSA PCR Screening     Status: None   Collection Time: 04/25/16 10:25 PM  Result Value Ref Range Status   MRSA by PCR NEGATIVE NEGATIVE Final    Comment:        The GeneXpert MRSA Assay (FDA approved for NASAL specimens only), is one component of a comprehensive MRSA colonization surveillance program. It is not intended to diagnose MRSA infection nor to guide or monitor treatment for MRSA infections.   Urine culture     Status: None   Collection Time: 04/26/16  5:30 AM  Result Value Ref Range Status   Specimen Description URINE, RANDOM  Final   Special Requests NONE  Final   Culture NO GROWTH  Final   Report Status 04/27/2016 FINAL  Final  Culture, body fluid-bottle     Status: None (Preliminary result)   Collection Time: 04/26/16  1:36 PM  Result Value Ref Range Status   Specimen Description FLUID  Final   Special Requests PERITONEAL  Final   Gram Stain   Final    GRAM POSITIVE COCCI IN CLUSTERS ANAEROBIC  BOTTLE ONLY CRITICAL RESULT CALLED TO, READ BACK BY AND VERIFIED WITH: CCherylann Parr RN, AT 1347 04/30/16 BY D. VANHOOK    Culture GRAM POSITIVE COCCI  Final   Report Status PENDING  Incomplete  Gram stain     Status: None   Collection Time: 04/26/16  1:36 PM  Result Value Ref Range Status   Specimen Description FLUID  Final   Special Requests PERITONEAL  Final   Gram Stain   Final    WBC PRESENT, PREDOMINANTLY PMN GRAM POSITIVE COCCI IN CLUSTERS IN PAIRS    Report Status 04/26/2016 FINAL  Final  Culture, blood (Routine X 2) w Reflex to ID Panel     Status: None (Preliminary result)   Collection Time: 04/28/16  4:14 AM  Result Value Ref Range Status   Specimen Description BLOOD LEFT ARM  Final   Special Requests AEROBIC BOTTLE ONLY Blood Culture adequate volume  Final   Culture NO GROWTH 2 DAYS  Final   Report Status PENDING  Incomplete  Culture, body fluid-bottle     Status: None (Preliminary result)   Collection Time: 04/28/16 11:40 AM  Result Value Ref Range Status   Specimen Description FLUID PERITONEAL  Final   Special Requests NONE  Final   Culture NO GROWTH 2 DAYS  Final   Report Status PENDING  Incomplete  Gram stain     Status: None   Collection Time: 04/28/16 11:40 AM  Result Value Ref Range Status   Specimen Description FLUID PERITONEAL  Final   Special Requests NONE  Final   Gram Stain   Final    ABUNDANT WBC PRESENT,BOTH PMN AND MONONUCLEAR NO ORGANISMS SEEN    Report Status 04/29/2016 FINAL  Final    Studies/Results: No results found.   Assessment/Plan: MSSA bacteremia, peritonitis Repeat BCx sent 4-24, so far ngtd TTE ED 60-65% Continue ancef Wbc remains elevated Will ask for IR to send routine/fungal/afb Cx. Entered, d/I IR  Pelvic Mass Cytology- no malignant cells (4-21 and 4-24) Tumor markers - CA 125- 227, CEA nl, CA-19-1 nl For IR Bx  Protein Calorie Malnutrition  Anemia HCT/Hgb down         Bobby Rumpf Infectious Diseases (pager)  [phone] [email or web address] 05/01/2016, 9:49 AM  LOS: 6 days

## 2016-05-01 NOTE — Procedures (Signed)
Omental mass  s/p CT anterior omental mass bx  No comp Stable Path pending Full report in PACS

## 2016-05-01 NOTE — Progress Notes (Signed)
Patient back from CT with biopsy performed alert and oriented x3.   placed over mid distended abdomen above umbilicus.

## 2016-05-02 ENCOUNTER — Inpatient Hospital Stay (HOSPITAL_COMMUNITY): Payer: Medicaid Other

## 2016-05-02 DIAGNOSIS — K7011 Alcoholic hepatitis with ascites: Secondary | ICD-10-CM

## 2016-05-02 DIAGNOSIS — R609 Edema, unspecified: Secondary | ICD-10-CM

## 2016-05-02 LAB — CBC WITH DIFFERENTIAL/PLATELET
BASOS PCT: 0 %
Basophils Absolute: 0 10*3/uL (ref 0.0–0.1)
EOS ABS: 0.2 10*3/uL (ref 0.0–0.7)
EOS PCT: 1 %
HCT: 28.3 % — ABNORMAL LOW (ref 36.0–46.0)
HEMOGLOBIN: 7.9 g/dL — AB (ref 12.0–15.0)
LYMPHS ABS: 1.1 10*3/uL (ref 0.7–4.0)
Lymphocytes Relative: 5 %
MCH: 19.2 pg — AB (ref 26.0–34.0)
MCHC: 27.9 g/dL — AB (ref 30.0–36.0)
MCV: 68.9 fL — ABNORMAL LOW (ref 78.0–100.0)
MONOS PCT: 4 %
Monocytes Absolute: 0.9 10*3/uL (ref 0.1–1.0)
NEUTROS PCT: 90 %
Neutro Abs: 19.4 10*3/uL — ABNORMAL HIGH (ref 1.7–7.7)
Platelets: 375 10*3/uL (ref 150–400)
RBC: 4.11 MIL/uL (ref 3.87–5.11)
RDW: 23.7 % — AB (ref 11.5–15.5)
WBC: 21.6 10*3/uL — AB (ref 4.0–10.5)

## 2016-05-02 LAB — COMPREHENSIVE METABOLIC PANEL
ALK PHOS: 84 U/L (ref 38–126)
ALT: 7 U/L — ABNORMAL LOW (ref 14–54)
ANION GAP: 6 (ref 5–15)
AST: 26 U/L (ref 15–41)
Albumin: 1.8 g/dL — ABNORMAL LOW (ref 3.5–5.0)
BUN: 25 mg/dL — ABNORMAL HIGH (ref 6–20)
CALCIUM: 7.9 mg/dL — AB (ref 8.9–10.3)
CO2: 27 mmol/L (ref 22–32)
Chloride: 102 mmol/L (ref 101–111)
Creatinine, Ser: 0.68 mg/dL (ref 0.44–1.00)
GFR calc non Af Amer: >60 mL/min (ref >60)
Glucose, Bld: 116 mg/dL — ABNORMAL HIGH (ref 65–99)
POTASSIUM: 3.7 mmol/L (ref 3.5–5.1)
SODIUM: 135 mmol/L (ref 135–145)
Total Bilirubin: 0.2 mg/dL — ABNORMAL LOW (ref 0.3–1.2)
Total Protein: 5.3 g/dL — ABNORMAL LOW (ref 6.5–8.1)

## 2016-05-02 LAB — CULTURE, BODY FLUID W GRAM STAIN -BOTTLE

## 2016-05-02 LAB — CULTURE, BODY FLUID-BOTTLE

## 2016-05-02 LAB — GLUCOSE, CAPILLARY
GLUCOSE-CAPILLARY: 145 mg/dL — AB (ref 65–99)
Glucose-Capillary: 103 mg/dL — ABNORMAL HIGH (ref 65–99)

## 2016-05-02 MED ORDER — ENOXAPARIN SODIUM 150 MG/ML ~~LOC~~ SOLN
1.5000 mg/kg | SUBCUTANEOUS | Status: DC
  Administered 2016-05-03 – 2016-05-04 (×2): 140 mg via SUBCUTANEOUS
  Filled 2016-05-02 (×3): qty 0.93

## 2016-05-02 MED ORDER — SALINE SPRAY 0.65 % NA SOLN
1.0000 | Freq: Four times a day (QID) | NASAL | Status: DC
  Administered 2016-05-02 – 2016-05-06 (×8): 1 via NASAL
  Filled 2016-05-02 (×2): qty 44

## 2016-05-02 MED ORDER — ENOXAPARIN SODIUM 100 MG/ML ~~LOC~~ SOLN
100.0000 mg | Freq: Once | SUBCUTANEOUS | Status: AC
  Administered 2016-05-02: 100 mg via SUBCUTANEOUS
  Filled 2016-05-02: qty 1

## 2016-05-02 MED ORDER — SPIRONOLACTONE 25 MG PO TABS
25.0000 mg | ORAL_TABLET | Freq: Every day | ORAL | Status: DC
  Administered 2016-05-02 – 2016-05-03 (×2): 25 mg via ORAL
  Filled 2016-05-02 (×2): qty 1

## 2016-05-02 MED ORDER — PANTOPRAZOLE SODIUM 40 MG PO TBEC
40.0000 mg | DELAYED_RELEASE_TABLET | Freq: Every day | ORAL | Status: DC
  Administered 2016-05-02 – 2016-05-06 (×5): 40 mg via ORAL
  Filled 2016-05-02 (×5): qty 1

## 2016-05-02 MED ORDER — VITAMIN B-12 1000 MCG PO TABS
1000.0000 ug | ORAL_TABLET | Freq: Every day | ORAL | Status: DC
  Administered 2016-05-03 – 2016-05-06 (×4): 1000 ug via ORAL
  Filled 2016-05-02 (×4): qty 1

## 2016-05-02 NOTE — Progress Notes (Signed)
ANTICOAGULATION CONSULT NOTE - Initial Consult  Pharmacy Consult for Lovenox Indication: superficial thrombosis in the left small saphenous vein  Allergies  Allergen Reactions  . Pseudoephedrine Other (See Comments)    "makes me loopy"    Patient Measurements: Height: 5\' 5"  (165.1 cm) Weight: 205 lb 4 oz (93.1 kg) IBW/kg (Calculated) : 57  Vital Signs: Temp: 97.6 F (36.4 C) (04/28 0812) Temp Source: Oral (04/28 0812) BP: 133/72 (04/28 0812) Pulse Rate: 75 (04/28 0812)  Labs:  Recent Labs  04/30/16 0247 05/01/16 0248 05/01/16 0958 05/02/16 0427  HGB 6.9* 8.2*  --  7.9*  HCT 25.1* 27.4*  --  28.3*  PLT 365 364  --  375  LABPROT  --   --  14.6  --   INR  --   --  1.14  --   CREATININE 0.88 0.73  --  0.68    Estimated Creatinine Clearance: 84.3 mL/min (by C-G formula based on SCr of 0.68 mg/dL).   Medical History: Past Medical History:  Diagnosis Date  . Ovarian cancer ([place]) 04/25/2016    Assessment: 61 y/o female with MSSA peritonitis and bacteremia on cefazolin therapy. Also with possible ovarian cancer vs infectious process, biopsy results pending. Pharmacy consulted to dose Lovenox for superficial thrombosis in the left small saphenous vein by BLE duplex. No bleeding noted, Hgb is low but stable in 7's, platelets are normal. Lovenox 40 mg SQ given this morning at 08:55. Given possibility of cancer and ease of administration upon discharge, will begin once daily dosing.  Goal of Therapy:  Anti-Xa level 1-2 units/ml 4 hrs after dose given Monitor platelets by anticoagulation protocol: Yes   Plan:  - Lovenox 100 mg SQ once for total 140 mg today - Lovenox 140 mg SQ q24h (1.5 mg/kg/d) starting 4/29 - CBC q72h while on Lovenox - Monitor for s/sx of bleeding   Renold Genta, PharmD, BCPS Clinical Pharmacist Phone for today - [place] - [phone] 05/02/2016 11:16 AM

## 2016-05-02 NOTE — Progress Notes (Signed)
Orthopedic Tech Progress Note Patient Details:  Allison Haynes [birth year] [id number]  Ortho Devices Type of Ortho Device: Louretta Parma boot Ortho Device/Splint Location: bilateral Ortho Device/Splint Interventions: Application   Hildred Priest 05/02/2016, 12:38 PM

## 2016-05-02 NOTE — Progress Notes (Signed)
Pt arrived to the unit at the unit via wheelchair at 1525.

## 2016-05-02 NOTE — Progress Notes (Signed)
PROGRESS NOTE        PATIENT DETAILS Name: Allison Haynes Age: 61 y.o. Sex: female Date of Birth: [birth year] Admit Date: 04/25/2016 Admitting Physician Vianne Bulls, MD PCP:No primary care provider on file.  Brief Narrative: Patient is a 61 y.o. female with no significant medical history-presented to the ED on 4/21 with worsening abdominal pain,distention and fever. CT scan of the abdomen showed a large complex cystic/solid mass emanating from one of both of the adnexal regions with peritoneal/omental caking and potential malignant ascites. She underwent paracentesis 2, with negative cytology. Her initial paracentesis on 4/22, showed gram-positive cocci, but cultures were negative. Blood cultures done on admission was positive for MSSA. 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 Thankfully, with empiric antimicrobial therapy-sepsis pathophysiology has resolved, and she is slowly improving. Please note, hospital course has been complicated by a brief episode of A. fib RVR, multifactorial anemia requiring PRBC 3, and volume overload. See below for further details.  Note: Patient has not seen a physician or sought routine medical in the outpatient setting for approximately 25 years!  Subjective:  Patient sitting up in the chair, denies any headache, no fever or chills, no chest pain, no shortness of breath, abdomen feels distended but not painful. No focal weakness.   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 Blood cultures 1 out of 2 drawn on 04/25/2016 positive for MSSA. Echocardiogram does not show any signs of vegetations.  Due to uncertainty of her omental mass and peritoneal fluid collection she underwent omental biopsy by IR on 05/01/2016. Note biopsy sample has also been sent for various cultures. Follow biopsy culture results and cytology.   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 Unfortunately, cytology negative 2-even though CA 125 elevated, she underwent omental biopsy on 05/01/2016 follow cytology.  Paroxysmal atrial fibrillation [place] vasc 2 score off 1. Currently stable on oral beta blocker, echo shows preserved EF with grade 2 chronic diastolic CHF.  Significant volume overload with lower extremity edema: The combination of adnexal mass and omental metastases along with possible acute on chronic diastolic CHF. Continue diuresis, unfortunately venous ultrasound shows right superficial SVT in the lower extremity. Apply Unna boots, anticoagulate and monitor.  Left small saphenous vein SVT. Discussed with vascular surgeon Dr. Scot Dock on 05/02/2016, due to high suspicion for underlying malignancy for now start her on Lovenox and monitor.   Anemia of chronic disease: Suspect mostly has anemia of chronic disease with elements of vitamin B12 deficiency and some iron deficiency (soluble transferrin receptor assay slightly increased). Anemia has worsened due to critical illness as well. She has required 3 units of PRBC so far, she has been started on vitamin B12 and iron supplementation. Trend hemoglobin periodically. No signs of ongoing GI blood loss, and oral PPI.  Minimally elevatedTroponin level: Likely secondary to  demand ischemia-doubt ACS. Given issues with malignancy.  severity of anemia-doubt further workup needed at this time. Echo showed preserved EF without any wall motion abnormalities  Left arm ecchymosis: Occurred when IV infiltrated when she was receiving of PRBC transfusion. This appears stable-ecchymosis mostly appears to be very superficial.  Thrombocytosis: Likely reactive-due to underlying malignancy/chronic inflammation.  Moderate protein calorie malnutrition: Seen by nutrition, continue supplements.  Scalp mass: Per patient is has been there for the past 12 years-see pictures below. I do not think this is related to her presentation. She will likely require a biopsy-at some point-probably as an outpatient. See pictures below.   DVT Prophylaxis: Prophylactic Lovenox   Code Status: Full code   Family Communication: None at bedside   Disposition Plan:  Tele  Antimicrobial agents: Anti-infectives    Start     Dose/Rate Route Frequency Ordered Stop   04/28/16 0500  vancomycin (VANCOCIN) 1,500 mg in sodium chloride 0.9 % 500 mL IVPB  Status:  Discontinued     1,500 mg 250 mL/hr over 120 Minutes Intravenous Every 24 hours 04/27/16 0908 04/27/16 1544   04/27/16 1700  ceFAZolin (ANCEF) IVPB 1 g/50 mL premix  Status:  Discontinued     1 g 100 mL/hr over 30 Minutes Intravenous Every 8 hours 04/27/16 1544 04/27/16 1548   04/27/16 1700  ceFAZolin (ANCEF) IVPB 2g/100 mL premix     2 g 200 mL/hr over 30 Minutes Intravenous Every 8 hours 04/27/16 1548     04/26/16 1700  vancomycin (VANCOCIN) 1,250 mg in sodium chloride 0.9 % 250 mL IVPB  Status:  Discontinued     1,250 mg 166.7 mL/hr over 90 Minutes Intravenous Every 12 hours 04/26/16 0908 04/27/16 0908   04/26/16 0200  piperacillin-tazobactam (ZOSYN) IVPB 3.375 g  Status:  Discontinued     3.375 g 12.5 mL/hr over 240 Minutes Intravenous Every 8 hours 04/25/16 1827 04/27/16 1544   04/25/16 2300  vancomycin (VANCOCIN) IVPB 1000 mg/200 mL premix  Status:  Discontinued     1,000 mg 200 mL/hr  over 60 Minutes Intravenous Every 8 hours 04/25/16 2247 04/26/16 0908   04/25/16 1745  piperacillin-tazobactam (ZOSYN) IVPB 3.375 g     3.375 g 100 mL/hr over 30 Minutes Intravenous  Once 04/25/16 1738 04/25/16 1854      Procedures:  Bilateral lower extremity venous duplex completed.  Preliminary report:  There is no DVT noted in the bilateral lower extremities.  There is superficial thrombosis noted in the left small saphenous vein.  There is a small Baker's cyst noted in the right popliteal fossa.   US paracentesis - Successful ultrasound-guided diagnostic and therapeutic paracentesis yielding 530 liters of peritoneal fluid.  CT - omental biopsy 05-02-16 -    CONSULTS:  GYN Oncology  Time spent: 30-minutes-Greater than 50% of this time was spent in counseling, explanation of diagnosis, planning of further management, and coordination of care.  MEDICATIONS: Scheduled Meds: . cyanocobalamin  1,000 mcg Subcutaneous Daily  . enoxaparin (LOVENOX) injection  40 mg Subcutaneous Q24H  . ferrous sulfate  325 mg Oral BID WC  . furosemide  40 mg Intravenous BID  . metoprolol tartrate  50 mg Oral BID  . multivitamin with minerals  1 tablet Oral Daily  . polyethylene glycol  17 g Oral Daily  . potassium chloride  20 mEq Oral Daily  . protein supplement shake  11 oz Oral TID BM  . sodium chloride flush  3 mL Intravenous Q12H  . spironolactone  25 mg Oral Daily   Continuous Infusions: .  ceFAZolin (ANCEF) IV Stopped (05/02/16 0935)   PRN Meds:.acetaminophen **OR** [DISCONTINUED] acetaminophen, metoprolol, oxyCODONE, promethazine   PHYSICAL EXAM: Vital signs: Vitals:   05/01/16 2337 05/02/16 0313 05/02/16 0500 05/02/16 0812  BP: 117/85 117/61  133/72  Pulse: 74 66  75  Resp: (!) 24 (!) 22  (!) 22  Temp: 99 F (37.2 C) 97.6 F (36.4 C)  97.6 F (36.4 C)  TempSrc: Oral Oral  Oral  SpO2: 95% 98%  98%  Weight:   93.1 kg (205 lb 4 oz)   Height:       Filed Weights   04/29/16  0500 05/01/16 0823 05/02/16 0500  Weight: 104.4 kg (230 lb 2.6 oz) 91.5 kg (201 lb 11.2 oz) 93.1 kg (205 lb 4 oz)   Body mass index is 34.16 kg/m.   Awake Alert, Oriented X 3, No new F.N deficits, Normal affect [place].AT,PERRAL Supple Neck,No JVD, No cervical lymphadenopathy appriciated.  Symmetrical Chest wall movement, Good air movement bilaterally, CTAB RRR,No Gallops,Rubs or new Murmurs, No Parasternal Heave +ve B.Sounds, Abd is taught and distended, No tenderness, No organomegaly appriciated, No rebound - guarding or rigidity. No Cyanosis, Clubbing , 2+ leg edema, No new Rash or bruise    Note picture taken on 4/27 after patient's permission.     I have personally reviewed following labs and imaging studies  LABORATORY DATA: CBC:  Recent Labs Lab 04/25/16 1745  04/26/16 0330 04/27/16 0309 04/28/16 0414 04/29/16 0219 04/29/16 1837 04/30/16 0247 05/01/16 0248 05/02/16 0427  WBC 19.4*  --  25.2* 33.8* 27.1* 23.5* 26.1* 23.4* 23.1* 21.6*  NEUTROABS 17.6*  --  23.4* 32.1* 26.0*  --   --   --   --  19.4*  HGB 8.2*  < > 7.6* 7.1* 7.1* 7.0* 7.6* 6.9* 8.2* 7.9*  HCT 30.1*  < > 28.6* 26.1* 24.8* 25.4* 27.6* 25.1* 27.4* 28.3*  MCV 65.7*  --  65.7* 66.4* 66.5* 66.1* 66.0* 66.1* 67.7* 68.9*  PLT 616*  --  457* 427* 366 363 396 365 364 375  < > = values in this interval not displayed.  Basic Metabolic Panel:  Recent Labs Lab 04/28/16 0414 04/29/16 0219 04/30/16 0247 05/01/16 0248 05/02/16 0427  NA 133* 130* 131* 133* 135  K 4.4 4.5 4.1 3.5 3.7  CL 101 101 100* 100* 102  CO2 22 20* 22 23 27   GLUCOSE 127* 117* 127* 107* 116*  BUN 30* 31* 29* 26* 25*  CREATININE 0.96 0.83 0.88 0.73 0.68  CALCIUM 8.0* 7.7* 7.8* 7.9* 7.9*    GFR: Estimated Creatinine Clearance: 84.3 mL/min (by C-G formula based on SCr of 0.68 mg/dL).  Liver Function Tests:  Recent Labs Lab 04/27/16 0309 04/28/16 0414 04/29/16 0219 05/01/16 0248 05/02/16 0427  AST 19 14* 13* 15 26  ALT 9* 10*  7* 6* 7*  ALKPHOS 63 78 84 100 84  BILITOT 1.3* 1.0 0.6 0.6 0.2*  PROT 5.1* 5.2* 5.3* 5.4* 5.3*  ALBUMIN 2.1* 1.9* 1.8* 1.9* 1.8*   No results for input(s): LIPASE, AMYLASE in the last 168 hours. No results for input(s): AMMONIA in the last 168 hours.  Coagulation Profile:  Recent Labs Lab 04/25/16 1920 05/01/16 0958  INR 1.38 1.14    Cardiac Enzymes:  Recent Labs Lab 04/27/16 0309  TROPONINI 0.11*    BNP (last 3 results) No results for input(s): PROBNP in the last 8760 hours.  HbA1C: No results for input(s): HGBA1C  in the last 72 hours.  CBG:  Recent Labs Lab 04/28/16 0844 04/29/16 0805 04/30/16 0802 05/01/16 0733 05/02/16 0810  GLUCAP 118* 111* 114* 111* 103*    Lipid Profile: No results for input(s): CHOL, HDL, LDLCALC, TRIG, CHOLHDL, LDLDIRECT in the last 72 hours.  Thyroid Function Tests: No results for input(s): TSH, T4TOTAL, FREET4, T3FREE, THYROIDAB in the last 72 hours.  Anemia Panel: No results for input(s): VITAMINB12, FOLATE, FERRITIN, TIBC, IRON, RETICCTPCT in the last 72 hours.  Urine analysis:    Component Value Date/Time   COLORURINE AMBER (A) 04/26/2016 0530   APPEARANCEUR CLEAR 04/26/2016 0530   LABSPEC >1.046 (H) 04/26/2016 0530   PHURINE 5.0 04/26/2016 0530   GLUCOSEU NEGATIVE 04/26/2016 0530   HGBUR NEGATIVE 04/26/2016 0530   BILIRUBINUR NEGATIVE 04/26/2016 0530   KETONESUR NEGATIVE 04/26/2016 0530   PROTEINUR NEGATIVE 04/26/2016 0530   NITRITE NEGATIVE 04/26/2016 0530   LEUKOCYTESUR NEGATIVE 04/26/2016 0530    Sepsis Labs: Lactic Acid, Venous    Component Value Date/Time   LATICACIDVEN 1.4 04/27/2016 0309    MICROBIOLOGY: Recent Results (from the past 240 hour(s))  Blood Culture (routine x 2)     Status: None   Collection Time: 04/25/16  5:54 PM  Result Value Ref Range Status   Specimen Description BLOOD RIGHT WRIST  Final   Special Requests IN PEDIATRIC BOTTLE Blood Culture adequate volume  Final   Culture NO  GROWTH 5 DAYS  Final   Report Status 04/30/2016 FINAL  Final  Blood Culture (routine x 2)     Status: Abnormal   Collection Time: 04/25/16  5:58 PM  Result Value Ref Range Status   Specimen Description BLOOD RIGHT ANTECUBITAL  Final   Special Requests   Final    BOTTLES DRAWN AEROBIC AND ANAEROBIC Blood Culture adequate volume   Culture  Setup Time   Final    GRAM POSITIVE COCCI IN CLUSTERS AEROBIC BOTTLE ONLY Organism ID to follow CRITICAL RESULT CALLED TO, READ BACK BY AND VERIFIED WITH: AElta [place].D. 12:00 04/27/16 ([email or web address])    Culture STAPHYLOCOCCUS AUREUS (A)  Final   Report Status 04/29/2016 FINAL  Final   Organism ID, Bacteria STAPHYLOCOCCUS AUREUS  Final      Susceptibility   Staphylococcus aureus - MIC*    CIPROFLOXACIN <=0.5 SENSITIVE Sensitive     ERYTHROMYCIN <=0.25 SENSITIVE Sensitive     GENTAMICIN <=0.5 SENSITIVE Sensitive     OXACILLIN <=0.25 SENSITIVE Sensitive     TETRACYCLINE <=1 SENSITIVE Sensitive     VANCOMYCIN 1 SENSITIVE Sensitive     TRIMETH/SULFA <=10 SENSITIVE Sensitive     CLINDAMYCIN <=0.25 SENSITIVE Sensitive     RIFAMPIN <=0.5 SENSITIVE Sensitive     Inducible Clindamycin NEGATIVE Sensitive     * STAPHYLOCOCCUS AUREUS  Blood Culture ID Panel (Reflexed)     Status: Abnormal   Collection Time: 04/25/16  5:58 PM  Result Value Ref Range Status   Enterococcus species NOT DETECTED NOT DETECTED Final   Listeria monocytogenes NOT DETECTED NOT DETECTED Final   Staphylococcus species DETECTED (A) NOT DETECTED Final    Comment: CRITICAL RESULT CALLED TO, READ BACK BY AND VERIFIED WITH: AElta [place].D. 12:00 04/27/16 ([email or web address])    Staphylococcus aureus DETECTED (A) NOT DETECTED Final    Comment: Methicillin (oxacillin) susceptible Staphylococcus aureus (MSSA). Preferred therapy is anti staphylococcal beta lactam antibiotic (Cefazolin or Nafcillin), unless clinically contraindicated. CRITICAL RESULT CALLED TO, READ BACK BY AND VERIFIED WITH: AElta [place].D. 12:00 04/27/16 ([email or web address])  Methicillin resistance NOT DETECTED NOT DETECTED Final   Streptococcus species NOT DETECTED NOT DETECTED Final   Streptococcus agalactiae NOT DETECTED NOT DETECTED Final   Streptococcus pneumoniae NOT DETECTED NOT DETECTED Final   Streptococcus pyogenes NOT DETECTED NOT DETECTED Final   Acinetobacter baumannii NOT DETECTED NOT DETECTED Final   Enterobacteriaceae species NOT DETECTED NOT DETECTED Final   Enterobacter cloacae complex NOT DETECTED NOT DETECTED Final   Escherichia coli NOT DETECTED NOT DETECTED Final   Klebsiella oxytoca NOT DETECTED NOT DETECTED Final   Klebsiella pneumoniae NOT DETECTED NOT DETECTED Final   Proteus species NOT DETECTED NOT DETECTED Final   Serratia marcescens NOT DETECTED NOT DETECTED Final   Haemophilus influenzae NOT DETECTED NOT DETECTED Final   Neisseria meningitidis NOT DETECTED NOT DETECTED Final   Pseudomonas aeruginosa NOT DETECTED NOT DETECTED Final   Candida albicans NOT DETECTED NOT DETECTED Final   Candida glabrata NOT DETECTED NOT DETECTED Final   Candida krusei NOT DETECTED NOT DETECTED Final   Candida parapsilosis NOT DETECTED NOT DETECTED Final   Candida tropicalis NOT DETECTED NOT DETECTED Final  MRSA PCR Screening     Status: None   Collection Time: 04/25/16 10:25 PM  Result Value Ref Range Status   MRSA by PCR NEGATIVE NEGATIVE Final    Comment:        The GeneXpert MRSA Assay (FDA approved for NASAL specimens only), is one component of a comprehensive MRSA colonization surveillance program. It is not intended to diagnose MRSA infection nor to guide or monitor treatment for MRSA infections.   Urine culture     Status: None   Collection Time: 04/26/16  5:30 AM  Result Value Ref Range Status   Specimen Description URINE, RANDOM  Final   Special Requests NONE  Final   Culture NO GROWTH  Final   Report Status 04/27/2016 FINAL  Final  Culture, body fluid-bottle     Status: Abnormal     Collection Time: 04/26/16  1:36 PM  Result Value Ref Range Status   Specimen Description FLUID  Final   Special Requests PERITONEAL  Final   Gram Stain   Final    GRAM POSITIVE COCCI IN CLUSTERS ANAEROBIC BOTTLE ONLY CRITICAL RESULT CALLED TO, READ BACK BY AND VERIFIED WITH: C. STATEN RN, AT 1347 04/30/16 BY D. VANHOOK    Culture STAPHYLOCOCCUS AUREUS (A)  Final   Report Status 05/02/2016 FINAL  Final   Organism ID, Bacteria STAPHYLOCOCCUS AUREUS  Final      Susceptibility   Staphylococcus aureus - MIC*    CIPROFLOXACIN <=0.5 SENSITIVE Sensitive     ERYTHROMYCIN <=0.25 SENSITIVE Sensitive     GENTAMICIN <=0.5 SENSITIVE Sensitive     OXACILLIN <=0.25 SENSITIVE Sensitive     TETRACYCLINE <=1 SENSITIVE Sensitive     VANCOMYCIN 1 SENSITIVE Sensitive     TRIMETH/SULFA <=10 SENSITIVE Sensitive     CLINDAMYCIN <=0.25 SENSITIVE Sensitive     RIFAMPIN <=0.5 SENSITIVE Sensitive     Inducible Clindamycin NEGATIVE Sensitive     * STAPHYLOCOCCUS AUREUS  Gram stain     Status: None   Collection Time: 04/26/16  1:36 PM  Result Value Ref Range Status   Specimen Description FLUID  Final   Special Requests PERITONEAL  Final   Gram Stain   Final    WBC PRESENT, PREDOMINANTLY PMN GRAM POSITIVE COCCI IN CLUSTERS IN PAIRS    Report Status 04/26/2016 FINAL  Final  Culture, blood (Routine X 2) w Reflex to ID Panel  Status: None (Preliminary result)   Collection Time: 04/28/16  4:14 AM  Result Value Ref Range Status   Specimen Description BLOOD LEFT ARM  Final   Special Requests AEROBIC BOTTLE ONLY Blood Culture adequate volume  Final   Culture NO GROWTH 3 DAYS  Final   Report Status PENDING  Incomplete  Culture, body fluid-bottle     Status: None (Preliminary result)   Collection Time: 04/28/16 11:40 AM  Result Value Ref Range Status   Specimen Description FLUID PERITONEAL  Final   Special Requests NONE  Final   Culture NO GROWTH 3 DAYS  Final   Report Status PENDING  Incomplete  Gram  stain     Status: None   Collection Time: 04/28/16 11:40 AM  Result Value Ref Range Status   Specimen Description FLUID PERITONEAL  Final   Special Requests NONE  Final   Gram Stain   Final    ABUNDANT WBC PRESENT,BOTH PMN AND MONONUCLEAR NO ORGANISMS SEEN    Report Status 04/29/2016 FINAL  Final    RADIOLOGY STUDIES/RESULTS: Ct Angio Chest Pe W Or Wo Contrast  Result Date: 04/27/2016 CLINICAL DATA:  61 year old female with history of ovarian cancer presenting with tachycardia. EXAM: CT ANGIOGRAPHY CHEST WITH CONTRAST TECHNIQUE: Multidetector CT imaging of the chest was performed using the standard protocol during bolus administration of intravenous contrast. Multiplanar CT image reconstructions and MIPs were obtained to evaluate the vascular anatomy. CONTRAST:  100 cc Isovue 370 COMPARISON:  Chest radiograph dated 04/25/2016 and CT of the abdomen pelvis dated 04/25/2016 FINDINGS: Cardiovascular: There is mild cardiomegaly. No pericardial effusion. There is coronary vascular calcification involving the LAD. The thoracic aorta appears unremarkable. The origins of the great vessels of the aortic arch appear patent. There is dilatation of the main pulmonary trunk suggestive of underlying pulmonary hypertension. Evaluation of the pulmonary arteries is limited due to suboptimal opacification of the peripheral branches. No central pulmonary artery embolus identified. Mediastinum/Nodes: There is no hilar or mediastinal adenopathy. The esophagus and the thyroid gland are grossly unremarkable. Lungs/Pleura: There is a small left pleural effusion. There are consolidative changes of the left lower lobe as well as scattered densities in the left upper lobe. Overall there is volume loss in the left lung with shift of the heart into the left hemithorax. There is segmental air trapping in the right middle lobe with diffuse hazy airspace density in the right upper and right lower lobes. Paramediastinal subpleural  density in the right upper lobe likely represents atelectatic changes or pleural thickening. There is no pneumothorax. Upper Abdomen: Ascites is partially visualized. Musculoskeletal: Multilevel degenerative changes of the spine. No acute osseous pathology. Review of the MIP images confirms the above findings. IMPRESSION: 1. No CT evidence of pulmonary artery embolus. 2. Areas of consolidative change involving the left lower lobe as well as scattered pulmonary densities in the left upper lobe. Findings may represent pneumonia. Correlation with clinical exam recommended. 3. Overall decreased left lung volume with leftward shift of the mediastinum and heart. 4. Segmental air trapping in the right middle lobe with diffuse haziness of the right upper and right lower lobes. 5. Ascites. Electronically Signed   By: Anner Crete M.D.   On: 04/27/2016 05:40   Ct Abdomen Pelvis W Contrast  Result Date: 04/25/2016 CLINICAL DATA:  61 year old female with history of abdominal distension and nausea. No recent vomiting. EXAM: CT ABDOMEN AND PELVIS WITH CONTRAST TECHNIQUE: Multidetector CT imaging of the abdomen and pelvis was performed using the  standard protocol following bolus administration of intravenous contrast. CONTRAST:  1 ISOVUE-300 IOPAMIDOL (ISOVUE-300) INJECTION 61% COMPARISON:  No priors. FINDINGS: Lower chest: Air trapping right middle lobe. Small left pleural effusion. Atherosclerotic calcifications in the left anterior descending and left circumflex coronary arteries. Hepatobiliary: No suspicious cystic or solid hepatic lesions. No intra or extrahepatic biliary ductal dilatation. Small calcified gallstones lying dependently in the gallbladder. No findings to suggest an acute cholecystitis at this time. Pancreas: No pancreatic mass. No pancreatic ductal dilatation. No pancreatic or peripancreatic fluid or inflammatory changes. Spleen: Unremarkable. Adrenals/Urinary Tract: Right kidney and bilateral adrenal  glands are normal in appearance. 1.9 cm simple cyst in the interpolar region of the left kidney. No hydroureteronephrosis. Urinary bladder is normal in appearance. Stomach/Bowel: The appearance of the stomach is normal. There is no pathologic dilatation of small bowel or colon. Appendix is not confidently visualized. Vascular/Lymphatic: No significant atherosclerotic disease, aneurysm or dissection identified in the abdominal or pelvic vasculature. No lymphadenopathy noted in the abdomen or pelvis. Reproductive: Large complex mixed cystic and solid mass which emanates from one or both of the adnexal regions, highly concerning for ovarian neoplasm. This measures approximately 20.5 x 23.1 x 24.6 cm (coronal image 46 of series 6 and sagittal image 65 of series 7), and has mixed cystic and solid components, with some heterogeneous internal calcification. Uterus is also heterogeneous in appearance, with multiple densely calcified lesions, presumably multiple fibroids, largest of which measures 5.5 cm in the anterior aspect of the fundal region. Other: Large volume of ascites. Extensive nodular soft tissue thickening throughout the omentum, compatible with omental caking. Some of this extends through the umbilical region. No pneumoperitoneum. Musculoskeletal: There are no aggressive appearing lytic or blastic lesions noted in the visualized portions of the skeleton. IMPRESSION: 1. 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 Consultation with Gynecology is recommended. 2. Small left pleural effusion. 3. Aortic atherosclerosis, in addition to 2 vessel coronary artery disease. Please note that although the presence of coronary artery calcium documents the presence of coronary artery disease, the severity of this disease and any potential stenosis cannot be assessed on this non-gated CT examination. Assessment for potential risk factor  modification, dietary therapy or pharmacologic therapy may be warranted, if clinically indicated. 4. Cholelithiasis. Electronically Signed   By: Vinnie Langton M.D.   On: 04/25/2016 18:59   US Paracentesis  Result Date: 04/26/2016 INDICATION: Patient with abdominal pain and large complex solid/ cystic adnexal mass, ascites, omental thickening concerning for ovarian cancer; request received for diagnostic and therapeutic paracentesis. EXAM: ULTRASOUND GUIDED DIAGNOSTIC AND THERAPEUTIC PARACENTESIS MEDICATIONS: None. COMPLICATIONS: None immediate. PROCEDURE: Informed written consent was obtained from the patient after a discussion of the risks, benefits and alternatives to treatment. A timeout was performed prior to the initiation of the procedure. Initial ultrasound scanning demonstrates a moderate amount of ascites within the left mid to lower abdominal quadrant. The left mid to lower abdomen was prepped and draped in the usual sterile fashion. 1% lidocaine was used for local anesthesia. Following this, a Yueh catheter was introduced. An ultrasound image was saved for documentation purposes. The paracentesis was performed. The catheter was removed and a dressing was applied. The patient tolerated the procedure well without immediate post procedural complication. FINDINGS: A total of approximately 2.5 liters of turbid, bloody fluid was removed. Samples were sent to the laboratory as requested by the clinical team. IMPRESSION: Successful ultrasound-guided diagnostic and therapeutic paracentesis yielding 2.5  liters of peritoneal fluid. Read by: Rowe Robert, PA-C Electronically Signed   By: Corrie Mckusick D.O.   On: 04/26/2016 13:58   Ct Biopsy  Result Date: 05/01/2016 INDICATION: LARGE ABDOMINOPELVIC COMPLEX CYSTIC MASS INVOLVING THE OMENTUM EXAM: CT-GUIDED BIOPSY ANTERIOR OMENTAL MASS MEDICATIONS: 1% LIDOCAINE LOCALLY ANESTHESIA/SEDATION: 1.0 mg IV Versed; 25 mcg IV Fentanyl Moderate Sedation Time:  13  minutes The patient was continuously monitored during the procedure by the interventional radiology nurse under my direct supervision. PROCEDURE: The procedure, risks, benefits, and alternatives were explained to the patient. Questions regarding the procedure were encouraged and answered. The patient understands and consents to the procedure. Previous imaging reviewed. Patient positioned supine. Noncontrast localization CT performed. The large abdominopelvic mass involving the omentum was localized. Omental component was marked in the anterior abdomen just above the umbilicus. Under sterile conditions and local anesthesia, a 17 gauge coaxial guide needle was advanced into the anterior omental mass. Needle position confirmed with CT. 18 gauge core biopsies obtained. These were placed in formalin. Patient tolerated the procedure well without complication. Vital sign monitoring by nursing staff during the procedure will continue as patient is in the special procedures unit for post procedure observation. FINDINGS: The images document guide needle placement within the anterior omental mass. Post biopsy images demonstrate no hemorrhage or hematoma. COMPLICATIONS: None immediate. IMPRESSION: Successful CT-guided anterior omental mass 18 gauge core biopsy Electronically Signed   By: Jerilynn Mages.  Shick M.D.   On: 05/01/2016 15:17   Dg Chest Port 1 View  Result Date: 04/25/2016 CLINICAL DATA:  Abdominal distension with fever EXAM: PORTABLE CHEST 1 VIEW COMPARISON:  None. FINDINGS: Low lung volumes. Non inclusion of the left CP angle. Borderline to mild cardiomegaly, exaggerated by low lung volume. Mild atherosclerosis. No focal consolidation. No pneumothorax. IMPRESSION: Low lung volumes, without infiltrate or edema. Borderline to mild cardiomegaly. Electronically Signed   By: Donavan Foil M.D.   On: 04/25/2016 17:54   Ir Paracentesis  Result Date: 04/28/2016 INDICATION: Patient with ascites. Request is made for diagnostic  and therapeutic paracentesis. EXAM: ULTRASOUND GUIDED DIAGNOSTIC AND THERAPEUTIC PARACENTESIS MEDICATIONS: 10 mL 1% lidocaine COMPLICATIONS: None immediate. PROCEDURE: Informed written consent was obtained from the patient after a discussion of the risks, benefits and alternatives to treatment. A timeout was performed prior to the initiation of the procedure. Initial ultrasound scanning demonstrates a large amount of ascites within the right lateral abdomen. The right lateral abdomen was prepped and draped in the usual sterile fashion. 1% lidocaine was used for local anesthesia. Following this, a 19 gauge, 7-cm, Yueh catheter was introduced. An ultrasound image was saved for documentation purposes. The paracentesis was performed. The catheter was removed and a dressing was applied. The patient tolerated the procedure well without immediate post procedural complication. FINDINGS: A total of approximately 530 mL of thick, amber fluid was removed. Samples were sent to the laboratory as requested by the clinical team. IMPRESSION: Successful ultrasound-guided diagnostic and therapeutic paracentesis yielding 530 liters of peritoneal fluid. Read by:  Brynda Greathouse PA-C Electronically Signed   By: Corrie Mckusick D.O.   On: 04/28/2016 13:59     LOS: 7 days   Signature  Lala Lund M.D on 05/02/2016 at 10:47 AM  Between 7am to 7pm - Pager - [phone] ( page via [email or web address], text pages only, please mention full 10 digit call back number).  After 7pm go to [email or web address] - password [email or web address]

## 2016-05-02 NOTE — Progress Notes (Signed)
VASCULAR LAB PRELIMINARY  PRELIMINARY  PRELIMINARY  PRELIMINARY  Bilateral lower extremity venous duplex completed.    Preliminary report:  There is no DVT noted in the bilateral lower extremities.  There is superficial thrombosis noted in the left small saphenous vein.  There is a small Baker's cyst noted in the right popliteal fossa.   Joaovictor Krone, RVT 05/02/2016, 9:46 AM

## 2016-05-02 NOTE — Progress Notes (Signed)
Physical Therapy Treatment Patient Details Name: Allison Haynes MRN: [id number] DOB: May 02, 1955 Today's Date: 05/02/2016    History of Present Illness Allison Haynes is a 61 y.o. Female with no known medical problems prior to this hospital admission who presented to [facility] with severe abdominal pain and distention on 04/25/16.  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    PT Comments    Pt seen for mobility progression. All VSS throughout. Pt only agreeable to ambulate within room and requesting to stop after ~50'. Pt remains uncomfortable with abdominal swelling. Pt would continue to benefit from skilled physical therapy services at this time while admitted and after d/c to address the below listed limitations in order to improve overall safety and independence with functional mobility.    Follow Up Recommendations  SNF     Equipment Recommendations  Rolling walker with 5" wheels    Recommendations for Other Services       Precautions / Restrictions Precautions Precautions: Fall Restrictions Weight Bearing Restrictions: No    Mobility  Bed Mobility               General bed mobility comments: pt sitting OOB in recliner chair when therapist entered room  Transfers Overall transfer level: Needs assistance Equipment used: Rolling walker (2 wheeled) Transfers: Sit to/from Stand Sit to Stand: Supervision         General transfer comment: increased time, good technique, supervision for safety  Ambulation/Gait Ambulation/Gait assistance: Min guard Ambulation Distance (Feet): 50 Feet Assistive device: Rolling walker (2 wheeled) Gait Pattern/deviations: Step-through pattern;Decreased stride length;Trunk flexed Gait velocity: decreased Gait velocity  interpretation: Below normal speed for age/gender General Gait Details: mild instability but no LOB or need for physical assistance, min guard for safety. Pt ambulated on RA with VSS throughout.   Stairs            Wheelchair Mobility    Modified Rankin (Stroke Patients Only)       Balance Overall balance assessment: Needs assistance Sitting-balance support: Feet supported Sitting balance-Leahy Scale: Good     Standing balance support: During functional activity Standing balance-Leahy Scale: Poor Standing balance comment: static balance without UE support, but required bilateral UEs on RW for dynamic                            Cognition Arousal/Alertness: Awake/alert Behavior During Therapy: WFL for tasks assessed/performed;Flat affect Overall Cognitive Status: Within Functional Limits for tasks assessed                                        Exercises      General Comments        Pertinent Vitals/Pain Pain Assessment: Faces Faces Pain Scale: Hurts even more Pain Location: stomach Pain Descriptors / Indicators: Sore Pain Intervention(s): Monitored during session;Repositioned    Home Living                      Prior Function            PT Goals (current goals can now be found in the care plan section) Acute Rehab PT Goals PT Goal Formulation: With patient Time For Goal Achievement: 05/11/16 Potential to Achieve Goals: Fair  Progress towards PT goals: Progressing toward goals    Frequency    Min 3X/week      PT Plan Current plan remains appropriate    Co-evaluation             End of Session Equipment Utilized During Treatment: Gait belt Activity Tolerance: Patient tolerated treatment well Patient left: in chair;with call bell/phone within reach;with family/visitor present Nurse Communication: Mobility status PT Visit Diagnosis: Difficulty in walking, not elsewhere classified (R26.2);Muscle weakness  (generalized) (M62.81)     Time: 4034-7425 PT Time Calculation (min) (ACUTE ONLY): 19 min  Charges:  $Gait Training: 8-22 mins                    G Codes:       [place], [place], [place] [facility] 05/02/2016, 3:08 PM

## 2016-05-02 NOTE — Progress Notes (Signed)
Pt refused to be ambulated at 1700.

## 2016-05-02 NOTE — Progress Notes (Signed)
Allison Haynes is a 61 y.o. female patient was transferred  from [facility] awake, alert - oriented  X 4 - no acute distress noted.  VSS - Blood pressure 113/65, pulse 75, temperature 97.5 F (36.4 C), resp. rate 17, height 5\' 5"  (1.651 m), weight 93.1 kg (205 lb 4 oz), SpO2 97 %.    IV in place, occlusive dsg intact without redness.  Orientation to room, and floor completed with information packet given to patient/family.  Patient declined safety video at this time.  Admission INP armband ID verified with patient/family, and in place.   SR up x 2, fall assessment complete, with patient and family able to verbalize understanding of risk associated with falls, and verbalized understanding to call nsg before up out of bed.  Call light within reach, patient able to voice, and demonstrate understanding.  Skin, clean-dry- intact with evidence of generalized  Bruising.    Will cont to eval and treat per MD orders.  Dorris Carnes, RN 05/02/2016 7:16 PM

## 2016-05-03 LAB — CULTURE, BODY FLUID-BOTTLE

## 2016-05-03 LAB — BASIC METABOLIC PANEL
Anion gap: 10 (ref 5–15)
BUN: 22 mg/dL — ABNORMAL HIGH (ref 6–20)
CALCIUM: 7.8 mg/dL — AB (ref 8.9–10.3)
CO2: 25 mmol/L (ref 22–32)
CREATININE: 0.62 mg/dL (ref 0.44–1.00)
Chloride: 99 mmol/L — ABNORMAL LOW (ref 101–111)
Glucose, Bld: 108 mg/dL — ABNORMAL HIGH (ref 65–99)
Potassium: 3.7 mmol/L (ref 3.5–5.1)
SODIUM: 134 mmol/L — AB (ref 135–145)

## 2016-05-03 LAB — CBC
HCT: 26.2 % — ABNORMAL LOW (ref 36.0–46.0)
Hemoglobin: 7.9 g/dL — ABNORMAL LOW (ref 12.0–15.0)
MCH: 20.7 pg — ABNORMAL LOW (ref 26.0–34.0)
MCHC: 30.2 g/dL (ref 30.0–36.0)
MCV: 68.6 fL — ABNORMAL LOW (ref 78.0–100.0)
PLATELETS: 348 10*3/uL (ref 150–400)
RBC: 3.82 MIL/uL — ABNORMAL LOW (ref 3.87–5.11)
RDW: 24.4 % — AB (ref 11.5–15.5)
WBC: 17.4 10*3/uL — AB (ref 4.0–10.5)

## 2016-05-03 LAB — CULTURE, BLOOD (ROUTINE X 2)
Culture: NO GROWTH
Special Requests: ADEQUATE

## 2016-05-03 LAB — CULTURE, BODY FLUID W GRAM STAIN -BOTTLE: Culture: NO GROWTH

## 2016-05-03 LAB — GLUCOSE, CAPILLARY: Glucose-Capillary: 104 mg/dL — ABNORMAL HIGH (ref 65–99)

## 2016-05-03 NOTE — Progress Notes (Signed)
   05/03/16 1411  Vitals  BP (!) 96/54  BP Location Right Arm  BP Method Manual  Patient Position (if appropriate) Lying  the above are pt manual BP reading. MD Candiss Norse notified and he ordered for evening dose Lasix to be held.

## 2016-05-03 NOTE — Progress Notes (Signed)
PROGRESS NOTE        PATIENT DETAILS Name: Allison Haynes Age: 61 y.o. Sex: female Date of Birth: 25-Oct-1955 Admit Date: 04/25/2016 Admitting Physician Vianne Bulls, MD PCP:No primary care provider on file.  Brief Narrative: Patient is a 61 y.o. female with no significant medical history-presented to the ED on 4/21 with worsening abdominal pain,distention and fever. CT scan of the abdomen showed a large complex cystic/solid mass emanating from one of both of the adnexal regions with peritoneal/omental caking and potential malignant ascites. She underwent paracentesis 2, with negative cytology. Her initial paracentesis on 4/22, showed gram-positive cocci, but cultures were negative. Blood cultures done on admission was positive for MSSA. 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 Thankfully, with empiric antimicrobial therapy-sepsis pathophysiology has resolved, and she is slowly improving. Please note, hospital course has been complicated by a brief episode of A. fib RVR, multifactorial anemia requiring PRBC 3, and volume overload. See below for further details.  Note: Patient has not seen a physician or sought routine medical in the outpatient setting for approximately 25 years!  Subjective:  Patient sitting up in the chair, denies any headache, no fever or chills, no chest pain, no shortness of breath, abdomen feels distended but not painful. No focal weakness.   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 Blood cultures 1 out of 2 drawn on 04/25/2016 positive for MSSA. Echocardiogram does not show any signs of vegetations.  He underwent CT-guided omental biopsy by IR on 05/01/2016 to correctly diagnose the etiology of her omental mass as paratonia and fluid cytology 2 remained negative. Follow biopsy culture results and cytology.   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 Unfortunately, cytology negative 2- even though CA 125 elevated, she underwent omental biopsy on 05/01/2016 follow cytology.  Paroxysmal atrial fibrillation [place] vasc 2 score off 1. Currently stable on oral beta blocker, echo shows preserved EF with grade 2 chronic diastolic CHF. Currently on Lovenox for SVT see below for details.  Left small saphenous vein SVT. Discussed with vascular surgeon Dr. Scot Dock on 05/02/2016, due to high suspicion for underlying malignancy for now start her on Lovenox and monitor.   Significant volume overload with lower extremity edema: The combination of adnexal mass and omental metastases along with possible acute on chronic diastolic CHF. Continue diuresis, unfortunately venous ultrasound shows right superficial SVT in the lower extremity. Apply Unna boots, anticoagulate and monitor.  Anemia of chronic disease: Suspect mostly has anemia of chronic disease with elements of vitamin B12 deficiency and some iron deficiency (soluble transferrin receptor assay slightly increased). Anemia has worsened due to critical illness as well. She has required 3 units of PRBC so far, she has been started on vitamin B12 and iron supplementation. Trend hemoglobin periodically. No signs of ongoing GI blood loss, and oral PPI.  Minimally elevated Troponin level: Likely secondary to demand  ischemia-doubt ACS. Given issues with malignancy. severity of anemia-doubt further workup needed at this time. Echo showed preserved EF without any wall motion abnormalities  Left arm ecchymosis: Occurred when IV infiltrated when she was receiving of PRBC transfusion. This appears stable-ecchymosis mostly appears to be very superficial.  Thrombocytosis: Likely reactive-due to underlying malignancy/chronic inflammation.  Moderate protein calorie malnutrition: Seen by nutrition, continue supplements.  Scalp mass: Per patient is has been there for the past 12 years-see pictures below. I do not think this is related to her presentation. She will likely require a biopsy-at some point-probably as an outpatient. See pictures below.   DVT Prophylaxis: Prophylactic Lovenox   Code Status: Full code   Family Communication: None at bedside   Disposition Plan:  Tele  Antimicrobial agents: Anti-infectives    Start     Dose/Rate Route Frequency Ordered Stop   04/28/16 0500  vancomycin (VANCOCIN) 1,500 mg in sodium chloride 0.9 % 500 mL IVPB  Status:  Discontinued     1,500 mg 250 mL/hr over 120 Minutes Intravenous Every 24 hours 04/27/16 0908 04/27/16 1544   04/27/16 1700  ceFAZolin (ANCEF) IVPB 1 g/50 mL premix  Status:  Discontinued     1 g 100 mL/hr over 30 Minutes Intravenous Every 8 hours 04/27/16 1544 04/27/16 1548   04/27/16 1700  ceFAZolin (ANCEF) IVPB 2g/100 mL premix     2 g 200 mL/hr over 30 Minutes Intravenous Every 8 hours 04/27/16 1548     04/26/16 1700  vancomycin (VANCOCIN) 1,250 mg in sodium chloride 0.9 % 250 mL IVPB  Status:  Discontinued     1,250 mg 166.7 mL/hr over 90 Minutes Intravenous Every 12 hours 04/26/16 0908 04/27/16 0908   04/26/16 0200  piperacillin-tazobactam (ZOSYN) IVPB 3.375 g  Status:  Discontinued     3.375 g 12.5 mL/hr over 240 Minutes Intravenous Every 8 hours 04/25/16 1827 04/27/16 1544   04/25/16 2300  vancomycin (VANCOCIN) IVPB 1000 mg/200 mL  premix  Status:  Discontinued     1,000 mg 200 mL/hr over 60 Minutes Intravenous Every 8 hours 04/25/16 2247 04/26/16 0908   04/25/16 1745  piperacillin-tazobactam (ZOSYN) IVPB 3.375 g     3.375 g 100 mL/hr over 30 Minutes Intravenous  Once 04/25/16 1738 04/25/16 1854      Procedures:  Bilateral lower extremity venous duplex completed.  Preliminary report:  There is no DVT noted in the bilateral lower extremities.  There is superficial thrombosis noted in the left small saphenous vein.  There is a small Baker's cyst noted in the right popliteal fossa.   US paracentesis - Successful ultrasound-guided diagnostic and therapeutic paracentesis yielding 530 liters of peritoneal fluid.  CT guided - omental biopsy 05-02-16 -    CONSULTS:  GYN Oncology  Time spent: 30-minutes-Greater than 50% of this time was spent in counseling, explanation of diagnosis, planning of further management, and coordination of care.  MEDICATIONS: Scheduled Meds: . enoxaparin (LOVENOX) injection  1.5 mg/kg Subcutaneous Q24H  . ferrous sulfate  325 mg Oral BID WC  . furosemide  40 mg Intravenous BID  . metoprolol tartrate  50 mg Oral BID  . multivitamin with minerals  1 tablet Oral Daily  . pantoprazole  40 mg Oral Daily  . polyethylene glycol  17 g Oral Daily  . potassium chloride  20 mEq Oral Daily  . protein supplement shake  11 oz Oral TID BM  . sodium chloride  1  spray Each Nare QID  . sodium chloride flush  3 mL Intravenous Q12H  . spironolactone  25 mg Oral Daily  . vitamin B-12  1,000 mcg Oral Daily   Continuous Infusions: .  ceFAZolin (ANCEF) IV 2 g (05/03/16 0855)   PRN Meds:.acetaminophen **OR** [DISCONTINUED] acetaminophen, metoprolol, oxyCODONE, promethazine   PHYSICAL EXAM:  Vital signs: Vitals:   05/02/16 0812 05/02/16 1619 05/02/16 2029 05/03/16 0457  BP: 133/72 113/65 128/64 (!) 126/54  Pulse: 75 75 82 64  Resp: (!) 22 17 18 18   Temp: 97.6 F (36.4 C) 97.5 F (36.4 C) 98.1 F  (36.7 C) 98.5 F (36.9 C)  TempSrc: Oral  Oral Oral  SpO2: 98% 97% 97% 97%  Weight:    93.3 kg (205 lb 9.6 oz)  Height:       Filed Weights   05/01/16 0823 05/02/16 0500 05/03/16 0457  Weight: 91.5 kg (201 lb 11.2 oz) 93.1 kg (205 lb 4 oz) 93.3 kg (205 lb 9.6 oz)   Body mass index is 34.21 kg/m.   Awake Alert, Oriented X 3, No new F.N deficits, Normal affect [place].AT,PERRAL Supple Neck,No JVD, No cervical lymphadenopathy appriciated.  Symmetrical Chest wall movement, Good air movement bilaterally, CTAB RRR,No Gallops,Rubs or new Murmurs, No Parasternal Heave +ve B.Sounds, Abd Soft, No tenderness, No organomegaly appriciated, No rebound - guarding or rigidity. No Cyanosis, Clubbing or edema, No new Rash,   stable L arm bruise  Note picture taken on 4/27 after patient's permission.     I have personally reviewed following labs and imaging studies  LABORATORY DATA: CBC:  Recent Labs Lab 04/27/16 0309 04/28/16 0414  04/29/16 1837 04/30/16 0247 05/01/16 0248 05/02/16 0427 05/03/16 0557  WBC 33.8* 27.1*  < > 26.1* 23.4* 23.1* 21.6* 17.4*  NEUTROABS 32.1* 26.0*  --   --   --   --  19.4*  --   HGB 7.1* 7.1*  < > 7.6* 6.9* 8.2* 7.9* 7.9*  HCT 26.1* 24.8*  < > 27.6* 25.1* 27.4* 28.3* 26.2*  MCV 66.4* 66.5*  < > 66.0* 66.1* 67.7* 68.9* 68.6*  PLT 427* 366  < > 396 365 364 375 348  < > = values in this interval not displayed.  Basic Metabolic Panel:  Recent Labs Lab 04/29/16 0219 04/30/16 0247 05/01/16 0248 05/02/16 0427 05/03/16 0557  NA 130* 131* 133* 135 134*  K 4.5 4.1 3.5 3.7 3.7  CL 101 100* 100* 102 99*  CO2 20* 22 23 27 25   GLUCOSE 117* 127* 107* 116* 108*  BUN 31* 29* 26* 25* 22*  CREATININE 0.83 0.88 0.73 0.68 0.62  CALCIUM 7.7* 7.8* 7.9* 7.9* 7.8*    GFR: Estimated Creatinine Clearance: 84.4 mL/min (by C-G formula based on SCr of 0.62 mg/dL).  Liver Function Tests:  Recent Labs Lab 04/27/16 0309 04/28/16 0414 04/29/16 0219 05/01/16 0248  05/02/16 0427  AST 19 14* 13* 15 26  ALT 9* 10* 7* 6* 7*  ALKPHOS 63 78 84 100 84  BILITOT 1.3* 1.0 0.6 0.6 0.2*  PROT 5.1* 5.2* 5.3* 5.4* 5.3*  ALBUMIN 2.1* 1.9* 1.8* 1.9* 1.8*   No results for input(s): LIPASE, AMYLASE in the last 168 hours. No results for input(s): AMMONIA in the last 168 hours.  Coagulation Profile:  Recent Labs Lab 05/01/16 0958  INR 1.14    Cardiac Enzymes:  Recent Labs Lab 04/27/16 0309  TROPONINI 0.11*    BNP (last 3 results) No results for input(s): PROBNP in the last  8760 hours.  HbA1C: No results for input(s): HGBA1C in the last 72 hours.  CBG:  Recent Labs Lab 04/30/16 0802 05/01/16 0733 05/02/16 0810 05/02/16 2028 05/03/16 0755  GLUCAP 114* 111* 103* 145* 104*    Lipid Profile: No results for input(s): CHOL, HDL, LDLCALC, TRIG, CHOLHDL, LDLDIRECT in the last 72 hours.  Thyroid Function Tests: No results for input(s): TSH, T4TOTAL, FREET4, T3FREE, THYROIDAB in the last 72 hours.  Anemia Panel: No results for input(s): VITAMINB12, FOLATE, FERRITIN, TIBC, IRON, RETICCTPCT in the last 72 hours.  Urine analysis:    Component Value Date/Time   COLORURINE AMBER (A) 04/26/2016 0530   APPEARANCEUR CLEAR 04/26/2016 0530   LABSPEC >1.046 (H) 04/26/2016 0530   PHURINE 5.0 04/26/2016 0530   GLUCOSEU NEGATIVE 04/26/2016 0530   HGBUR NEGATIVE 04/26/2016 0530   BILIRUBINUR NEGATIVE 04/26/2016 0530   KETONESUR NEGATIVE 04/26/2016 0530   PROTEINUR NEGATIVE 04/26/2016 0530   NITRITE NEGATIVE 04/26/2016 0530   LEUKOCYTESUR NEGATIVE 04/26/2016 0530    Sepsis Labs: Lactic Acid, Venous    Component Value Date/Time   LATICACIDVEN 1.4 04/27/2016 0309    MICROBIOLOGY: Recent Results (from the past 240 hour(s))  Blood Culture (routine x 2)     Status: None   Collection Time: 04/25/16  5:54 PM  Result Value Ref Range Status   Specimen Description BLOOD RIGHT WRIST  Final   Special Requests IN PEDIATRIC BOTTLE Blood Culture adequate  volume  Final   Culture NO GROWTH 5 DAYS  Final   Report Status 04/30/2016 FINAL  Final  Blood Culture (routine x 2)     Status: Abnormal   Collection Time: 04/25/16  5:58 PM  Result Value Ref Range Status   Specimen Description BLOOD RIGHT ANTECUBITAL  Final   Special Requests   Final    BOTTLES DRAWN AEROBIC AND ANAEROBIC Blood Culture adequate volume   Culture  Setup Time   Final    GRAM POSITIVE COCCI IN CLUSTERS AEROBIC BOTTLE ONLY Organism ID to follow CRITICAL RESULT CALLED TO, READ BACK BY AND VERIFIED WITH: AElta [place].D. 12:00 04/27/16 ([email or web address])    Culture STAPHYLOCOCCUS AUREUS (A)  Final   Report Status 04/29/2016 FINAL  Final   Organism ID, Bacteria STAPHYLOCOCCUS AUREUS  Final      Susceptibility   Staphylococcus aureus - MIC*    CIPROFLOXACIN <=0.5 SENSITIVE Sensitive     ERYTHROMYCIN <=0.25 SENSITIVE Sensitive     GENTAMICIN <=0.5 SENSITIVE Sensitive     OXACILLIN <=0.25 SENSITIVE Sensitive     TETRACYCLINE <=1 SENSITIVE Sensitive     VANCOMYCIN 1 SENSITIVE Sensitive     TRIMETH/SULFA <=10 SENSITIVE Sensitive     CLINDAMYCIN <=0.25 SENSITIVE Sensitive     RIFAMPIN <=0.5 SENSITIVE Sensitive     Inducible Clindamycin NEGATIVE Sensitive     * STAPHYLOCOCCUS AUREUS  Blood Culture ID Panel (Reflexed)     Status: Abnormal   Collection Time: 04/25/16  5:58 PM  Result Value Ref Range Status   Enterococcus species NOT DETECTED NOT DETECTED Final   Listeria monocytogenes NOT DETECTED NOT DETECTED Final   Staphylococcus species DETECTED (A) NOT DETECTED Final    Comment: CRITICAL RESULT CALLED TO, READ BACK BY AND VERIFIED WITH: AElta [place].D. 12:00 04/27/16 ([email or web address])    Staphylococcus aureus DETECTED (A) NOT DETECTED Final    Comment: Methicillin (oxacillin) susceptible Staphylococcus aureus (MSSA). Preferred therapy is anti staphylococcal beta lactam antibiotic (Cefazolin or Nafcillin), unless clinically contraindicated. CRITICAL RESULT CALLED TO, READ BACK  BY  AND VERIFIED WITH: AElta [place].D. 12:00 04/27/16 ([email or web address])    Methicillin resistance NOT DETECTED NOT DETECTED Final   Streptococcus species NOT DETECTED NOT DETECTED Final   Streptococcus agalactiae NOT DETECTED NOT DETECTED Final   Streptococcus pneumoniae NOT DETECTED NOT DETECTED Final   Streptococcus pyogenes NOT DETECTED NOT DETECTED Final   Acinetobacter baumannii NOT DETECTED NOT DETECTED Final   Enterobacteriaceae species NOT DETECTED NOT DETECTED Final   Enterobacter cloacae complex NOT DETECTED NOT DETECTED Final   Escherichia coli NOT DETECTED NOT DETECTED Final   Klebsiella oxytoca NOT DETECTED NOT DETECTED Final   Klebsiella pneumoniae NOT DETECTED NOT DETECTED Final   Proteus species NOT DETECTED NOT DETECTED Final   Serratia marcescens NOT DETECTED NOT DETECTED Final   Haemophilus influenzae NOT DETECTED NOT DETECTED Final   Neisseria meningitidis NOT DETECTED NOT DETECTED Final   Pseudomonas aeruginosa NOT DETECTED NOT DETECTED Final   Candida albicans NOT DETECTED NOT DETECTED Final   Candida glabrata NOT DETECTED NOT DETECTED Final   Candida krusei NOT DETECTED NOT DETECTED Final   Candida parapsilosis NOT DETECTED NOT DETECTED Final   Candida tropicalis NOT DETECTED NOT DETECTED Final  MRSA PCR Screening     Status: None   Collection Time: 04/25/16 10:25 PM  Result Value Ref Range Status   MRSA by PCR NEGATIVE NEGATIVE Final    Comment:        The GeneXpert MRSA Assay (FDA approved for NASAL specimens only), is one component of a comprehensive MRSA colonization surveillance program. It is not intended to diagnose MRSA infection nor to guide or monitor treatment for MRSA infections.   Urine culture     Status: None   Collection Time: 04/26/16  5:30 AM  Result Value Ref Range Status   Specimen Description URINE, RANDOM  Final   Special Requests NONE  Final   Culture NO GROWTH  Final   Report Status 04/27/2016 FINAL  Final  Culture, body  fluid-bottle     Status: Abnormal   Collection Time: 04/26/16  1:36 PM  Result Value Ref Range Status   Specimen Description FLUID  Final   Special Requests PERITONEAL  Final   Gram Stain   Final    GRAM POSITIVE COCCI IN CLUSTERS ANAEROBIC BOTTLE ONLY CRITICAL RESULT CALLED TO, READ BACK BY AND VERIFIED WITH: C. STATEN RN, AT 1347 04/30/16 BY D. VANHOOK    Culture STAPHYLOCOCCUS AUREUS (A)  Final   Report Status 05/02/2016 FINAL  Final   Organism ID, Bacteria STAPHYLOCOCCUS AUREUS  Final      Susceptibility   Staphylococcus aureus - MIC*    CIPROFLOXACIN <=0.5 SENSITIVE Sensitive     ERYTHROMYCIN <=0.25 SENSITIVE Sensitive     GENTAMICIN <=0.5 SENSITIVE Sensitive     OXACILLIN <=0.25 SENSITIVE Sensitive     TETRACYCLINE <=1 SENSITIVE Sensitive     VANCOMYCIN 1 SENSITIVE Sensitive     TRIMETH/SULFA <=10 SENSITIVE Sensitive     CLINDAMYCIN <=0.25 SENSITIVE Sensitive     RIFAMPIN <=0.5 SENSITIVE Sensitive     Inducible Clindamycin NEGATIVE Sensitive     * STAPHYLOCOCCUS AUREUS  Gram stain     Status: None   Collection Time: 04/26/16  1:36 PM  Result Value Ref Range Status   Specimen Description FLUID  Final   Special Requests PERITONEAL  Final   Gram Stain   Final    WBC PRESENT, PREDOMINANTLY PMN GRAM POSITIVE COCCI IN CLUSTERS IN PAIRS    Report Status 04/26/2016 FINAL  Final  Culture, blood (Routine X 2) w Reflex to ID Panel     Status: None (Preliminary result)   Collection Time: 04/28/16  4:14 AM  Result Value Ref Range Status   Specimen Description BLOOD LEFT ARM  Final   Special Requests AEROBIC BOTTLE ONLY Blood Culture adequate volume  Final   Culture NO GROWTH 4 DAYS  Final   Report Status PENDING  Incomplete  Culture, body fluid-bottle     Status: None (Preliminary result)   Collection Time: 04/28/16 11:40 AM  Result Value Ref Range Status   Specimen Description FLUID PERITONEAL  Final   Special Requests NONE  Final   Culture NO GROWTH 4 DAYS  Final   Report  Status PENDING  Incomplete  Gram stain     Status: None   Collection Time: 04/28/16 11:40 AM  Result Value Ref Range Status   Specimen Description FLUID PERITONEAL  Final   Special Requests NONE  Final   Gram Stain   Final    ABUNDANT WBC PRESENT,BOTH PMN AND MONONUCLEAR NO ORGANISMS SEEN    Report Status 04/29/2016 FINAL  Final    RADIOLOGY STUDIES/RESULTS: Ct Angio Chest Pe W Or Wo Contrast  Result Date: 04/27/2016 CLINICAL DATA:  61 year old female with history of ovarian cancer presenting with tachycardia. EXAM: CT ANGIOGRAPHY CHEST WITH CONTRAST TECHNIQUE: Multidetector CT imaging of the chest was performed using the standard protocol during bolus administration of intravenous contrast. Multiplanar CT image reconstructions and MIPs were obtained to evaluate the vascular anatomy. CONTRAST:  100 cc Isovue 370 COMPARISON:  Chest radiograph dated 04/25/2016 and CT of the abdomen pelvis dated 04/25/2016 FINDINGS: Cardiovascular: There is mild cardiomegaly. No pericardial effusion. There is coronary vascular calcification involving the LAD. The thoracic aorta appears unremarkable. The origins of the great vessels of the aortic arch appear patent. There is dilatation of the main pulmonary trunk suggestive of underlying pulmonary hypertension. Evaluation of the pulmonary arteries is limited due to suboptimal opacification of the peripheral branches. No central pulmonary artery embolus identified. Mediastinum/Nodes: There is no hilar or mediastinal adenopathy. The esophagus and the thyroid gland are grossly unremarkable. Lungs/Pleura: There is a small left pleural effusion. There are consolidative changes of the left lower lobe as well as scattered densities in the left upper lobe. Overall there is volume loss in the left lung with shift of the heart into the left hemithorax. There is segmental air trapping in the right middle lobe with diffuse hazy airspace density in the right upper and right lower  lobes. Paramediastinal subpleural density in the right upper lobe likely represents atelectatic changes or pleural thickening. There is no pneumothorax. Upper Abdomen: Ascites is partially visualized. Musculoskeletal: Multilevel degenerative changes of the spine. No acute osseous pathology. Review of the MIP images confirms the above findings. IMPRESSION: 1. No CT evidence of pulmonary artery embolus. 2. Areas of consolidative change involving the left lower lobe as well as scattered pulmonary densities in the left upper lobe. Findings may represent pneumonia. Correlation with clinical exam recommended. 3. Overall decreased left lung volume with leftward shift of the mediastinum and heart. 4. Segmental air trapping in the right middle lobe with diffuse haziness of the right upper and right lower lobes. 5. Ascites. Electronically Signed   By: Anner Crete M.D.   On: 04/27/2016 05:40   Ct Abdomen Pelvis W Contrast  Result Date: 04/25/2016 CLINICAL DATA:  61 year old female with history of abdominal distension and nausea. No recent vomiting. EXAM: CT ABDOMEN AND PELVIS WITH  CONTRAST TECHNIQUE: Multidetector CT imaging of the abdomen and pelvis was performed using the standard protocol following bolus administration of intravenous contrast. CONTRAST:  1 ISOVUE-300 IOPAMIDOL (ISOVUE-300) INJECTION 61% COMPARISON:  No priors. FINDINGS: Lower chest: Air trapping right middle lobe. Small left pleural effusion. Atherosclerotic calcifications in the left anterior descending and left circumflex coronary arteries. Hepatobiliary: No suspicious cystic or solid hepatic lesions. No intra or extrahepatic biliary ductal dilatation. Small calcified gallstones lying dependently in the gallbladder. No findings to suggest an acute cholecystitis at this time. Pancreas: No pancreatic mass. No pancreatic ductal dilatation. No pancreatic or peripancreatic fluid or inflammatory changes. Spleen: Unremarkable. Adrenals/Urinary Tract:  Right kidney and bilateral adrenal glands are normal in appearance. 1.9 cm simple cyst in the interpolar region of the left kidney. No hydroureteronephrosis. Urinary bladder is normal in appearance. Stomach/Bowel: The appearance of the stomach is normal. There is no pathologic dilatation of small bowel or colon. Appendix is not confidently visualized. Vascular/Lymphatic: No significant atherosclerotic disease, aneurysm or dissection identified in the abdominal or pelvic vasculature. No lymphadenopathy noted in the abdomen or pelvis. Reproductive: Large complex mixed cystic and solid mass which emanates from one or both of the adnexal regions, highly concerning for ovarian neoplasm. This measures approximately 20.5 x 23.1 x 24.6 cm (coronal image 46 of series 6 and sagittal image 65 of series 7), and has mixed cystic and solid components, with some heterogeneous internal calcification. Uterus is also heterogeneous in appearance, with multiple densely calcified lesions, presumably multiple fibroids, largest of which measures 5.5 cm in the anterior aspect of the fundal region. Other: Large volume of ascites. Extensive nodular soft tissue thickening throughout the omentum, compatible with omental caking. Some of this extends through the umbilical region. No pneumoperitoneum. Musculoskeletal: There are no aggressive appearing lytic or blastic lesions noted in the visualized portions of the skeleton. IMPRESSION: 1. 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 Consultation with Gynecology is recommended. 2. Small left pleural effusion. 3. Aortic atherosclerosis, in addition to 2 vessel coronary artery disease. Please note that although the presence of coronary artery calcium documents the presence of coronary artery disease, the severity of this disease and any potential stenosis cannot be assessed on this non-gated CT examination.  Assessment for potential risk factor modification, dietary therapy or pharmacologic therapy may be warranted, if clinically indicated. 4. Cholelithiasis. Electronically Signed   By: Vinnie Langton M.D.   On: 04/25/2016 18:59   US Paracentesis  Result Date: 04/26/2016 INDICATION: Patient with abdominal pain and large complex solid/ cystic adnexal mass, ascites, omental thickening concerning for ovarian cancer; request received for diagnostic and therapeutic paracentesis. EXAM: ULTRASOUND GUIDED DIAGNOSTIC AND THERAPEUTIC PARACENTESIS MEDICATIONS: None. COMPLICATIONS: None immediate. PROCEDURE: Informed written consent was obtained from the patient after a discussion of the risks, benefits and alternatives to treatment. A timeout was performed prior to the initiation of the procedure. Initial ultrasound scanning demonstrates a moderate amount of ascites within the left mid to lower abdominal quadrant. The left mid to lower abdomen was prepped and draped in the usual sterile fashion. 1% lidocaine was used for local anesthesia. Following this, a Yueh catheter was introduced. An ultrasound image was saved for documentation purposes. The paracentesis was performed. The catheter was removed and a dressing was applied. The patient tolerated the procedure well without immediate post procedural complication. FINDINGS: A total of approximately 2.5 liters of turbid, bloody fluid was removed. Samples were sent to the laboratory as  requested by the clinical team. IMPRESSION: Successful ultrasound-guided diagnostic and therapeutic paracentesis yielding 2.5 liters of peritoneal fluid. Read by: Rowe Robert, PA-C Electronically Signed   By: Corrie Mckusick D.O.   On: 04/26/2016 13:58   Ct Biopsy  Result Date: 05/01/2016 INDICATION: LARGE ABDOMINOPELVIC COMPLEX CYSTIC MASS INVOLVING THE OMENTUM EXAM: CT-GUIDED BIOPSY ANTERIOR OMENTAL MASS MEDICATIONS: 1% LIDOCAINE LOCALLY ANESTHESIA/SEDATION: 1.0 mg IV Versed; 25 mcg IV  Fentanyl Moderate Sedation Time:  13 minutes The patient was continuously monitored during the procedure by the interventional radiology nurse under my direct supervision. PROCEDURE: The procedure, risks, benefits, and alternatives were explained to the patient. Questions regarding the procedure were encouraged and answered. The patient understands and consents to the procedure. Previous imaging reviewed. Patient positioned supine. Noncontrast localization CT performed. The large abdominopelvic mass involving the omentum was localized. Omental component was marked in the anterior abdomen just above the umbilicus. Under sterile conditions and local anesthesia, a 17 gauge coaxial guide needle was advanced into the anterior omental mass. Needle position confirmed with CT. 18 gauge core biopsies obtained. These were placed in formalin. Patient tolerated the procedure well without complication. Vital sign monitoring by nursing staff during the procedure will continue as patient is in the special procedures unit for post procedure observation. FINDINGS: The images document guide needle placement within the anterior omental mass. Post biopsy images demonstrate no hemorrhage or hematoma. COMPLICATIONS: None immediate. IMPRESSION: Successful CT-guided anterior omental mass 18 gauge core biopsy Electronically Signed   By: Jerilynn Mages.  Shick M.D.   On: 05/01/2016 15:17   Dg Chest Port 1 View  Result Date: 04/25/2016 CLINICAL DATA:  Abdominal distension with fever EXAM: PORTABLE CHEST 1 VIEW COMPARISON:  None. FINDINGS: Low lung volumes. Non inclusion of the left CP angle. Borderline to mild cardiomegaly, exaggerated by low lung volume. Mild atherosclerosis. No focal consolidation. No pneumothorax. IMPRESSION: Low lung volumes, without infiltrate or edema. Borderline to mild cardiomegaly. Electronically Signed   By: Donavan Foil M.D.   On: 04/25/2016 17:54   Ir Paracentesis  Result Date: 04/28/2016 INDICATION: Patient with  ascites. Request is made for diagnostic and therapeutic paracentesis. EXAM: ULTRASOUND GUIDED DIAGNOSTIC AND THERAPEUTIC PARACENTESIS MEDICATIONS: 10 mL 1% lidocaine COMPLICATIONS: None immediate. PROCEDURE: Informed written consent was obtained from the patient after a discussion of the risks, benefits and alternatives to treatment. A timeout was performed prior to the initiation of the procedure. Initial ultrasound scanning demonstrates a large amount of ascites within the right lateral abdomen. The right lateral abdomen was prepped and draped in the usual sterile fashion. 1% lidocaine was used for local anesthesia. Following this, a 19 gauge, 7-cm, Yueh catheter was introduced. An ultrasound image was saved for documentation purposes. The paracentesis was performed. The catheter was removed and a dressing was applied. The patient tolerated the procedure well without immediate post procedural complication. FINDINGS: A total of approximately 530 mL of thick, amber fluid was removed. Samples were sent to the laboratory as requested by the clinical team. IMPRESSION: Successful ultrasound-guided diagnostic and therapeutic paracentesis yielding 530 liters of peritoneal fluid. Read by:  Brynda Greathouse PA-C Electronically Signed   By: Corrie Mckusick D.O.   On: 04/28/2016 13:59     LOS: 8 days   Signature  Lala Lund M.D on 05/03/2016 at 9:00 AM  Between 7am to 7pm - Pager - [phone] ( page via [email or web address], text pages only, please mention full 10 digit call back number).  After 7pm go to [email or web address] - password [facility]

## 2016-05-04 DIAGNOSIS — C786 Secondary malignant neoplasm of retroperitoneum and peritoneum: Secondary | ICD-10-CM

## 2016-05-04 LAB — BASIC METABOLIC PANEL
Anion gap: 7 (ref 5–15)
BUN: 21 mg/dL — ABNORMAL HIGH (ref 6–20)
CALCIUM: 7.9 mg/dL — AB (ref 8.9–10.3)
CO2: 27 mmol/L (ref 22–32)
CREATININE: 0.61 mg/dL (ref 0.44–1.00)
Chloride: 100 mmol/L — ABNORMAL LOW (ref 101–111)
GFR calc non Af Amer: >60 mL/min (ref >60)
Glucose, Bld: 103 mg/dL — ABNORMAL HIGH (ref 65–99)
Potassium: 3.7 mmol/L (ref 3.5–5.1)
SODIUM: 134 mmol/L — AB (ref 135–145)

## 2016-05-04 LAB — CBC
HEMATOCRIT: 28 % — AB (ref 36.0–46.0)
Hemoglobin: 8.2 g/dL — ABNORMAL LOW (ref 12.0–15.0)
MCH: 20.3 pg — ABNORMAL LOW (ref 26.0–34.0)
MCHC: 29.3 g/dL — AB (ref 30.0–36.0)
MCV: 69.5 fL — ABNORMAL LOW (ref 78.0–100.0)
Platelets: 357 10*3/uL (ref 150–400)
RBC: 4.03 MIL/uL (ref 3.87–5.11)
RDW: 25 % — ABNORMAL HIGH (ref 11.5–15.5)
WBC: 17.9 10*3/uL — ABNORMAL HIGH (ref 4.0–10.5)

## 2016-05-04 LAB — GLUCOSE, CAPILLARY: Glucose-Capillary: 89 mg/dL (ref 65–99)

## 2016-05-04 MED ORDER — FUROSEMIDE 10 MG/ML IJ SOLN
40.0000 mg | Freq: Every day | INTRAMUSCULAR | Status: DC
  Administered 2016-05-05 – 2016-05-06 (×2): 40 mg via INTRAVENOUS
  Filled 2016-05-04 (×2): qty 4

## 2016-05-04 MED ORDER — OXYCODONE HCL 5 MG PO TABS
5.0000 mg | ORAL_TABLET | Freq: Four times a day (QID) | ORAL | Status: DC | PRN
  Administered 2016-05-04 – 2016-05-06 (×5): 5 mg via ORAL
  Filled 2016-05-04 (×5): qty 1

## 2016-05-04 MED ORDER — POTASSIUM CHLORIDE CRYS ER 20 MEQ PO TBCR
20.0000 meq | EXTENDED_RELEASE_TABLET | Freq: Every day | ORAL | Status: DC
  Administered 2016-05-05 – 2016-05-06 (×2): 20 meq via ORAL
  Filled 2016-05-04 (×2): qty 1

## 2016-05-04 MED ORDER — BOOST / RESOURCE BREEZE PO LIQD
1.0000 | Freq: Three times a day (TID) | ORAL | Status: DC
  Administered 2016-05-05 – 2016-05-06 (×2): 1 via ORAL

## 2016-05-04 MED ORDER — METOPROLOL TARTRATE 25 MG PO TABS
25.0000 mg | ORAL_TABLET | Freq: Two times a day (BID) | ORAL | Status: DC
  Administered 2016-05-04 – 2016-05-06 (×4): 25 mg via ORAL
  Filled 2016-05-04 (×5): qty 1

## 2016-05-04 MED ORDER — SPIRONOLACTONE 25 MG PO TABS
25.0000 mg | ORAL_TABLET | Freq: Every day | ORAL | Status: DC
  Administered 2016-05-05 – 2016-05-06 (×2): 25 mg via ORAL
  Filled 2016-05-04 (×2): qty 1

## 2016-05-04 NOTE — Progress Notes (Signed)
INFECTIOUS DISEASE PROGRESS NOTE  ID: Allison Haynes is a 61 y.o. female with  Principal Problem:   Sepsis ([place]) Active Problems:   Ovarian cancer ([place])   Malignant ascites   Microcytic anemia   Thrombocytosis ([facility])   Hyponatremia   Severe malnutrition ([facility])   Malnutrition of moderate degree  Subjective: Without complaints  Abtx:  Anti-infectives    Start     Dose/Rate Route Frequency Ordered Stop   04/28/16 0500  vancomycin (VANCOCIN) 1,500 mg in sodium chloride 0.9 % 500 mL IVPB  Status:  Discontinued     1,500 mg 250 mL/hr over 120 Minutes Intravenous Every 24 hours 04/27/16 0908 04/27/16 1544   04/27/16 1700  ceFAZolin (ANCEF) IVPB 1 g/50 mL premix  Status:  Discontinued     1 g 100 mL/hr over 30 Minutes Intravenous Every 8 hours 04/27/16 1544 04/27/16 1548   04/27/16 1700  ceFAZolin (ANCEF) IVPB 2g/100 mL premix     2 g 200 mL/hr over 30 Minutes Intravenous Every 8 hours 04/27/16 1548     04/26/16 1700  vancomycin (VANCOCIN) 1,250 mg in sodium chloride 0.9 % 250 mL IVPB  Status:  Discontinued     1,250 mg 166.7 mL/hr over 90 Minutes Intravenous Every 12 hours 04/26/16 0908 04/27/16 0908   04/26/16 0200  piperacillin-tazobactam (ZOSYN) IVPB 3.375 g  Status:  Discontinued     3.375 g 12.5 mL/hr over 240 Minutes Intravenous Every 8 hours 04/25/16 1827 04/27/16 1544   04/25/16 2300  vancomycin (VANCOCIN) IVPB 1000 mg/200 mL premix  Status:  Discontinued     1,000 mg 200 mL/hr over 60 Minutes Intravenous Every 8 hours 04/25/16 2247 04/26/16 0908   04/25/16 1745  piperacillin-tazobactam (ZOSYN) IVPB 3.375 g     3.375 g 100 mL/hr over 30 Minutes Intravenous  Once 04/25/16 1738 04/25/16 1854      Medications:  Scheduled: . enoxaparin (LOVENOX) injection  1.5 mg/kg Subcutaneous Q24H  . feeding supplement  1 Container Oral TID BM  . ferrous sulfate  325 mg Oral BID WC  . [START ON 05/05/2016] furosemide  40 mg Intravenous Daily  . metoprolol tartrate  25 mg Oral BID  .  multivitamin with minerals  1 tablet Oral Daily  . pantoprazole  40 mg Oral Daily  . polyethylene glycol  17 g Oral Daily  . [START ON 05/05/2016] potassium chloride  20 mEq Oral Daily  . sodium chloride  1 spray Each Nare QID  . [START ON 05/05/2016] spironolactone  25 mg Oral Daily  . vitamin B-12  1,000 mcg Oral Daily    Objective: Vital signs in last 24 hours: Temp:  [97.4 F (36.3 C)-98.3 F (36.8 C)] 97.4 F (36.3 C) (04/30 1352) Pulse Rate:  [66-74] 73 (04/30 1352) Resp:  [16-18] 18 (04/30 1352) BP: (105-124)/(54-60) 105/60 (04/30 1352) SpO2:  [95 %-100 %] 100 % (04/30 1352)   General appearance: alert, cooperative and no distress Resp: clear to auscultation bilaterally Cardio: regular rate and rhythm GI: normal findings: bowel sounds normal and soft, non-tender  Lab Results  Recent Labs  05/03/16 0557 05/04/16 0548  WBC 17.4* 17.9*  HGB 7.9* 8.2*  HCT 26.2* 28.0*  NA 134* 134*  K 3.7 3.7  CL 99* 100*  CO2 25 27  BUN 22* 21*  CREATININE 0.62 0.61   Liver Panel  Recent Labs  05/02/16 0427  PROT 5.3*  ALBUMIN 1.8*  AST 26  ALT 7*  ALKPHOS 84  BILITOT 0.2*  Sedimentation Rate No results for input(s): ESRSEDRATE in the last 72 hours. C-Reactive Protein No results for input(s): CRP in the last 72 hours.  Microbiology: Recent Results (from the past 240 hour(s))  Blood Culture (routine x 2)     Status: None   Collection Time: 04/25/16  5:54 PM  Result Value Ref Range Status   Specimen Description BLOOD RIGHT WRIST  Final   Special Requests IN PEDIATRIC BOTTLE Blood Culture adequate volume  Final   Culture NO GROWTH 5 DAYS  Final   Report Status 04/30/2016 FINAL  Final  Blood Culture (routine x 2)     Status: Abnormal   Collection Time: 04/25/16  5:58 PM  Result Value Ref Range Status   Specimen Description BLOOD RIGHT ANTECUBITAL  Final   Special Requests   Final    BOTTLES DRAWN AEROBIC AND ANAEROBIC Blood Culture adequate volume   Culture   Setup Time   Final    GRAM POSITIVE COCCI IN CLUSTERS AEROBIC BOTTLE ONLY Organism ID to follow CRITICAL RESULT CALLED TO, READ BACK BY AND VERIFIED WITH: AElta [place].D. 12:00 04/27/16 ([email or web address])    Culture STAPHYLOCOCCUS AUREUS (A)  Final   Report Status 04/29/2016 FINAL  Final   Organism ID, Bacteria STAPHYLOCOCCUS AUREUS  Final      Susceptibility   Staphylococcus aureus - MIC*    CIPROFLOXACIN <=0.5 SENSITIVE Sensitive     ERYTHROMYCIN <=0.25 SENSITIVE Sensitive     GENTAMICIN <=0.5 SENSITIVE Sensitive     OXACILLIN <=0.25 SENSITIVE Sensitive     TETRACYCLINE <=1 SENSITIVE Sensitive     VANCOMYCIN 1 SENSITIVE Sensitive     TRIMETH/SULFA <=10 SENSITIVE Sensitive     CLINDAMYCIN <=0.25 SENSITIVE Sensitive     RIFAMPIN <=0.5 SENSITIVE Sensitive     Inducible Clindamycin NEGATIVE Sensitive     * STAPHYLOCOCCUS AUREUS  Blood Culture ID Panel (Reflexed)     Status: Abnormal   Collection Time: 04/25/16  5:58 PM  Result Value Ref Range Status   Enterococcus species NOT DETECTED NOT DETECTED Final   Listeria monocytogenes NOT DETECTED NOT DETECTED Final   Staphylococcus species DETECTED (A) NOT DETECTED Final    Comment: CRITICAL RESULT CALLED TO, READ BACK BY AND VERIFIED WITH: AElta [place].D. 12:00 04/27/16 ([email or web address])    Staphylococcus aureus DETECTED (A) NOT DETECTED Final    Comment: Methicillin (oxacillin) susceptible Staphylococcus aureus (MSSA). Preferred therapy is anti staphylococcal beta lactam antibiotic (Cefazolin or Nafcillin), unless clinically contraindicated. CRITICAL RESULT CALLED TO, READ BACK BY AND VERIFIED WITH: AElta [place].D. 12:00 04/27/16 ([email or web address])    Methicillin resistance NOT DETECTED NOT DETECTED Final   Streptococcus species NOT DETECTED NOT DETECTED Final   Streptococcus agalactiae NOT DETECTED NOT DETECTED Final   Streptococcus pneumoniae NOT DETECTED NOT DETECTED Final   Streptococcus pyogenes NOT DETECTED NOT DETECTED Final    Acinetobacter baumannii NOT DETECTED NOT DETECTED Final   Enterobacteriaceae species NOT DETECTED NOT DETECTED Final   Enterobacter cloacae complex NOT DETECTED NOT DETECTED Final   Escherichia coli NOT DETECTED NOT DETECTED Final   Klebsiella oxytoca NOT DETECTED NOT DETECTED Final   Klebsiella pneumoniae NOT DETECTED NOT DETECTED Final   Proteus species NOT DETECTED NOT DETECTED Final   Serratia marcescens NOT DETECTED NOT DETECTED Final   Haemophilus influenzae NOT DETECTED NOT DETECTED Final   Neisseria meningitidis NOT DETECTED NOT DETECTED Final   Pseudomonas aeruginosa NOT DETECTED NOT DETECTED Final   Candida albicans NOT DETECTED NOT DETECTED Final   Candida glabrata  NOT DETECTED NOT DETECTED Final   Candida krusei NOT DETECTED NOT DETECTED Final   Candida parapsilosis NOT DETECTED NOT DETECTED Final   Candida tropicalis NOT DETECTED NOT DETECTED Final  MRSA PCR Screening     Status: None   Collection Time: 04/25/16 10:25 PM  Result Value Ref Range Status   MRSA by PCR NEGATIVE NEGATIVE Final    Comment:        The GeneXpert MRSA Assay (FDA approved for NASAL specimens only), is one component of a comprehensive MRSA colonization surveillance program. It is not intended to diagnose MRSA infection nor to guide or monitor treatment for MRSA infections.   Urine culture     Status: None   Collection Time: 04/26/16  5:30 AM  Result Value Ref Range Status   Specimen Description URINE, RANDOM  Final   Special Requests NONE  Final   Culture NO GROWTH  Final   Report Status 04/27/2016 FINAL  Final  Culture, body fluid-bottle     Status: Abnormal   Collection Time: 04/26/16  1:36 PM  Result Value Ref Range Status   Specimen Description FLUID  Final   Special Requests PERITONEAL  Final   Gram Stain   Final    GRAM POSITIVE COCCI IN CLUSTERS ANAEROBIC BOTTLE ONLY CRITICAL RESULT CALLED TO, READ BACK BY AND VERIFIED WITH: C. STATEN RN, AT 1347 04/30/16 BY D. VANHOOK     Culture STAPHYLOCOCCUS AUREUS (A)  Final   Report Status 05/02/2016 FINAL  Final   Organism ID, Bacteria STAPHYLOCOCCUS AUREUS  Final      Susceptibility   Staphylococcus aureus - MIC*    CIPROFLOXACIN <=0.5 SENSITIVE Sensitive     ERYTHROMYCIN <=0.25 SENSITIVE Sensitive     GENTAMICIN <=0.5 SENSITIVE Sensitive     OXACILLIN <=0.25 SENSITIVE Sensitive     TETRACYCLINE <=1 SENSITIVE Sensitive     VANCOMYCIN 1 SENSITIVE Sensitive     TRIMETH/SULFA <=10 SENSITIVE Sensitive     CLINDAMYCIN <=0.25 SENSITIVE Sensitive     RIFAMPIN <=0.5 SENSITIVE Sensitive     Inducible Clindamycin NEGATIVE Sensitive     * STAPHYLOCOCCUS AUREUS  Gram stain     Status: None   Collection Time: 04/26/16  1:36 PM  Result Value Ref Range Status   Specimen Description FLUID  Final   Special Requests PERITONEAL  Final   Gram Stain   Final    WBC PRESENT, PREDOMINANTLY PMN GRAM POSITIVE COCCI IN CLUSTERS IN PAIRS    Report Status 04/26/2016 FINAL  Final  Culture, blood (Routine X 2) w Reflex to ID Panel     Status: None   Collection Time: 04/28/16  4:14 AM  Result Value Ref Range Status   Specimen Description BLOOD LEFT ARM  Final   Special Requests AEROBIC BOTTLE ONLY Blood Culture adequate volume  Final   Culture NO GROWTH 5 DAYS  Final   Report Status 05/03/2016 FINAL  Final  Culture, body fluid-bottle     Status: None   Collection Time: 04/28/16 11:40 AM  Result Value Ref Range Status   Specimen Description FLUID PERITONEAL  Final   Special Requests NONE  Final   Culture NO GROWTH 5 DAYS  Final   Report Status 05/03/2016 FINAL  Final  Gram stain     Status: None   Collection Time: 04/28/16 11:40 AM  Result Value Ref Range Status   Specimen Description FLUID PERITONEAL  Final   Special Requests NONE  Final   Gram Stain  Final    ABUNDANT WBC PRESENT,BOTH PMN AND MONONUCLEAR NO ORGANISMS SEEN    Report Status 04/29/2016 FINAL  Final    Studies/Results: No results  found.   Assessment/Plan: MSSA bacteremia, peritonitis Repeat BCx sent 4-24, so far ngtd TTE ED 60-65% Continue ancef Wbc remains elevated Fungal, afb, bacterial Cx not sent from Bx (was sent in formalin).   Pelvic Mass Cytology- no malignant cells (4-21 and 4-24) Tumor markers - CA 125- 227, CEA nl, CA-19-1 nl IR Bx 4-27, await result.   Protein Calorie Malnutrition  Anemia HCT/Hgbdown                                      Bobby Rumpf Infectious Diseases (pager) [phone] [email or web address] 05/04/2016, 5:06 PM  LOS: 9 days

## 2016-05-04 NOTE — Progress Notes (Signed)
PROGRESS NOTE        PATIENT DETAILS Name: Allison Haynes Age: 61 y.o. Sex: female Date of Birth: 1955-04-30 Admit Date: 04/25/2016 Admitting Physician Vianne Bulls, MD PCP:No primary care provider on file.  Brief Narrative: Patient is a 61 y.o. female with no significant medical history-presented to the ED on 4/21 with worsening abdominal pain,distention and fever. CT scan of the abdomen showed a large complex cystic/solid mass emanating from one of both of the adnexal regions with peritoneal/omental caking and potential malignant ascites. She underwent paracentesis 2, with negative cytology. Her initial paracentesis on 4/22, showed gram-positive cocci, but cultures were negative. Blood cultures done on admission was positive for MSSA. 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 Thankfully, with empiric antimicrobial therapy-sepsis pathophysiology has resolved, and she is slowly improving. Please note, hospital course has been complicated by a brief episode of A. fib RVR, multifactorial anemia requiring PRBC 3, and volume overload. See below for further details.  Note: Patient has not seen a physician or sought routine medical in the outpatient setting for approximately 25 years!  Subjective:  Patient in bed, appears comfortable, denies any headache, no fever, no chest pain or pressure, no shortness of breath , no abdominal pain but abdomen feels slightly distended. No focal weakness.  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 Blood cultures 1 out of 2 drawn on 04/25/2016 positive for MSSA. Echocardiogram does not show any signs of vegetations.  She underwent CT-guided omental biopsy by IR on 05/01/2016 to correctly diagnose the etiology of her omental mass as paratonia and fluid cytology 2 remained negative. Follow biopsy culture results and cytology.   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 Unfortunately, cytology negative 2- even though CA 125 elevated, she underwent omental biopsy on 05/01/2016 follow cytology.  Paroxysmal atrial fibrillation [place] vasc 2 score off 1. Currently stable on oral beta blocker, echo shows preserved EF with grade 2 chronic diastolic CHF. Currently on Lovenox for SVT see below for details.  Left small saphenous vein SVT. Discussed with vascular surgeon Dr. Scot Dock on 05/02/2016, due to high suspicion for underlying malignancy for now start her on Lovenox and monitor.   Significant volume overload with lower extremity edema: The combination of adnexal mass and omental metastases along with possible acute on chronic diastolic CHF. Continue diuresis, unfortunately venous ultrasound shows right superficial SVT in the lower extremity. Apply Unna boots, anticoagulate and monitor.  Anemia of chronic disease: Suspect mostly has anemia of chronic disease with elements of vitamin B12 deficiency and some iron deficiency (soluble transferrin receptor assay slightly increased). Anemia has worsened due to critical illness as well. She has required 3 units of PRBC so far, she has been started on vitamin B12 and iron supplementation. Trend hemoglobin periodically. No signs of ongoing GI blood loss, and oral  PPI.  Minimally elevated Troponin level: Likely secondary to  demand ischemia-doubt ACS. Given issues with malignancy. severity of anemia-doubt further workup needed at this time. Echo showed preserved EF without any wall motion abnormalities  Left arm ecchymosis: Occurred when IV infiltrated when she was receiving of PRBC transfusion. This appears stable-ecchymosis mostly appears to be very superficial.  Thrombocytosis: Likely reactive-due to underlying malignancy/chronic inflammation.  Moderate protein calorie malnutrition: Seen by nutrition, continue supplements.  Scalp mass: Per patient is has been there for the past 12 years-see pictures below. I do not think this is related to her presentation. She will likely require a biopsy-at some point-probably as an outpatient. See pictures below.   DVT Prophylaxis: Prophylactic Lovenox   Code Status: Full code   Family Communication: None at bedside   Disposition Plan:  Tele  Antimicrobial agents: Anti-infectives    Start     Dose/Rate Route Frequency Ordered Stop   04/28/16 0500  vancomycin (VANCOCIN) 1,500 mg in sodium chloride 0.9 % 500 mL IVPB  Status:  Discontinued     1,500 mg 250 mL/hr over 120 Minutes Intravenous Every 24 hours 04/27/16 0908 04/27/16 1544   04/27/16 1700  ceFAZolin (ANCEF) IVPB 1 g/50 mL premix  Status:  Discontinued     1 g 100 mL/hr over 30 Minutes Intravenous Every 8 hours 04/27/16 1544 04/27/16 1548   04/27/16 1700  ceFAZolin (ANCEF) IVPB 2g/100 mL premix     2 g 200 mL/hr over 30 Minutes Intravenous Every 8 hours 04/27/16 1548     04/26/16 1700  vancomycin (VANCOCIN) 1,250 mg in sodium chloride 0.9 % 250 mL IVPB  Status:  Discontinued     1,250 mg 166.7 mL/hr over 90 Minutes Intravenous Every 12 hours 04/26/16 0908 04/27/16 0908   04/26/16 0200  piperacillin-tazobactam (ZOSYN) IVPB 3.375 g  Status:  Discontinued     3.375 g 12.5 mL/hr over 240 Minutes Intravenous Every 8 hours 04/25/16 1827 04/27/16 1544   04/25/16 2300  vancomycin (VANCOCIN) IVPB 1000 mg/200  mL premix  Status:  Discontinued     1,000 mg 200 mL/hr over 60 Minutes Intravenous Every 8 hours 04/25/16 2247 04/26/16 0908   04/25/16 1745  piperacillin-tazobactam (ZOSYN) IVPB 3.375 g     3.375 g 100 mL/hr over 30 Minutes Intravenous  Once 04/25/16 1738 04/25/16 1854      Procedures:  Bilateral lower extremity venous duplex completed.  Preliminary report:  There is no DVT noted in the bilateral lower extremities.  There is superficial thrombosis noted in the left small saphenous vein.  There is a small Baker's cyst noted in the right popliteal fossa.   US paracentesis - Successful ultrasound-guided diagnostic and therapeutic paracentesis yielding 530 liters of peritoneal fluid.  CT guided - omental biopsy 05-02-16 -    CONSULTS:  GYN Oncology  Time spent: 30-minutes-Greater than 50% of this time was spent in counseling, explanation of diagnosis, planning of further management, and coordination of care.  MEDICATIONS: Scheduled Meds: . enoxaparin (LOVENOX) injection  1.5 mg/kg Subcutaneous Q24H  . ferrous sulfate  325 mg Oral BID WC  . furosemide  40 mg Intravenous BID  . metoprolol tartrate  50 mg Oral BID  . multivitamin with minerals  1 tablet Oral Daily  . pantoprazole  40 mg Oral Daily  . polyethylene glycol  17 g Oral Daily  . potassium chloride  20 mEq Oral Daily  . protein supplement shake  11 oz Oral TID BM  . sodium chloride  1 spray Each Nare QID  . spironolactone  25 mg Oral Daily  . vitamin B-12  1,000 mcg Oral Daily   Continuous Infusions: .  ceFAZolin (ANCEF) IV Stopped (05/04/16 0140)   PRN Meds:.acetaminophen **OR** [DISCONTINUED] acetaminophen, metoprolol, oxyCODONE, promethazine   PHYSICAL EXAM:  Vital signs: Vitals:   05/03/16 1345 05/03/16 1411 05/03/16 2123 05/04/16 0515  BP: (!) 97/51 (!) 96/54 (!) 124/54 (!) 122/57  Pulse: 73  74 66  Resp:   16 16  Temp: 97.5 F (36.4 C)  97.7 F (36.5 C) 98.3 F (36.8 C)  TempSrc: Oral  Oral Oral    SpO2: 100%  98% 95%  Weight:      Height:       Filed Weights   05/01/16 0823 05/02/16 0500 05/03/16 0457  Weight: 91.5 kg (201 lb 11.2 oz) 93.1 kg (205 lb 4 oz) 93.3 kg (205 lb 9.6 oz)   Body mass index is 34.21 kg/m.   Awake Alert, Oriented X 3, No new F.N deficits, Normal affect [place].AT,PERRAL Supple Neck,No JVD, No cervical lymphadenopathy appriciated.  Symmetrical Chest wall movement, Good air movement bilaterally, CTAB RRR,No Gallops,Rubs or new Murmurs, No Parasternal Heave +ve B.Sounds, Abd mildly distended, No tenderness, No organomegaly appriciated, No rebound - guarding or rigidity. No Cyanosis, Clubbing or edema, No new Rash or bruise   stable L arm bruise  Note picture taken on 4/27 after patient's permission.     I have personally reviewed following labs and imaging studies  LABORATORY DATA: CBC:  Recent Labs Lab 04/28/16 0414  04/30/16 0247 05/01/16 0248 05/02/16 0427 05/03/16 0557 05/04/16 0548  WBC 27.1*  < > 23.4* 23.1* 21.6* 17.4* 17.9*  NEUTROABS 26.0*  --   --   --  19.4*  --   --   HGB 7.1*  < > 6.9* 8.2* 7.9* 7.9* 8.2*  HCT 24.8*  < > 25.1* 27.4* 28.3* 26.2* 28.0*  MCV 66.5*  < > 66.1* 67.7* 68.9* 68.6* 69.5*  PLT 366  < > 365 364 375 348 357  < > = values in this interval not displayed.  Basic Metabolic Panel:  Recent Labs Lab 04/30/16 0247 05/01/16 0248 05/02/16 0427 05/03/16 0557 05/04/16 0548  NA 131* 133* 135 134* 134*  K 4.1 3.5 3.7 3.7 3.7  CL 100* 100* 102 99* 100*  CO2 22 23 27 25 27   GLUCOSE 127* 107* 116* 108* 103*  BUN 29* 26* 25* 22* 21*  CREATININE 0.88 0.73 0.68 0.62 0.61  CALCIUM 7.8* 7.9* 7.9* 7.8* 7.9*    GFR: Estimated Creatinine Clearance: 84.4 mL/min (by C-G formula based on SCr of 0.61 mg/dL).  Liver Function Tests:  Recent Labs Lab 04/28/16 0414 04/29/16 0219 05/01/16 0248 05/02/16 0427  AST 14* 13* 15 26  ALT 10* 7* 6* 7*  ALKPHOS 78 84 100 84  BILITOT 1.0 0.6 0.6 0.2*  PROT 5.2* 5.3* 5.4*  5.3*  ALBUMIN 1.9* 1.8* 1.9* 1.8*   No results for input(s): LIPASE, AMYLASE in the last 168 hours. No results for input(s): AMMONIA in the last 168 hours.  Coagulation Profile:  Recent Labs Lab 05/01/16 0958  INR 1.14    Cardiac Enzymes: No results for input(s): CKTOTAL, CKMB, CKMBINDEX, TROPONINI in the last 168 hours.  BNP (last 3 results) No results for input(s): PROBNP in the last 8760 hours.  HbA1C: No results for input(s): HGBA1C in the last 72 hours.  CBG:  Recent Labs Lab 05/01/16 [phone] 05/02/16 0810 05/02/16 2028  05/03/16 0755 05/04/16 0752  GLUCAP 111* 103* 145* 104* 89    Lipid Profile: No results for input(s): CHOL, HDL, LDLCALC, TRIG, CHOLHDL, LDLDIRECT in the last 72 hours.  Thyroid Function Tests: No results for input(s): TSH, T4TOTAL, FREET4, T3FREE, THYROIDAB in the last 72 hours.  Anemia Panel: No results for input(s): VITAMINB12, FOLATE, FERRITIN, TIBC, IRON, RETICCTPCT in the last 72 hours.  Urine analysis:    Component Value Date/Time   COLORURINE AMBER (A) 04/26/2016 0530   APPEARANCEUR CLEAR 04/26/2016 0530   LABSPEC >1.046 (H) 04/26/2016 0530   PHURINE 5.0 04/26/2016 0530   GLUCOSEU NEGATIVE 04/26/2016 0530   HGBUR NEGATIVE 04/26/2016 0530   BILIRUBINUR NEGATIVE 04/26/2016 0530   KETONESUR NEGATIVE 04/26/2016 0530   PROTEINUR NEGATIVE 04/26/2016 0530   NITRITE NEGATIVE 04/26/2016 0530   LEUKOCYTESUR NEGATIVE 04/26/2016 0530    Sepsis Labs: Lactic Acid, Venous    Component Value Date/Time   LATICACIDVEN 1.4 04/27/2016 0309    MICROBIOLOGY: Recent Results (from the past 240 hour(s))  Blood Culture (routine x 2)     Status: None   Collection Time: 04/25/16  5:54 PM  Result Value Ref Range Status   Specimen Description BLOOD RIGHT WRIST  Final   Special Requests IN PEDIATRIC BOTTLE Blood Culture adequate volume  Final   Culture NO GROWTH 5 DAYS  Final   Report Status 04/30/2016 FINAL  Final  Blood Culture (routine x 2)      Status: Abnormal   Collection Time: 04/25/16  5:58 PM  Result Value Ref Range Status   Specimen Description BLOOD RIGHT ANTECUBITAL  Final   Special Requests   Final    BOTTLES DRAWN AEROBIC AND ANAEROBIC Blood Culture adequate volume   Culture  Setup Time   Final    GRAM POSITIVE COCCI IN CLUSTERS AEROBIC BOTTLE ONLY Organism ID to follow CRITICAL RESULT CALLED TO, READ BACK BY AND VERIFIED WITH: AElta [place].D. 12:00 04/27/16 ([email or web address])    Culture STAPHYLOCOCCUS AUREUS (A)  Final   Report Status 04/29/2016 FINAL  Final   Organism ID, Bacteria STAPHYLOCOCCUS AUREUS  Final      Susceptibility   Staphylococcus aureus - MIC*    CIPROFLOXACIN <=0.5 SENSITIVE Sensitive     ERYTHROMYCIN <=0.25 SENSITIVE Sensitive     GENTAMICIN <=0.5 SENSITIVE Sensitive     OXACILLIN <=0.25 SENSITIVE Sensitive     TETRACYCLINE <=1 SENSITIVE Sensitive     VANCOMYCIN 1 SENSITIVE Sensitive     TRIMETH/SULFA <=10 SENSITIVE Sensitive     CLINDAMYCIN <=0.25 SENSITIVE Sensitive     RIFAMPIN <=0.5 SENSITIVE Sensitive     Inducible Clindamycin NEGATIVE Sensitive     * STAPHYLOCOCCUS AUREUS  Blood Culture ID Panel (Reflexed)     Status: Abnormal   Collection Time: 04/25/16  5:58 PM  Result Value Ref Range Status   Enterococcus species NOT DETECTED NOT DETECTED Final   Listeria monocytogenes NOT DETECTED NOT DETECTED Final   Staphylococcus species DETECTED (A) NOT DETECTED Final    Comment: CRITICAL RESULT CALLED TO, READ BACK BY AND VERIFIED WITH: AElta [place].D. 12:00 04/27/16 ([email or web address])    Staphylococcus aureus DETECTED (A) NOT DETECTED Final    Comment: Methicillin (oxacillin) susceptible Staphylococcus aureus (MSSA). Preferred therapy is anti staphylococcal beta lactam antibiotic (Cefazolin or Nafcillin), unless clinically contraindicated. CRITICAL RESULT CALLED TO, READ BACK BY AND VERIFIED WITH: AElta [place].D. 12:00 04/27/16 ([email or web address])    Methicillin resistance NOT DETECTED NOT DETECTED  Final   Streptococcus species NOT DETECTED NOT  DETECTED Final   Streptococcus agalactiae NOT DETECTED NOT DETECTED Final   Streptococcus pneumoniae NOT DETECTED NOT DETECTED Final   Streptococcus pyogenes NOT DETECTED NOT DETECTED Final   Acinetobacter baumannii NOT DETECTED NOT DETECTED Final   Enterobacteriaceae species NOT DETECTED NOT DETECTED Final   Enterobacter cloacae complex NOT DETECTED NOT DETECTED Final   Escherichia coli NOT DETECTED NOT DETECTED Final   Klebsiella oxytoca NOT DETECTED NOT DETECTED Final   Klebsiella pneumoniae NOT DETECTED NOT DETECTED Final   Proteus species NOT DETECTED NOT DETECTED Final   Serratia marcescens NOT DETECTED NOT DETECTED Final   Haemophilus influenzae NOT DETECTED NOT DETECTED Final   Neisseria meningitidis NOT DETECTED NOT DETECTED Final   Pseudomonas aeruginosa NOT DETECTED NOT DETECTED Final   Candida albicans NOT DETECTED NOT DETECTED Final   Candida glabrata NOT DETECTED NOT DETECTED Final   Candida krusei NOT DETECTED NOT DETECTED Final   Candida parapsilosis NOT DETECTED NOT DETECTED Final   Candida tropicalis NOT DETECTED NOT DETECTED Final  MRSA PCR Screening     Status: None   Collection Time: 04/25/16 10:25 PM  Result Value Ref Range Status   MRSA by PCR NEGATIVE NEGATIVE Final    Comment:        The GeneXpert MRSA Assay (FDA approved for NASAL specimens only), is one component of a comprehensive MRSA colonization surveillance program. It is not intended to diagnose MRSA infection nor to guide or monitor treatment for MRSA infections.   Urine culture     Status: None   Collection Time: 04/26/16  5:30 AM  Result Value Ref Range Status   Specimen Description URINE, RANDOM  Final   Special Requests NONE  Final   Culture NO GROWTH  Final   Report Status 04/27/2016 FINAL  Final  Culture, body fluid-bottle     Status: Abnormal   Collection Time: 04/26/16  1:36 PM  Result Value Ref Range Status   Specimen Description  FLUID  Final   Special Requests PERITONEAL  Final   Gram Stain   Final    GRAM POSITIVE COCCI IN CLUSTERS ANAEROBIC BOTTLE ONLY CRITICAL RESULT CALLED TO, READ BACK BY AND VERIFIED WITH: C. STATEN RN, AT 1347 04/30/16 BY D. VANHOOK    Culture STAPHYLOCOCCUS AUREUS (A)  Final   Report Status 05/02/2016 FINAL  Final   Organism ID, Bacteria STAPHYLOCOCCUS AUREUS  Final      Susceptibility   Staphylococcus aureus - MIC*    CIPROFLOXACIN <=0.5 SENSITIVE Sensitive     ERYTHROMYCIN <=0.25 SENSITIVE Sensitive     GENTAMICIN <=0.5 SENSITIVE Sensitive     OXACILLIN <=0.25 SENSITIVE Sensitive     TETRACYCLINE <=1 SENSITIVE Sensitive     VANCOMYCIN 1 SENSITIVE Sensitive     TRIMETH/SULFA <=10 SENSITIVE Sensitive     CLINDAMYCIN <=0.25 SENSITIVE Sensitive     RIFAMPIN <=0.5 SENSITIVE Sensitive     Inducible Clindamycin NEGATIVE Sensitive     * STAPHYLOCOCCUS AUREUS  Gram stain     Status: None   Collection Time: 04/26/16  1:36 PM  Result Value Ref Range Status   Specimen Description FLUID  Final   Special Requests PERITONEAL  Final   Gram Stain   Final    WBC PRESENT, PREDOMINANTLY PMN GRAM POSITIVE COCCI IN CLUSTERS IN PAIRS    Report Status 04/26/2016 FINAL  Final  Culture, blood (Routine X 2) w Reflex to ID Panel     Status: None   Collection Time: 04/28/16  4:14 AM  Result Value Ref Range Status   Specimen Description BLOOD LEFT ARM  Final   Special Requests AEROBIC BOTTLE ONLY Blood Culture adequate volume  Final   Culture NO GROWTH 5 DAYS  Final   Report Status 05/03/2016 FINAL  Final  Culture, body fluid-bottle     Status: None   Collection Time: 04/28/16 11:40 AM  Result Value Ref Range Status   Specimen Description FLUID PERITONEAL  Final   Special Requests NONE  Final   Culture NO GROWTH 5 DAYS  Final   Report Status 05/03/2016 FINAL  Final  Gram stain     Status: None   Collection Time: 04/28/16 11:40 AM  Result Value Ref Range Status   Specimen Description FLUID  PERITONEAL  Final   Special Requests NONE  Final   Gram Stain   Final    ABUNDANT WBC PRESENT,BOTH PMN AND MONONUCLEAR NO ORGANISMS SEEN    Report Status 04/29/2016 FINAL  Final    RADIOLOGY STUDIES/RESULTS: Ct Angio Chest Pe W Or Wo Contrast  Result Date: 04/27/2016 CLINICAL DATA:  61 year old female with history of ovarian cancer presenting with tachycardia. EXAM: CT ANGIOGRAPHY CHEST WITH CONTRAST TECHNIQUE: Multidetector CT imaging of the chest was performed using the standard protocol during bolus administration of intravenous contrast. Multiplanar CT image reconstructions and MIPs were obtained to evaluate the vascular anatomy. CONTRAST:  100 cc Isovue 370 COMPARISON:  Chest radiograph dated 04/25/2016 and CT of the abdomen pelvis dated 04/25/2016 FINDINGS: Cardiovascular: There is mild cardiomegaly. No pericardial effusion. There is coronary vascular calcification involving the LAD. The thoracic aorta appears unremarkable. The origins of the great vessels of the aortic arch appear patent. There is dilatation of the main pulmonary trunk suggestive of underlying pulmonary hypertension. Evaluation of the pulmonary arteries is limited due to suboptimal opacification of the peripheral branches. No central pulmonary artery embolus identified. Mediastinum/Nodes: There is no hilar or mediastinal adenopathy. The esophagus and the thyroid gland are grossly unremarkable. Lungs/Pleura: There is a small left pleural effusion. There are consolidative changes of the left lower lobe as well as scattered densities in the left upper lobe. Overall there is volume loss in the left lung with shift of the heart into the left hemithorax. There is segmental air trapping in the right middle lobe with diffuse hazy airspace density in the right upper and right lower lobes. Paramediastinal subpleural density in the right upper lobe likely represents atelectatic changes or pleural thickening. There is no pneumothorax. Upper  Abdomen: Ascites is partially visualized. Musculoskeletal: Multilevel degenerative changes of the spine. No acute osseous pathology. Review of the MIP images confirms the above findings. IMPRESSION: 1. No CT evidence of pulmonary artery embolus. 2. Areas of consolidative change involving the left lower lobe as well as scattered pulmonary densities in the left upper lobe. Findings may represent pneumonia. Correlation with clinical exam recommended. 3. Overall decreased left lung volume with leftward shift of the mediastinum and heart. 4. Segmental air trapping in the right middle lobe with diffuse haziness of the right upper and right lower lobes. 5. Ascites. Electronically Signed   By: Anner Crete M.D.   On: 04/27/2016 05:40   Ct Abdomen Pelvis W Contrast  Result Date: 04/25/2016 CLINICAL DATA:  61 year old female with history of abdominal distension and nausea. No recent vomiting. EXAM: CT ABDOMEN AND PELVIS WITH CONTRAST TECHNIQUE: Multidetector CT imaging of the abdomen and pelvis was performed using the standard protocol following bolus administration of intravenous contrast. CONTRAST:  1 ISOVUE-300 IOPAMIDOL (  ISOVUE-300) INJECTION 61% COMPARISON:  No priors. FINDINGS: Lower chest: Air trapping right middle lobe. Small left pleural effusion. Atherosclerotic calcifications in the left anterior descending and left circumflex coronary arteries. Hepatobiliary: No suspicious cystic or solid hepatic lesions. No intra or extrahepatic biliary ductal dilatation. Small calcified gallstones lying dependently in the gallbladder. No findings to suggest an acute cholecystitis at this time. Pancreas: No pancreatic mass. No pancreatic ductal dilatation. No pancreatic or peripancreatic fluid or inflammatory changes. Spleen: Unremarkable. Adrenals/Urinary Tract: Right kidney and bilateral adrenal glands are normal in appearance. 1.9 cm simple cyst in the interpolar region of the left kidney. No hydroureteronephrosis.  Urinary bladder is normal in appearance. Stomach/Bowel: The appearance of the stomach is normal. There is no pathologic dilatation of small bowel or colon. Appendix is not confidently visualized. Vascular/Lymphatic: No significant atherosclerotic disease, aneurysm or dissection identified in the abdominal or pelvic vasculature. No lymphadenopathy noted in the abdomen or pelvis. Reproductive: Large complex mixed cystic and solid mass which emanates from one or both of the adnexal regions, highly concerning for ovarian neoplasm. This measures approximately 20.5 x 23.1 x 24.6 cm (coronal image 46 of series 6 and sagittal image 65 of series 7), and has mixed cystic and solid components, with some heterogeneous internal calcification. Uterus is also heterogeneous in appearance, with multiple densely calcified lesions, presumably multiple fibroids, largest of which measures 5.5 cm in the anterior aspect of the fundal region. Other: Large volume of ascites. Extensive nodular soft tissue thickening throughout the omentum, compatible with omental caking. Some of this extends through the umbilical region. No pneumoperitoneum. Musculoskeletal: There are no aggressive appearing lytic or blastic lesions noted in the visualized portions of the skeleton. IMPRESSION: 1. 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 Consultation with Gynecology is recommended. 2. Small left pleural effusion. 3. Aortic atherosclerosis, in addition to 2 vessel coronary artery disease. Please note that although the presence of coronary artery calcium documents the presence of coronary artery disease, the severity of this disease and any potential stenosis cannot be assessed on this non-gated CT examination. Assessment for potential risk factor modification, dietary therapy or pharmacologic therapy may be warranted, if clinically indicated. 4. Cholelithiasis.  Electronically Signed   By: Vinnie Langton M.D.   On: 04/25/2016 18:59   US Paracentesis  Result Date: 04/26/2016 INDICATION: Patient with abdominal pain and large complex solid/ cystic adnexal mass, ascites, omental thickening concerning for ovarian cancer; request received for diagnostic and therapeutic paracentesis. EXAM: ULTRASOUND GUIDED DIAGNOSTIC AND THERAPEUTIC PARACENTESIS MEDICATIONS: None. COMPLICATIONS: None immediate. PROCEDURE: Informed written consent was obtained from the patient after a discussion of the risks, benefits and alternatives to treatment. A timeout was performed prior to the initiation of the procedure. Initial ultrasound scanning demonstrates a moderate amount of ascites within the left mid to lower abdominal quadrant. The left mid to lower abdomen was prepped and draped in the usual sterile fashion. 1% lidocaine was used for local anesthesia. Following this, a Yueh catheter was introduced. An ultrasound image was saved for documentation purposes. The paracentesis was performed. The catheter was removed and a dressing was applied. The patient tolerated the procedure well without immediate post procedural complication. FINDINGS: A total of approximately 2.5 liters of turbid, bloody fluid was removed. Samples were sent to the laboratory as requested by the clinical team. IMPRESSION: Successful ultrasound-guided diagnostic and therapeutic paracentesis yielding 2.5 liters of peritoneal fluid. Read by: Rowe Robert, PA-C Electronically Signed  By: Corrie Mckusick D.O.   On: 04/26/2016 13:58   Ct Biopsy  Result Date: 05/01/2016 INDICATION: LARGE ABDOMINOPELVIC COMPLEX CYSTIC MASS INVOLVING THE OMENTUM EXAM: CT-GUIDED BIOPSY ANTERIOR OMENTAL MASS MEDICATIONS: 1% LIDOCAINE LOCALLY ANESTHESIA/SEDATION: 1.0 mg IV Versed; 25 mcg IV Fentanyl Moderate Sedation Time:  13 minutes The patient was continuously monitored during the procedure by the interventional radiology nurse under my direct  supervision. PROCEDURE: The procedure, risks, benefits, and alternatives were explained to the patient. Questions regarding the procedure were encouraged and answered. The patient understands and consents to the procedure. Previous imaging reviewed. Patient positioned supine. Noncontrast localization CT performed. The large abdominopelvic mass involving the omentum was localized. Omental component was marked in the anterior abdomen just above the umbilicus. Under sterile conditions and local anesthesia, a 17 gauge coaxial guide needle was advanced into the anterior omental mass. Needle position confirmed with CT. 18 gauge core biopsies obtained. These were placed in formalin. Patient tolerated the procedure well without complication. Vital sign monitoring by nursing staff during the procedure will continue as patient is in the special procedures unit for post procedure observation. FINDINGS: The images document guide needle placement within the anterior omental mass. Post biopsy images demonstrate no hemorrhage or hematoma. COMPLICATIONS: None immediate. IMPRESSION: Successful CT-guided anterior omental mass 18 gauge core biopsy Electronically Signed   By: Jerilynn Mages.  Shick M.D.   On: 05/01/2016 15:17   Dg Chest Port 1 View  Result Date: 04/25/2016 CLINICAL DATA:  Abdominal distension with fever EXAM: PORTABLE CHEST 1 VIEW COMPARISON:  None. FINDINGS: Low lung volumes. Non inclusion of the left CP angle. Borderline to mild cardiomegaly, exaggerated by low lung volume. Mild atherosclerosis. No focal consolidation. No pneumothorax. IMPRESSION: Low lung volumes, without infiltrate or edema. Borderline to mild cardiomegaly. Electronically Signed   By: Donavan Foil M.D.   On: 04/25/2016 17:54   Ir Paracentesis  Result Date: 04/28/2016 INDICATION: Patient with ascites. Request is made for diagnostic and therapeutic paracentesis. EXAM: ULTRASOUND GUIDED DIAGNOSTIC AND THERAPEUTIC PARACENTESIS MEDICATIONS: 10 mL 1%  lidocaine COMPLICATIONS: None immediate. PROCEDURE: Informed written consent was obtained from the patient after a discussion of the risks, benefits and alternatives to treatment. A timeout was performed prior to the initiation of the procedure. Initial ultrasound scanning demonstrates a large amount of ascites within the right lateral abdomen. The right lateral abdomen was prepped and draped in the usual sterile fashion. 1% lidocaine was used for local anesthesia. Following this, a 19 gauge, 7-cm, Yueh catheter was introduced. An ultrasound image was saved for documentation purposes. The paracentesis was performed. The catheter was removed and a dressing was applied. The patient tolerated the procedure well without immediate post procedural complication. FINDINGS: A total of approximately 530 mL of thick, amber fluid was removed. Samples were sent to the laboratory as requested by the clinical team. IMPRESSION: Successful ultrasound-guided diagnostic and therapeutic paracentesis yielding 530 liters of peritoneal fluid. Read by:  Brynda Greathouse PA-C Electronically Signed   By: Corrie Mckusick D.O.   On: 04/28/2016 13:59     LOS: 9 days   Signature  Lala Lund M.D on 05/04/2016 at 9:08 AM  Between 7am to 7pm - Pager - [phone] ( page via [email or web address], text pages only, please mention full 10 digit call back number).  After 7pm go to [email or web address] - password [facility]

## 2016-05-04 NOTE — Progress Notes (Signed)
Physical Therapy Treatment Patient Details Name: Allison Haynes MRN: [id number] DOB: 03-22-1955 Today's Date: 05/04/2016    History of Present Illness Allison Haynes is a 61 y.o. Female with no known medical problems prior to this hospital admission who presented to [facility] with severe abdominal pain and distention on 04/25/16.  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    Allison Haynes Comments    Allison Haynes improving functional mobility, goals upgraded to mod I. Allison Haynes able to gait with supervision 150' today with vital signs stable.  Allison Haynes tolerated treatment well.  Follow Up Recommendations  Home health Allison Haynes     Equipment Recommendations  Rolling walker with 5" wheels    Recommendations for Other Services       Precautions / Restrictions Precautions Precautions: Fall Restrictions Weight Bearing Restrictions: No    Mobility  Bed Mobility                  Transfers   Equipment used: Rolling walker (2 wheeled) Transfers: Sit to/from Stand Sit to Stand: Supervision         General transfer comment: increased time, good technique, supervision for safety  Ambulation/Gait Ambulation/Gait assistance: Supervision Ambulation Distance (Feet): 150 Feet Assistive device: Rolling walker (2 wheeled)   Gait velocity: decreased   General Gait Details: no LOB, decreased cadence and flexed posture due to abdominal tightness   Stairs            Wheelchair Mobility    Modified Rankin (Stroke Patients Only)       Balance     Sitting balance-Leahy Scale: Good       Standing balance-Leahy Scale: Fair Standing balance comment: requires RW for balance                            Cognition Arousal/Alertness: Awake/alert Behavior During Therapy: WFL for tasks  assessed/performed Overall Cognitive Status: Within Functional Limits for tasks assessed                                        Exercises      General Comments        Pertinent Vitals/Pain Pain Score: 3  Pain Location: stomach Pain Descriptors / Indicators: Sore Pain Intervention(s): Limited activity within patient's tolerance;Monitored during session    Home Living                      Prior Function            Allison Haynes Goals (current goals can now be found in the care plan section) Acute Rehab Allison Haynes Goals Patient Stated Goal: To move around Allison Haynes Goal Formulation: With patient Time For Goal Achievement: 05/11/16 Potential to Achieve Goals: Fair Progress towards Allison Haynes goals: Progressing toward goals    Frequency    Min 3X/week      Allison Haynes Plan Discharge plan needs to be updated    Co-evaluation              AM-PAC Allison Haynes "6 Clicks" Daily Activity  Outcome Measure  Difficulty turning over in bed (including adjusting bedclothes, sheets and blankets)?: A Little Difficulty moving from lying on back to sitting on the side of the bed? : A Little Difficulty  sitting down on and standing up from a chair with arms (e.g., wheelchair, bedside commode, etc,.)?: A Little Help needed moving to and from a bed to chair (including a wheelchair)?: A Little Help needed walking in hospital room?: A Little Help needed climbing 3-5 steps with a railing? : A Little 6 Click Score: 18    End of Session Equipment Utilized During Treatment: Gait belt Activity Tolerance: Patient tolerated treatment well Patient left: in chair;with call bell/phone within reach Nurse Communication: Mobility status Allison Haynes Visit Diagnosis: Difficulty in walking, not elsewhere classified (R26.2);Muscle weakness (generalized) (M62.81)     Time: 6244-6950 Allison Haynes Time Calculation (min) (ACUTE ONLY): 16 min  Charges:  $Gait Training: 8-22 mins                    G Codes:       Isabelle Course, Allison Haynes,  DPT   Anjel Pardo 05/04/2016, 2:03 PM

## 2016-05-04 NOTE — Progress Notes (Signed)
Nutrition Follow-up  DOCUMENTATION CODES:   Non-severe (moderate) malnutrition in context of chronic illness  INTERVENTION:  Glass blower/designer.  Provide Boost Breeze po TID, each supplement provides 250 kcal and 9 grams of protein.  Encourage adequate PO intake.   NUTRITION DIAGNOSIS:   Malnutrition (moderate) related to cancer and cancer related treatments as evidenced by energy intake < or equal to 75% for > or equal to 1 month, moderate depletions of muscle mass; ongoing  GOAL:   Patient will meet greater than or equal to 90% of their needs; progressing  MONITOR:   PO intake, Supplement acceptance, Labs, I & O's  REASON FOR ASSESSMENT:   Consult Assessment of nutrition requirement/status  ASSESSMENT:   61 y.o. Female with no known medical problems prior to this hospital admission who presented to [facility] with severe abdominal pain and distention on 04/25/16. She reported a history of intermittent abdominal discomfort and distention for about a month prior to presentation. 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 The patient was admitted for pain control and evaluation by Gyn Onc. She underwent paracentesis on 4/22 and gram stain reveals gram positive cocci in pairs Pt with Sepsis secondary to Spontaneous peritonitis of likely malignant ascitis with MSSA bacteremia and Large complex mixed cystic/solid adnexal mass with probable malignant ascites/omental metastases  Meal completion has been varied from 20-75%. Pt reports having a good appetite. Pt reports she has been trying to order food at meals she prefers to eat. Pt currently has Premier protein shakes ordered and has been refusing them due to dislike of taste. Pt additionally reports she is  lactose intolerant. Pt is agreeable to alternative supplements. RD to order [facility]. Pt encouraged to eat her food at meals and to drink her supplements. Labs and medications reviewed.   Diet Order:  Diet Heart Room service appropriate? Yes; Fluid consistency: Thin  Skin:  Reviewed, no issues  Last BM:  4/29  Height:   Ht Readings from Last 1 Encounters:  04/25/16 5\' 5"  (1.651 m)    Weight:   Wt Readings from Last 1 Encounters:  05/03/16 205 lb 9.6 oz (93.3 kg)    Ideal Body Weight:  56.8 kg  BMI:  Body mass index is 34.21 kg/m.  Estimated Nutritional Needs:   Kcal:  1700-2000kcal/day   Protein:  102-114g/day   Fluid:  >1.7L/day   EDUCATION NEEDS:   No education needs identified at this time  Corrin Parker, MS, RD, LDN Pager # [phone] After hours/ weekend pager # [phone]

## 2016-05-05 ENCOUNTER — Other Ambulatory Visit: Payer: Self-pay | Admitting: Hematology and Oncology

## 2016-05-05 DIAGNOSIS — C786 Secondary malignant neoplasm of retroperitoneum and peritoneum: Secondary | ICD-10-CM

## 2016-05-05 DIAGNOSIS — D519 Vitamin B12 deficiency anemia, unspecified: Secondary | ICD-10-CM

## 2016-05-05 DIAGNOSIS — A412 Sepsis due to unspecified staphylococcus: Secondary | ICD-10-CM

## 2016-05-05 DIAGNOSIS — D509 Iron deficiency anemia, unspecified: Secondary | ICD-10-CM | POA: Insufficient documentation

## 2016-05-05 DIAGNOSIS — C801 Malignant (primary) neoplasm, unspecified: Secondary | ICD-10-CM

## 2016-05-05 DIAGNOSIS — Z888 Allergy status to other drugs, medicaments and biological substances status: Secondary | ICD-10-CM

## 2016-05-05 DIAGNOSIS — C569 Malignant neoplasm of unspecified ovary: Secondary | ICD-10-CM

## 2016-05-05 LAB — GLUCOSE, CAPILLARY: Glucose-Capillary: 98 mg/dL (ref 65–99)

## 2016-05-05 LAB — BASIC METABOLIC PANEL
Anion gap: 6 (ref 5–15)
BUN: 20 mg/dL (ref 6–20)
CO2: 28 mmol/L (ref 22–32)
CREATININE: 0.63 mg/dL (ref 0.44–1.00)
Calcium: 7.8 mg/dL — ABNORMAL LOW (ref 8.9–10.3)
Chloride: 99 mmol/L — ABNORMAL LOW (ref 101–111)
GFR calc Af Amer: >60 mL/min (ref >60)
Glucose, Bld: 100 mg/dL — ABNORMAL HIGH (ref 65–99)
POTASSIUM: 3.7 mmol/L (ref 3.5–5.1)
SODIUM: 133 mmol/L — AB (ref 135–145)

## 2016-05-05 LAB — MAGNESIUM: MAGNESIUM: 1.9 mg/dL (ref 1.7–2.4)

## 2016-05-05 MED ORDER — ENOXAPARIN SODIUM 150 MG/ML ~~LOC~~ SOLN
140.0000 mg | SUBCUTANEOUS | Status: DC
  Administered 2016-05-05: 140 mg via SUBCUTANEOUS
  Filled 2016-05-05: qty 0.93

## 2016-05-05 MED ORDER — SODIUM CHLORIDE 0.9% FLUSH
10.0000 mL | INTRAVENOUS | Status: DC | PRN
  Administered 2016-05-06: 10 mL
  Filled 2016-05-05: qty 40

## 2016-05-05 MED ORDER — ENOXAPARIN SODIUM 40 MG/0.4ML ~~LOC~~ SOLN
40.0000 mg | SUBCUTANEOUS | Status: DC
  Administered 2016-05-06: 40 mg via SUBCUTANEOUS
  Filled 2016-05-05: qty 0.4

## 2016-05-05 NOTE — Progress Notes (Addendum)
CSW discussed pt case with supervisor- approved for 2 weeks LOG to finish IV ancef (2 weeks more per ID note)  CSW initiated search for LOG facility- only facility in [place] able to accept is [facility]- pt dtr informed that this will be only local option- facility can accept patient tomorrow  CSW will continue to follow  Jorge Ny, [place] Social Worker [phone]

## 2016-05-05 NOTE — Progress Notes (Signed)
INFECTIOUS DISEASE PROGRESS NOTE  ID: Allison Haynes is a 61 y.o. female with  Principal Problem:   Sepsis ([place]) Active Problems:   Ovarian cancer ([place])   Malignant ascites   Microcytic anemia   Thrombocytosis (HCC)   Hyponatremia   Severe malnutrition (HCC)   Malnutrition of moderate degree   Omental metastasis (HCC)  Subjective: Without complaints.   Abtx:  Anti-infectives    Start     Dose/Rate Route Frequency Ordered Stop   04/28/16 0500  vancomycin (VANCOCIN) 1,500 mg in sodium chloride 0.9 % 500 mL IVPB  Status:  Discontinued     1,500 mg 250 mL/hr over 120 Minutes Intravenous Every 24 hours 04/27/16 0908 04/27/16 1544   04/27/16 1700  ceFAZolin (ANCEF) IVPB 1 g/50 mL premix  Status:  Discontinued     1 g 100 mL/hr over 30 Minutes Intravenous Every 8 hours 04/27/16 1544 04/27/16 1548   04/27/16 1700  ceFAZolin (ANCEF) IVPB 2g/100 mL premix     2 g 200 mL/hr over 30 Minutes Intravenous Every 8 hours 04/27/16 1548     04/26/16 1700  vancomycin (VANCOCIN) 1,250 mg in sodium chloride 0.9 % 250 mL IVPB  Status:  Discontinued     1,250 mg 166.7 mL/hr over 90 Minutes Intravenous Every 12 hours 04/26/16 0908 04/27/16 0908   04/26/16 0200  piperacillin-tazobactam (ZOSYN) IVPB 3.375 g  Status:  Discontinued     3.375 g 12.5 mL/hr over 240 Minutes Intravenous Every 8 hours 04/25/16 1827 04/27/16 1544   04/25/16 2300  vancomycin (VANCOCIN) IVPB 1000 mg/200 mL premix  Status:  Discontinued     1,000 mg 200 mL/hr over 60 Minutes Intravenous Every 8 hours 04/25/16 2247 04/26/16 0908   04/25/16 1745  piperacillin-tazobactam (ZOSYN) IVPB 3.375 g     3.375 g 100 mL/hr over 30 Minutes Intravenous  Once 04/25/16 1738 04/25/16 1854      Medications:  Scheduled: . enoxaparin (LOVENOX) injection  140 mg Subcutaneous Q24H  . feeding supplement  1 Container Oral TID BM  . ferrous sulfate  325 mg Oral BID WC  . furosemide  40 mg Intravenous Daily  . metoprolol tartrate  25 mg Oral BID    . multivitamin with minerals  1 tablet Oral Daily  . pantoprazole  40 mg Oral Daily  . polyethylene glycol  17 g Oral Daily  . potassium chloride  20 mEq Oral Daily  . sodium chloride  1 spray Each Nare QID  . spironolactone  25 mg Oral Daily  . vitamin B-12  1,000 mcg Oral Daily    Objective: Vital signs in last 24 hours: Temp:  [97.4 F (36.3 C)-98.8 F (37.1 C)] 98 F (36.7 C) (05/01 0511) Pulse Rate:  [73-84] 78 (05/01 0511) Resp:  [18] 18 (05/01 0511) BP: (105-154)/(47-60) 154/47 (05/01 0511) SpO2:  [97 %-100 %] 98 % (05/01 0511) Weight:  [93.4 kg (206 lb)] 93.4 kg (206 lb) (05/01 0511)   General appearance: alert, cooperative and no distress Resp: clear to auscultation bilaterally Cardio: regular rate and rhythm GI: normal findings: bowel sounds normal and soft, non-tender and abnormal findings:  distended  Lab Results  Recent Labs  05/03/16 0557 05/04/16 0548 05/05/16 0339  WBC 17.4* 17.9*  --   HGB 7.9* 8.2*  --   HCT 26.2* 28.0*  --   NA 134* 134* 133*  K 3.7 3.7 3.7  CL 99* 100* 99*  CO2 25 27 28   BUN 22* 21* 20  CREATININE  0.62 0.61 0.63   Liver Panel No results for input(s): PROT, ALBUMIN, AST, ALT, ALKPHOS, BILITOT, BILIDIR, IBILI in the last 72 hours. Sedimentation Rate No results for input(s): ESRSEDRATE in the last 72 hours. C-Reactive Protein No results for input(s): CRP in the last 72 hours.  Microbiology: Recent Results (from the past 240 hour(s))  Blood Culture (routine x 2)     Status: None   Collection Time: 04/25/16  5:54 PM  Result Value Ref Range Status   Specimen Description BLOOD RIGHT WRIST  Final   Special Requests IN PEDIATRIC BOTTLE Blood Culture adequate volume  Final   Culture NO GROWTH 5 DAYS  Final   Report Status 04/30/2016 FINAL  Final  Blood Culture (routine x 2)     Status: Abnormal   Collection Time: 04/25/16  5:58 PM  Result Value Ref Range Status   Specimen Description BLOOD RIGHT ANTECUBITAL  Final   Special  Requests   Final    BOTTLES DRAWN AEROBIC AND ANAEROBIC Blood Culture adequate volume   Culture  Setup Time   Final    GRAM POSITIVE COCCI IN CLUSTERS AEROBIC BOTTLE ONLY Organism ID to follow CRITICAL RESULT CALLED TO, READ BACK BY AND VERIFIED WITH: AElta Gu[place]oupe.D. 12:00 04/27/16 ([email or web address])    Culture STAPHYLOCOCCUS AUREUS (A)  Final   Report Status 04/29/2016 FINAL  Final   Organism ID, Bacteria STAPHYLOCOCCUS AUREUS  Final      Susceptibility   Staphylococcus aureus - MIC*    CIPROFLOXACIN <=0.5 SENSITIVE Sensitive     ERYTHROMYCIN <=0.25 SENSITIVE Sensitive     GENTAMICIN <=0.5 SENSITIVE Sensitive     OXACILLIN <=0.25 SENSITIVE Sensitive     TETRACYCLINE <=1 SENSITIVE Sensitive     VANCOMYCIN 1 SENSITIVE Sensitive     TRIMETH/SULFA <=10 SENSITIVE Sensitive     CLINDAMYCIN <=0.25 SENSITIVE Sensitive     RIFAMPIN <=0.5 SENSITIVE Sensitive     Inducible Clindamycin NEGATIVE Sensitive     * STAPHYLOCOCCUS AUREUS  Blood Culture ID Panel (Reflexed)     Status: Abnormal   Collection Time: 04/25/16  5:58 PM  Result Value Ref Range Status   Enterococcus species NOT DETECTED NOT DETECTED Final   Listeria monocytogenes NOT DETECTED NOT DETECTED Final   Staphylococcus species DETECTED (A) NOT DETECTED Final    Comment: CRITICAL RESULT CALLED TO, READ BACK BY AND VERIFIED WITH: AElta Gu[place].D. 12:00 04/27/16 ([email or web address])    Staphylococcus aureus DETECTED (A) NOT DETECTED Final    Comment: Methicillin (oxacillin) susceptible Staphylococcus aureus (MSSA). Preferred therapy is anti staphylococcal beta lactam antibiotic (Cefazolin or Nafcillin), unless clinically contraindicated. CRITICAL RESULT CALLED TO, READ BACK BY AND VERIFIED WITH: AElta Gu[place].D. 12:00 04/27/16 ([email or web address])    Methicillin resistance NOT DETECTED NOT DETECTED Final   Streptococcus species NOT DETECTED NOT DETECTED Final   Streptococcus agalactiae NOT DETECTED NOT DETECTED Final   Streptococcus pneumoniae NOT  DETECTED NOT DETECTED Final   Streptococcus pyogenes NOT DETECTED NOT DETECTED Final   Acinetobacter baumannii NOT DETECTED NOT DETECTED Final   Enterobacteriaceae species NOT DETECTED NOT DETECTED Final   Enterobacter cloacae complex NOT DETECTED NOT DETECTED Final   Escherichia coli NOT DETECTED NOT DETECTED Final   Klebsiella oxytoca NOT DETECTED NOT DETECTED Final   Klebsiella pneumoniae NOT DETECTED NOT DETECTED Final   Proteus species NOT DETECTED NOT DETECTED Final   Serratia marcescens NOT DETECTED NOT DETECTED Final   Haemophilus influenzae NOT DETECTED NOT DETECTED Final   Neisseria meningitidis NOT DETECTED NOT  DETECTED Final   Pseudomonas aeruginosa NOT DETECTED NOT DETECTED Final   Candida albicans NOT DETECTED NOT DETECTED Final   Candida glabrata NOT DETECTED NOT DETECTED Final   Candida krusei NOT DETECTED NOT DETECTED Final   Candida parapsilosis NOT DETECTED NOT DETECTED Final   Candida tropicalis NOT DETECTED NOT DETECTED Final  MRSA PCR Screening     Status: None   Collection Time: 04/25/16 10:25 PM  Result Value Ref Range Status   MRSA by PCR NEGATIVE NEGATIVE Final    Comment:        The GeneXpert MRSA Assay (FDA approved for NASAL specimens only), is one component of a comprehensive MRSA colonization surveillance program. It is not intended to diagnose MRSA infection nor to guide or monitor treatment for MRSA infections.   Urine culture     Status: None   Collection Time: 04/26/16  5:30 AM  Result Value Ref Range Status   Specimen Description URINE, RANDOM  Final   Special Requests NONE  Final   Culture NO GROWTH  Final   Report Status 04/27/2016 FINAL  Final  Culture, body fluid-bottle     Status: Abnormal   Collection Time: 04/26/16  1:36 PM  Result Value Ref Range Status   Specimen Description FLUID  Final   Special Requests PERITONEAL  Final   Gram Stain   Final    GRAM POSITIVE COCCI IN CLUSTERS ANAEROBIC BOTTLE ONLY CRITICAL RESULT CALLED  TO, READ BACK BY AND VERIFIED WITH: C. STATEN RN, AT 1347 04/30/16 BY D. VANHOOK    Culture STAPHYLOCOCCUS AUREUS (A)  Final   Report Status 05/02/2016 FINAL  Final   Organism ID, Bacteria STAPHYLOCOCCUS AUREUS  Final      Susceptibility   Staphylococcus aureus - MIC*    CIPROFLOXACIN <=0.5 SENSITIVE Sensitive     ERYTHROMYCIN <=0.25 SENSITIVE Sensitive     GENTAMICIN <=0.5 SENSITIVE Sensitive     OXACILLIN <=0.25 SENSITIVE Sensitive     TETRACYCLINE <=1 SENSITIVE Sensitive     VANCOMYCIN 1 SENSITIVE Sensitive     TRIMETH/SULFA <=10 SENSITIVE Sensitive     CLINDAMYCIN <=0.25 SENSITIVE Sensitive     RIFAMPIN <=0.5 SENSITIVE Sensitive     Inducible Clindamycin NEGATIVE Sensitive     * STAPHYLOCOCCUS AUREUS  Gram stain     Status: None   Collection Time: 04/26/16  1:36 PM  Result Value Ref Range Status   Specimen Description FLUID  Final   Special Requests PERITONEAL  Final   Gram Stain   Final    WBC PRESENT, PREDOMINANTLY PMN GRAM POSITIVE COCCI IN CLUSTERS IN PAIRS    Report Status 04/26/2016 FINAL  Final  Culture, blood (Routine X 2) w Reflex to ID Panel     Status: None   Collection Time: 04/28/16  4:14 AM  Result Value Ref Range Status   Specimen Description BLOOD LEFT ARM  Final   Special Requests AEROBIC BOTTLE ONLY Blood Culture adequate volume  Final   Culture NO GROWTH 5 DAYS  Final   Report Status 05/03/2016 FINAL  Final  Culture, body fluid-bottle     Status: None   Collection Time: 04/28/16 11:40 AM  Result Value Ref Range Status   Specimen Description FLUID PERITONEAL  Final   Special Requests NONE  Final   Culture NO GROWTH 5 DAYS  Final   Report Status 05/03/2016 FINAL  Final  Gram stain     Status: None   Collection Time: 04/28/16 11:40 AM  Result Value  Ref Range Status   Specimen Description FLUID PERITONEAL  Final   Special Requests NONE  Final   Gram Stain   Final    ABUNDANT WBC PRESENT,BOTH PMN AND MONONUCLEAR NO ORGANISMS SEEN    Report Status  04/29/2016 FINAL  Final    Studies/Results: No results found.   Assessment/Plan: MSSA bacteremia, peritonitis Repeat BCx sent 4-24, so far ngtd [facility] ED 60-65% Continue ancef for 21 days Wbc remains elevated Fungal, afb, bacterial Cx not sent from Bx (was sent in formalin).   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 Awaiting further stains.  Tumor markers - CA 125- 227, CEA nl, CA-19-1 nl  Protein Calorie Malnutrition  Anemia HCT/Hgbstable  Planning for SNF I discussed path with pt and her family and let them know that this is preliminary.  GYN-Onc will f/u today.  Would give her 2 addn weeks of ancef after d/c due to complexity of abd.   Total days of antibiotics: 9   Allergies  Allergen Reactions  . Pseudoephedrine Other (See Comments)    "makes me loopy"    Discharge antibiotics: ancef 1g IVPB q8h  Duration: 14 days End Date: 05-19-16  [facility] Care Per Protocol: yes please  Labs weekly while on IV antibiotics: _x_ CBC with differential __ BMP _x_ CMP __ CRP __ ESR __ Vancomycin trough  _x_ Please pull PIC at completion of IV antibiotics __ Please leave PIC in place until doctor has seen patient or been notified  Fax weekly labs to [phone]  Clinic Follow Up Appt: PCP         Bobby Rumpf Infectious Diseases (pager) [phone] [email or web address] 05/05/2016, 12:12 PM  LOS: 10 days

## 2016-05-05 NOTE — Progress Notes (Signed)
Peripherally Inserted Central Catheter/Midline Placement  The IV Nurse has discussed with the patient and/or persons authorized to consent for the patient, the purpose of this procedure and the potential benefits and risks involved with this procedure.  The benefits include less needle sticks, lab draws from the catheter, and the patient may be discharged home with the catheter. Risks include, but not limited to, infection, bleeding, blood clot (thrombus formation), and puncture of an artery; nerve damage and irregular heartbeat and possibility to perform a PICC exchange if needed/ordered by physician.  Alternatives to this procedure were also discussed.  Bard Power PICC patient education guide, fact sheet on infection prevention and patient information card has been provided to patient /or left at bedside.    PICC/Midline Placement Documentation  PICC Single Lumen 05/05/16 PICC Right Brachial 36 cm 0 cm (Active)  Indication for Insertion or Continuance of Line Prolonged intravenous therapies 05/05/2016  6:10 PM  Exposed Catheter (cm) 0 cm 05/05/2016  6:10 PM  Site Assessment Clean;Dry;Intact 05/05/2016  6:10 PM  Line Status Blood return noted;Flushed;Saline locked 05/05/2016  6:10 PM  Dressing Type Transparent 05/05/2016  6:10 PM  Dressing Status Clean;Dry;Intact;Antimicrobial disc in place 05/05/2016  6:10 PM  Dressing Intervention New dressing 05/05/2016  6:10 PM  Dressing Change Due 05/12/16 05/05/2016  6:10 PM       Aldona Lento L 05/05/2016, 6:11 PM

## 2016-05-05 NOTE — NC FL2 (Signed)
[place] LEVEL OF CARE SCREENING TOOL     IDENTIFICATION  Patient Name: Allison Haynes Birthdate: 09-22-1955 Sex: female Admission Date (Current Location): 04/25/2016  [facility] [place] Number:  Herbalist and Address:  [facility] [place], [place] [address] [place], [place] [address]      Provider Number: [id number]  Attending Physician Name and Address:  Thurnell Lose, MD  Relative Name and Phone Number:       Current Level of Care: Hospital Recommended Level of Care: [place] Prior Approval Number:    Date Approved/Denied:   PASRR Number: [id number]  Discharge Plan: SNF    Current Diagnoses: Patient Active Problem List   Diagnosis Date Noted  . 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 Malnutrition of moderate degree 04/27/2016  . Severe malnutrition ([place])   . Sepsis ([place]) 04/25/2016  . Ovarian cancer ([place]) 04/25/2016  . Malignant ascites 04/25/2016  . Microcytic anemia 04/25/2016  . Thrombocytosis ([place]) 04/25/2016  . Hyponatremia 04/25/2016    Orientation RESPIRATION BLADDER Height & Weight     Self, Time, Situation, Place  Normal Continent Weight: 206 lb (93.4 kg) Height:  5\' 5"  (165.1 cm)  BEHAVIORAL SYMPTOMS/MOOD NEUROLOGICAL BOWEL NUTRITION STATUS      Continent Diet (cardiac)  AMBULATORY STATUS COMMUNICATION OF NEEDS Skin   Supervision Verbally Normal                       Personal Care Assistance Level of Assistance  Bathing, Dressing Bathing Assistance: Limited assistance   Dressing Assistance: Limited assistance     Functional Limitations Info             SPECIAL CARE FACTORS FREQUENCY  PT (By licensed PT)     PT Frequency: 3/wk OT Frequency: 3/wk            Contractures      Additional Factors Info  Allergies, Code Status Code Status Info: full Allergies Info: Pseudoephedrine           Current Medications (05/05/2016):  This is the current hospital active medication  list Current Facility-Administered Medications  Medication Dose Route Frequency Provider Last Rate Last Dose  . acetaminophen (TYLENOL) tablet 650 mg  650 mg Oral Q6H PRN Vianne Bulls, MD      . ceFAZolin (ANCEF) IVPB 2g/100 mL premix  2 g Intravenous Q8H Jonetta Osgood, MD 200 mL/hr at 05/05/16 0927 2 g at 05/05/16 0927  . enoxaparin (LOVENOX) injection 140 mg  140 mg Subcutaneous Q24H Thurnell Lose, MD   140 mg at 05/05/16 0928  . feeding supplement (BOOST / RESOURCE BREEZE) liquid 1 Container  1 Container Oral TID BM Thurnell Lose, MD   1 Container at 05/05/16 [phone]  . ferrous sulfate tablet 325 mg  325 mg Oral BID WC Jonetta Osgood, MD   325 mg at 05/05/16 0927  . furosemide (LASIX) injection 40 mg  40 mg Intravenous Daily Thurnell Lose, MD   40 mg at 05/05/16 0926  . metoprolol (LOPRESSOR) injection 5 mg  5 mg Intravenous Q6H PRN Jonetta Osgood, MD      . metoprolol tartrate (LOPRESSOR) tablet 25 mg  25 mg Oral BID Thurnell Lose, MD   25 mg at 05/05/16 0927  . multivitamin with minerals tablet 1 tablet  1 tablet Oral Daily Jonetta Osgood, MD   1 tablet at 05/05/16 [phone]  . oxyCODONE (Oxy  IR/ROXICODONE) immediate release tablet 5 mg  5 mg Oral Q6H PRN Thurnell Lose, MD   5 mg at 05/05/16 0927  . pantoprazole (PROTONIX) EC tablet 40 mg  40 mg Oral Daily Thurnell Lose, MD   40 mg at 05/05/16 0928  . polyethylene glycol (MIRALAX / GLYCOLAX) packet 17 g  17 g Oral Daily Jonetta Osgood, MD   17 g at 05/05/16 2119  . potassium chloride SA (K-DUR,KLOR-CON) CR tablet 20 mEq  20 mEq Oral Daily Thurnell Lose, MD   20 mEq at 05/05/16 0927  . promethazine (PHENERGAN) tablet 12.5 mg  12.5 mg Oral Q6H PRN Vianne Bulls, MD   12.5 mg at 04/27/16 0436  . sodium chloride (OCEAN) 0.65 % nasal spray 1 spray  1 spray Each Nare QID Thurnell Lose, MD   1 spray at 05/04/16 0948  . spironolactone (ALDACTONE) tablet 25 mg  25 mg Oral Daily Thurnell Lose, MD   25 mg at 05/05/16  [id number]  . vitamin B-12 (CYANOCOBALAMIN) tablet 1,000 mcg  1,000 mcg Oral Daily Thurnell Lose, MD   1,000 mcg at 05/05/16 0814     Discharge Medications: Please see discharge summary for a list of discharge medications.  Relevant Imaging Results:  Relevant Lab Results:   Additional Information SS#: [id number], will need IV ancef q 8 hours through 05/14/16  Jorge Ny, LCSW

## 2016-05-05 NOTE — Consult Note (Signed)
[facility] CONSULT NOTE  No care team member to display  CHIEF COMPLAINTS/PURPOSE OF CONSULTATION:  GYN cancer, endometrial cancer versus ovarian cancer, for further management  HISTORY OF PRESENTING ILLNESS:  Allison Haynes 61 y.o. female is evaluated at the request of Dr. Denman George for consideration for neoadjuvant chemotherapy. Her daughter, Anderson Malta is present. This patient had no significant past medical history.  She lives alone in [place] and has been visiting her mother when she started to feel unwell over the past 7 days. She complained a rapid abdominal distention with sensation of nausea. The patient has been hospitalized since 04/25/2016. At the time of presentation, she presented with significant microcytic anemia with thrombocytosis, abnormal imaging study and clinical presentation of sepsis secondary to peritonitis. Since hospitalization, she has received medical optimization with IV antibiotics, blood transfusion, evaluation with CT-guided biopsy, paracentesis, etc. Culture of peritoneal fluid came back positive for Staphylococcus, pan-sensitive, currently prescribed cefazolin. Formal biopsy report is still pending but preliminary results suspect GYN malignancy.  CA 125 is elevated at 227.7. Giving her significant protein calorie malnutrition, severe anemia, infection with peritonitis and significant deconditioning, the plan would be for her to continue antibiotics until May 19, 2016.  She will be transitioned to skilled nursing facility for rehabilitation. From the cancer standpoint, the decision would be to consider neoadjuvant chemotherapy.  MEDICAL HISTORY:  Past Medical History:  Diagnosis Date  . 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 IR PARACENTESIS  04/28/2016    SOCIAL HISTORY: Social History   Social History  . Marital status: Widowed    Spouse name: N/A  . Number of  children: N/A  . Years of education: N/A   Occupational History  . Not on file.   Social History Main Topics  . Smoking status: Never Smoker  . Smokeless tobacco: Never Used  . Alcohol use No  . Drug use: No  . Sexual activity: Not on file   Other Topics Concern  . Not on file   Social History Narrative  . No narrative on file    FAMILY HISTORY: Family History  Problem Relation Age of Onset  . Diabetes Mellitus II Mother   . Hypertension Mother   . Diabetes Mellitus II Father   . Hypertension Father   . Breast cancer Maternal Aunt   . Breast cancer Paternal Aunt     ALLERGIES:  is allergic to pseudoephedrine.  MEDICATIONS:  Current Facility-Administered Medications  Medication Dose Route Frequency Provider Last Rate Last Dose  . acetaminophen (TYLENOL) tablet 650 mg  650 mg Oral Q6H PRN Vianne Bulls, MD      . ceFAZolin (ANCEF) IVPB 2g/100 mL premix  2 g Intravenous Q8H Jonetta Osgood, MD 200 mL/hr at 05/05/16 1719 2 g at 05/05/16 1719  . [START ON 05/06/2016] enoxaparin (LOVENOX) injection 40 mg  40 mg Subcutaneous Q24H Thurnell Lose, MD      . feeding supplement (BOOST / RESOURCE BREEZE) liquid 1 Container  1 Container Oral TID BM Thurnell Lose, MD   1 Container at 05/05/16 [phone]  . ferrous sulfate tablet 325 mg  325 mg Oral BID WC Jonetta Osgood, MD   325 mg at 05/05/16 1718  . furosemide (LASIX) injection 40 mg  40 mg Intravenous Daily Thurnell Lose, MD   40 mg at 05/05/16 0926  . metoprolol (LOPRESSOR) injection 5 mg  5 mg Intravenous Q6H PRN  Shanker Kristeen Mans, MD      . metoprolol tartrate (LOPRESSOR) tablet 25 mg  25 mg Oral BID Thurnell Lose, MD   25 mg at 05/05/16 1497  . multivitamin with minerals tablet 1 tablet  1 tablet Oral Daily Jonetta Osgood, MD   1 tablet at 05/05/16 [phone]  . oxyCODONE (Oxy IR/ROXICODONE) immediate release tablet 5 mg  5 mg Oral Q6H PRN Thurnell Lose, MD   5 mg at 05/05/16 0927  . pantoprazole (PROTONIX) EC tablet  40 mg  40 mg Oral Daily Thurnell Lose, MD   40 mg at 05/05/16 0928  . polyethylene glycol (MIRALAX / GLYCOLAX) packet 17 g  17 g Oral Daily Jonetta Osgood, MD   17 g at 05/05/16 7858  . potassium chloride SA (K-DUR,KLOR-CON) CR tablet 20 mEq  20 mEq Oral Daily Thurnell Lose, MD   20 mEq at 05/05/16 0927  . promethazine (PHENERGAN) tablet 12.5 mg  12.5 mg Oral Q6H PRN Vianne Bulls, MD   12.5 mg at 04/27/16 0436  . sodium chloride (OCEAN) 0.65 % nasal spray 1 spray  1 spray Each Nare QID Thurnell Lose, MD   1 spray at 05/04/16 0948  . spironolactone (ALDACTONE) tablet 25 mg  25 mg Oral Daily Thurnell Lose, MD   25 mg at 05/05/16 8502  . vitamin B-12 (CYANOCOBALAMIN) tablet 1,000 mcg  1,000 mcg Oral Daily Thurnell Lose, MD   1,000 mcg at 05/05/16 7741    REVIEW OF SYSTEMS:   Constitutional: Denies fevers, chills or abnormal night sweats Eyes: Denies blurriness of vision, double vision or watery eyes Ears, nose, mouth, throat, and face: Denies mucositis or sore throat Respiratory: Denies cough, dyspnea or wheezes Cardiovascular: Denies palpitation, chest discomfort Lymphatics: Denies new lymphadenopathy or easy bruising Behavioral/Psych: Mood is stable, no new changes  All other systems were reviewed with the patient and are negative.  PHYSICAL EXAMINATION: ECOG PERFORMANCE STATUS: 2 - Symptomatic, <50% confined to bed  Vitals:   05/05/16 0511 05/05/16 1318  BP: (!) 154/47 124/60  Pulse: 78 77  Resp: 18 17  Temp: 98 F (36.7 C) 98.6 F (37 C)   Filed Weights   05/02/16 0500 05/03/16 0457 05/05/16 0511  Weight: 205 lb 4 oz (93.1 kg) 205 lb 9.6 oz (93.3 kg) 206 lb (93.4 kg)    GENERAL:alert, no distress and comfortable.  The patient appeared disheveled.  She looks pale SKIN: Extensive bruises are noted.  She looks pale  EYES: normal, conjunctiva are pale and non-injected, sclera clear OROPHARYNX:no exudate, no erythema and lips, buccal mucosa, and tongue normal   NECK: supple, thyroid normal size, non-tender, without nodularity LYMPH:  no palpable lymphadenopathy in the cervical, axillary or inguinal LUNGS: clear to auscultation and percussion with normal breathing effort HEART: regular rate & rhythm and no murmurs with moderate bilateral lower extremity edema ABDOMEN:abdomen soft, grossly distended with ascitic fluid, nontender Musculoskeletal:no cyanosis of digits and no clubbing  PSYCH: alert & oriented x 3 with fluent speech NEURO: no focal motor/sensory deficits  LABORATORY DATA:  I have reviewed the data as listed Lab Results  Component Value Date   WBC 17.9 (H) 05/04/2016   HGB 8.2 (L) 05/04/2016   HCT 28.0 (L) 05/04/2016   MCV 69.5 (L) 05/04/2016   PLT 357 05/04/2016    Recent Labs  04/26/16 2878  04/29/16 0219  05/01/16 0248 05/02/16 0427 05/03/16 0557 05/04/16 0548 05/05/16 0339  NA  --   < >  130*  < > 133* 135 134* 134* 133*  K  --   < > 4.5  < > 3.5 3.7 3.7 3.7 3.7  CL  --   < > 101  < > 100* 102 99* 100* 99*  CO2  --   < > 20*  < > 23 27 25 27 28   GLUCOSE  --   < > 117*  < > 107* 116* 108* 103* 100*  BUN  --   < > 31*  < > 26* 25* 22* 21* 20  CREATININE  --   < > 0.83  < > 0.73 0.68 0.62 0.61 0.63  CALCIUM  --   < > 7.7*  < > 7.9* 7.9* 7.8* 7.9* 7.8*  GFRNONAA  --   < > >60  < > >60 >60 >60 >60 >60  GFRAA  --   < > >60  < > >60 >60 >60 >60 >60  PROT 5.0*  < > 5.3*  --  5.4* 5.3*  --   --   --   ALBUMIN 1.6*  < > 1.8*  --  1.9* 1.8*  --   --   --   AST 14*  < > 13*  --  15 26  --   --   --   ALT 9*  < > 7*  --  6* 7*  --   --   --   ALKPHOS 67  < > 84  --  100 84  --   --   --   BILITOT 1.3*  < > 0.6  --  0.6 0.2*  --   --   --   BILIDIR 0.6*  --   --   --   --   --   --   --   --   IBILI 0.7  --   --   --   --   --   --   --   --   < > = values in this interval not displayed.  RADIOGRAPHIC STUDIES: I have personally reviewed the radiological images as listed and agreed with the findings in the report. Ct Angio  Chest Pe W Or Wo Contrast  Result Date: 04/27/2016 CLINICAL DATA:  61 year old female with history of ovarian cancer presenting with tachycardia. EXAM: CT ANGIOGRAPHY CHEST WITH CONTRAST TECHNIQUE: Multidetector CT imaging of the chest was performed using the standard protocol during bolus administration of intravenous contrast. Multiplanar CT image reconstructions and MIPs were obtained to evaluate the vascular anatomy. CONTRAST:  100 cc Isovue 370 COMPARISON:  Chest radiograph dated 04/25/2016 and CT of the abdomen pelvis dated 04/25/2016 FINDINGS: Cardiovascular: There is mild cardiomegaly. No pericardial effusion. There is coronary vascular calcification involving the LAD. The thoracic aorta appears unremarkable. The origins of the great vessels of the aortic arch appear patent. There is dilatation of the main pulmonary trunk suggestive of underlying pulmonary hypertension. Evaluation of the pulmonary arteries is limited due to suboptimal opacification of the peripheral branches. No central pulmonary artery embolus identified. Mediastinum/Nodes: There is no hilar or mediastinal adenopathy. The esophagus and the thyroid gland are grossly unremarkable. Lungs/Pleura: There is a small left pleural effusion. There are consolidative changes of the left lower lobe as well as scattered densities in the left upper lobe. Overall there is volume loss in the left lung with shift of the heart into the left hemithorax. There is segmental air trapping in the right middle lobe with diffuse hazy airspace  density in the right upper and right lower lobes. Paramediastinal subpleural density in the right upper lobe likely represents atelectatic changes or pleural thickening. There is no pneumothorax. Upper Abdomen: Ascites is partially visualized. Musculoskeletal: Multilevel degenerative changes of the spine. No acute osseous pathology. Review of the MIP images confirms the above findings. IMPRESSION: 1. No CT evidence of  pulmonary artery embolus. 2. Areas of consolidative change involving the left lower lobe as well as scattered pulmonary densities in the left upper lobe. Findings may represent pneumonia. Correlation with clinical exam recommended. 3. Overall decreased left lung volume with leftward shift of the mediastinum and heart. 4. Segmental air trapping in the right middle lobe with diffuse haziness of the right upper and right lower lobes. 5. Ascites. Electronically Signed   By: Anner Crete M.D.   On: 04/27/2016 05:40   Ct Abdomen Pelvis W Contrast  Result Date: 04/25/2016 CLINICAL DATA:  60 year old female with history of abdominal distension and nausea. No recent vomiting. EXAM: CT ABDOMEN AND PELVIS WITH CONTRAST TECHNIQUE: Multidetector CT imaging of the abdomen and pelvis was performed using the standard protocol following bolus administration of intravenous contrast. CONTRAST:  1 ISOVUE-300 IOPAMIDOL (ISOVUE-300) INJECTION 61% COMPARISON:  No priors. FINDINGS: Lower chest: Air trapping right middle lobe. Small left pleural effusion. Atherosclerotic calcifications in the left anterior descending and left circumflex coronary arteries. Hepatobiliary: No suspicious cystic or solid hepatic lesions. No intra or extrahepatic biliary ductal dilatation. Small calcified gallstones lying dependently in the gallbladder. No findings to suggest an acute cholecystitis at this time. Pancreas: No pancreatic mass. No pancreatic ductal dilatation. No pancreatic or peripancreatic fluid or inflammatory changes. Spleen: Unremarkable. Adrenals/Urinary Tract: Right kidney and bilateral adrenal glands are normal in appearance. 1.9 cm simple cyst in the interpolar region of the left kidney. No hydroureteronephrosis. Urinary bladder is normal in appearance. Stomach/Bowel: The appearance of the stomach is normal. There is no pathologic dilatation of small bowel or colon. Appendix is not confidently visualized. Vascular/Lymphatic: No  significant atherosclerotic disease, aneurysm or dissection identified in the abdominal or pelvic vasculature. No lymphadenopathy noted in the abdomen or pelvis. Reproductive: Large complex mixed cystic and solid mass which emanates from one or both of the adnexal regions, highly concerning for ovarian neoplasm. This measures approximately 20.5 x 23.1 x 24.6 cm (coronal image 46 of series 6 and sagittal image 65 of series 7), and has mixed cystic and solid components, with some heterogeneous internal calcification. Uterus is also heterogeneous in appearance, with multiple densely calcified lesions, presumably multiple fibroids, largest of which measures 5.5 cm in the anterior aspect of the fundal region. Other: Large volume of ascites. Extensive nodular soft tissue thickening throughout the omentum, compatible with omental caking. Some of this extends through the umbilical region. No pneumoperitoneum. Musculoskeletal: There are no aggressive appearing lytic or blastic lesions noted in the visualized portions of the skeleton. IMPRESSION: 1. 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 Consultation with Gynecology is recommended. 2. Small left pleural effusion. 3. Aortic atherosclerosis, in addition to 2 vessel coronary artery disease. Please note that although the presence of coronary artery calcium documents the presence of coronary artery disease, the severity of this disease and any potential stenosis cannot be assessed on this non-gated CT examination. Assessment for potential risk factor modification, dietary therapy or pharmacologic therapy may be warranted, if clinically indicated. 4. Cholelithiasis. Electronically Signed   By: Vinnie Langton M.D.   On: 04/25/2016 18:59  US Paracentesis  Result Date: 04/26/2016 INDICATION: Patient with abdominal pain and large complex solid/ cystic adnexal mass, ascites, omental  thickening concerning for ovarian cancer; request received for diagnostic and therapeutic paracentesis. EXAM: ULTRASOUND GUIDED DIAGNOSTIC AND THERAPEUTIC PARACENTESIS MEDICATIONS: None. COMPLICATIONS: None immediate. PROCEDURE: Informed written consent was obtained from the patient after a discussion of the risks, benefits and alternatives to treatment. A timeout was performed prior to the initiation of the procedure. Initial ultrasound scanning demonstrates a moderate amount of ascites within the left mid to lower abdominal quadrant. The left mid to lower abdomen was prepped and draped in the usual sterile fashion. 1% lidocaine was used for local anesthesia. Following this, a Yueh catheter was introduced. An ultrasound image was saved for documentation purposes. The paracentesis was performed. The catheter was removed and a dressing was applied. The patient tolerated the procedure well without immediate post procedural complication. FINDINGS: A total of approximately 2.5 liters of turbid, bloody fluid was removed. Samples were sent to the laboratory as requested by the clinical team. IMPRESSION: Successful ultrasound-guided diagnostic and therapeutic paracentesis yielding 2.5 liters of peritoneal fluid. Read by: Rowe Robert, PA-C Electronically Signed   By: Corrie Mckusick D.O.   On: 04/26/2016 13:58   Ct Biopsy  Result Date: 05/01/2016 INDICATION: LARGE ABDOMINOPELVIC COMPLEX CYSTIC MASS INVOLVING THE OMENTUM EXAM: CT-GUIDED BIOPSY ANTERIOR OMENTAL MASS MEDICATIONS: 1% LIDOCAINE LOCALLY ANESTHESIA/SEDATION: 1.0 mg IV Versed; 25 mcg IV Fentanyl Moderate Sedation Time:  13 minutes The patient was continuously monitored during the procedure by the interventional radiology nurse under my direct supervision. PROCEDURE: The procedure, risks, benefits, and alternatives were explained to the patient. Questions regarding the procedure were encouraged and answered. The patient understands and consents to the procedure.  Previous imaging reviewed. Patient positioned supine. Noncontrast localization CT performed. The large abdominopelvic mass involving the omentum was localized. Omental component was marked in the anterior abdomen just above the umbilicus. Under sterile conditions and local anesthesia, a 17 gauge coaxial guide needle was advanced into the anterior omental mass. Needle position confirmed with CT. 18 gauge core biopsies obtained. These were placed in formalin. Patient tolerated the procedure well without complication. Vital sign monitoring by nursing staff during the procedure will continue as patient is in the special procedures unit for post procedure observation. FINDINGS: The images document guide needle placement within the anterior omental mass. Post biopsy images demonstrate no hemorrhage or hematoma. COMPLICATIONS: None immediate. IMPRESSION: Successful CT-guided anterior omental mass 18 gauge core biopsy Electronically Signed   By: Jerilynn Mages.  Shick M.D.   On: 05/01/2016 15:17   Dg Chest Port 1 View  Result Date: 04/25/2016 CLINICAL DATA:  Abdominal distension with fever EXAM: PORTABLE CHEST 1 VIEW COMPARISON:  None. FINDINGS: Low lung volumes. Non inclusion of the left CP angle. Borderline to mild cardiomegaly, exaggerated by low lung volume. Mild atherosclerosis. No focal consolidation. No pneumothorax. IMPRESSION: Low lung volumes, without infiltrate or edema. Borderline to mild cardiomegaly. Electronically Signed   By: Donavan Foil M.D.   On: 04/25/2016 17:54   Ir Paracentesis  Result Date: 04/28/2016 INDICATION: Patient with ascites. Request is made for diagnostic and therapeutic paracentesis. EXAM: ULTRASOUND GUIDED DIAGNOSTIC AND THERAPEUTIC PARACENTESIS MEDICATIONS: 10 mL 1% lidocaine COMPLICATIONS: None immediate. PROCEDURE: Informed written consent was obtained from the patient after a discussion of the risks, benefits and alternatives to treatment. A timeout was performed prior to the initiation of  the procedure. Initial ultrasound scanning demonstrates a large amount of ascites within the right lateral  abdomen. The right lateral abdomen was prepped and draped in the usual sterile fashion. 1% lidocaine was used for local anesthesia. Following this, a 19 gauge, 7-cm, Yueh catheter was introduced. An ultrasound image was saved for documentation purposes. The paracentesis was performed. The catheter was removed and a dressing was applied. The patient tolerated the procedure well without immediate post procedural complication. FINDINGS: A total of approximately 530 mL of thick, amber fluid was removed. Samples were sent to the laboratory as requested by the clinical team. IMPRESSION: Successful ultrasound-guided diagnostic and therapeutic paracentesis yielding 530 liters of peritoneal fluid. Read by:  Brynda Greathouse PA-C Electronically Signed   By: Corrie Mckusick D.O.   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  I plan to repeat imaging after 3 cycles of chemotherapy to assess response to treatment.  I will coordinate care with GYN oncologist after 3 cycles of treatment to determine whether she would be a surgical candidate at the time of the complete 6 cycles of neoadjuvant chemotherapy before we  proceed with surgical debulking. The rationale of neoadjuvant chemotherapy approach is explained to the patient and her daughter and they agree with the plan of care.  Severe anemia, combined iron deficiency anemia and vitamin B12 deficiency She has received blood transfusion, oral supplement and B12 injection The patient may benefit from intravenous iron infusion in the future if her anemia does not respond to oral supplement I will order blood, monitoring when I see her back on May 19, 2016  Spontaneous bacterial peritonitis  She is receiving IV antibiotics as directed by infectious disease physician The plan will be to complete 3 weeks course of an IV antibiotics, last day of treatment around May 19, 2016   Severe protein calorie malnutrition She is currently on nutritional supplement.  Continue the same  Weakness and deconditioning Recommend rehabilitation at skilled nursing facility as tolerated  Abdominal ascites, bilateral lower extremity edema Multifactorial, likely secondary to third spacing from low protein state, poor mobility, compression of venous flow from tumor She is receiving intermittent diuretic therapy as tolerated  Recent atrial fibrillation, resolved Likely triggered by sepsis and severe anemia  CODE STATUS Full code  Poor venous access She is receiving PICC line placement I will recommend power PICC placement and to keep it patent for future chemotherapy administration after IV antibiotics treatment is completed  Discharge planning She will likely be discharged to skilled nursing facility within the next 24-48 hours I will make arrangement to see her back in my office on May 19, 2016 and to start chemotherapy on May 20, 2016 I addressed all the questions and concerns and will sign off.  Please call if questions arise   All questions were answered. The patient knows to call the clinic with any problems, questions or concerns.    Heath Lark,  MD 05/05/2016  5:48 PM

## 2016-05-05 NOTE — Progress Notes (Signed)
PROGRESS NOTE        PATIENT DETAILS Name: Allison Haynes Age: 61 y.o. Sex: female Date of Birth: 1955-03-11 Admit Date: 04/25/2016 Admitting Physician Vianne Bulls, MD PCP:No primary care provider on file.  Brief Narrative: Patient is a 61 y.o. female with no significant medical history-presented to the ED on 4/21 with worsening abdominal pain,distention and fever. CT scan of the abdomen showed a large complex cystic/solid mass emanating from one of both of the adnexal regions with peritoneal/omental caking and potential malignant ascites. She underwent paracentesis 2, with negative cytology. Her initial paracentesis on 4/22, showed gram-positive cocci, but cultures were negative. Blood cultures done on admission was positive for MSSA. 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 Thankfully, with empiric antimicrobial therapy-sepsis pathophysiology has resolved, and she is slowly improving. Please note, hospital course has been complicated by a brief episode of A. fib RVR, multifactorial anemia requiring PRBC 3, and volume overload. See below for further details.  Note: Patient has not seen a physician or sought routine medical in the outpatient setting for approximately 25 years!  Subjective:  Patient in bed, appears comfortable, denies any headache, no fever, no chest pain or pressure, no shortness of breath , no abdominal pain. No focal weakness. Mild abdominal distention but no pain.   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 Blood cultures 1 out of 2 drawn on 04/25/2016 positive for MSSA. Echocardiogram does not show any signs of vegetations. Case discussed with Dr. Johnnye Sima on 05/05/2016 she will receive a PICC line for total of 3 weeks of IV antibiotics at [facility].  She underwent CT-guided omental biopsy by IR on 05/01/2016 to correctly diagnose the etiology of her omental mass as paratonia and fluid cytology 2 remained negative. Follow biopsy culture results and cytology. 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 I also called Dr. Denman George oncologist who will have the patient seen today by one of her partners, for future plan.   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 Unfortunately, cytology negative 2- even though CA 125 elevated, she underwent omental biopsy on 05/01/2016 follow cytology.  Paroxysmal atrial fibrillation [place] vasc 2 score off 1. Currently stable on oral beta blocker, echo shows preserved EF with grade 2 chronic diastolic CHF. Currently on Lovenox for SVT see below for details.  Left small saphenous vein SVT. Discussed with vascular surgeon Dr. Scot Dock on 05/02/2016, due to high suspicion for underlying malignancy for now start her on Lovenox and monitor.   Significant volume overload with lower extremity edema: The combination of adnexal mass and omental metastases along with possible acute on chronic diastolic CHF. Continue diuresis, unfortunately venous ultrasound shows right superficial SVT in the lower extremity. Apply Unna boots, anticoagulate and monitor.  Anemia of chronic  disease: Suspect mostly has anemia of chronic disease with elements of vitamin B12 deficiency and  some iron deficiency (soluble transferrin receptor assay slightly increased). Anemia has worsened due to critical illness as well. She has required 3 units of PRBC so far, she has been started on vitamin B12 and iron supplementation. Trend hemoglobin periodically. No signs of ongoing GI blood loss, and oral PPI.  Minimally elevated Troponin level: Likely secondary to demand ischemia-doubt ACS. Given issues with malignancy. severity of anemia-doubt further workup needed at this time. Echo showed preserved EF without any wall motion abnormalities  Left arm ecchymosis: Occurred when IV infiltrated when she was receiving of PRBC transfusion. This appears stable-ecchymosis mostly appears to be very superficial.  Thrombocytosis: Likely reactive-due to underlying malignancy/chronic inflammation.  Moderate protein calorie malnutrition: Seen by nutrition, continue supplements.  Scalp mass: Per patient is has been there for the past 12 years-see pictures below. I do not think this is related to her presentation. She will likely require a biopsy-at some point-probably as an outpatient. See pictures below.   DVT Prophylaxis: Prophylactic Lovenox   Code Status: Full code   Family Communication: Daughter 05/05/2016  Disposition Plan:  Tele  Antimicrobial agents: Anti-infectives    Start     Dose/Rate Route Frequency Ordered Stop   04/28/16 0500  vancomycin (VANCOCIN) 1,500 mg in sodium chloride 0.9 % 500 mL IVPB  Status:  Discontinued     1,500 mg 250 mL/hr over 120 Minutes Intravenous Every 24 hours 04/27/16 0908 04/27/16 1544   04/27/16 1700  ceFAZolin (ANCEF) IVPB 1 g/50 mL premix  Status:  Discontinued     1 g 100 mL/hr over 30 Minutes Intravenous Every 8 hours 04/27/16 1544 04/27/16 1548   04/27/16 1700  ceFAZolin (ANCEF) IVPB 2g/100 mL premix     2 g 200 mL/hr over 30 Minutes Intravenous Every 8 hours 04/27/16 1548     04/26/16 1700  vancomycin (VANCOCIN) 1,250 mg in sodium chloride 0.9  % 250 mL IVPB  Status:  Discontinued     1,250 mg 166.7 mL/hr over 90 Minutes Intravenous Every 12 hours 04/26/16 0908 04/27/16 0908   04/26/16 0200  piperacillin-tazobactam (ZOSYN) IVPB 3.375 g  Status:  Discontinued     3.375 g 12.5 mL/hr over 240 Minutes Intravenous Every 8 hours 04/25/16 1827 04/27/16 1544   04/25/16 2300  vancomycin (VANCOCIN) IVPB 1000 mg/200 mL premix  Status:  Discontinued     1,000 mg 200 mL/hr over 60 Minutes Intravenous Every 8 hours 04/25/16 2247 04/26/16 0908   04/25/16 1745  piperacillin-tazobactam (ZOSYN) IVPB 3.375 g     3.375 g 100 mL/hr over 30 Minutes Intravenous  Once 04/25/16 1738 04/25/16 1854      Procedures:  Bilateral lower extremity venous duplex completed.  Preliminary report:  There is no DVT noted in the bilateral lower extremities.  There is superficial thrombosis noted in the left small saphenous vein.  There is a small Baker's cyst noted in the right popliteal fossa.   US paracentesis - Successful ultrasound-guided diagnostic and therapeutic paracentesis yielding 530 liters of peritoneal fluid.  CT guided - omental biopsy 05-02-16 -    CONSULTS:  GYN Oncology  Time spent: 30-minutes-Greater than 50% of this time was spent in counseling, explanation of diagnosis, planning of further management, and coordination of care.  MEDICATIONS: Scheduled Meds: . enoxaparin (LOVENOX) injection  140 mg Subcutaneous Q24H  . feeding supplement  1 Container Oral TID BM  . ferrous sulfate  325  mg Oral BID WC  . furosemide  40 mg Intravenous Daily  . metoprolol tartrate  25 mg Oral BID  . multivitamin with minerals  1 tablet Oral Daily  . pantoprazole  40 mg Oral Daily  . polyethylene glycol  17 g Oral Daily  . potassium chloride  20 mEq Oral Daily  . sodium chloride  1 spray Each Nare QID  . spironolactone  25 mg Oral Daily  . vitamin B-12  1,000 mcg Oral Daily   Continuous Infusions: .  ceFAZolin (ANCEF) IV 2 g (05/05/16 0927)   PRN  Meds:.acetaminophen **OR** [DISCONTINUED] acetaminophen, metoprolol, oxyCODONE, promethazine   PHYSICAL EXAM:  Vital signs: Vitals:   05/04/16 0515 05/04/16 1352 05/04/16 2132 05/05/16 0511  BP: (!) 122/57 105/60 (!) 114/48 (!) 154/47  Pulse: 66 73 84 78  Resp: 16 18  18   Temp: 98.3 F (36.8 C) 97.4 F (36.3 C) 98.8 F (37.1 C) 98 F (36.7 C)  TempSrc: Oral Oral Oral Oral  SpO2: 95% 100% 97% 98%  Weight:    93.4 kg (206 lb)  Height:       Filed Weights   05/02/16 0500 05/03/16 0457 05/05/16 0511  Weight: 93.1 kg (205 lb 4 oz) 93.3 kg (205 lb 9.6 oz) 93.4 kg (206 lb)   Body mass index is 34.28 kg/m.   Awake Alert, Oriented X 3, No new F.N deficits, Normal affect [place].AT,PERRAL Supple Neck,No JVD, No cervical lymphadenopathy appriciated.  Symmetrical Chest wall movement, Good air movement bilaterally, CTAB RRR,No Gallops,Rubs or new Murmurs, No Parasternal Heave +ve B.Sounds, Abd Soft, No tenderness, No organomegaly appriciated, No rebound - guarding or rigidity. No Cyanosis, Clubbing or edema, No new Rash or bruise Stable Bruise in the left arm   Note picture taken on 4/27 after patient's permission.     I have personally reviewed following labs and imaging studies  LABORATORY DATA: CBC:  Recent Labs Lab 04/30/16 0247 05/01/16 0248 05/02/16 0427 05/03/16 0557 05/04/16 0548  WBC 23.4* 23.1* 21.6* 17.4* 17.9*  NEUTROABS  --   --  19.4*  --   --   HGB 6.9* 8.2* 7.9* 7.9* 8.2*  HCT 25.1* 27.4* 28.3* 26.2* 28.0*  MCV 66.1* 67.7* 68.9* 68.6* 69.5*  PLT 365 364 375 348 502    Basic Metabolic Panel:  Recent Labs Lab 05/01/16 0248 05/02/16 0427 05/03/16 0557 05/04/16 0548 05/05/16 0339  NA 133* 135 134* 134* 133*  K 3.5 3.7 3.7 3.7 3.7  CL 100* 102 99* 100* 99*  CO2 23 27 25 27 28   GLUCOSE 107* 116* 108* 103* 100*  BUN 26* 25* 22* 21* 20  CREATININE 0.73 0.68 0.62 0.61 0.63  CALCIUM 7.9* 7.9* 7.8* 7.9* 7.8*  MG  --   --   --   --  1.9     GFR: Estimated Creatinine Clearance: 84.5 mL/min (by C-G formula based on SCr of 0.63 mg/dL).  Liver Function Tests:  Recent Labs Lab 04/29/16 0219 05/01/16 0248 05/02/16 0427  AST 13* 15 26  ALT 7* 6* 7*  ALKPHOS 84 100 84  BILITOT 0.6 0.6 0.2*  PROT 5.3* 5.4* 5.3*  ALBUMIN 1.8* 1.9* 1.8*   No results for input(s): LIPASE, AMYLASE in the last 168 hours. No results for input(s): AMMONIA in the last 168 hours.  Coagulation Profile:  Recent Labs Lab 05/01/16 0958  INR 1.14    Cardiac Enzymes: No results for input(s): CKTOTAL, CKMB, CKMBINDEX, TROPONINI in the last 168 hours.  BNP (  last 3 results) No results for input(s): PROBNP in the last 8760 hours.  HbA1C: No results for input(s): HGBA1C in the last 72 hours.  CBG:  Recent Labs Lab 05/02/16 0810 05/02/16 2028 05/03/16 0755 05/04/16 0752 05/05/16 0749  GLUCAP 103* 145* 104* 89 98    Lipid Profile: No results for input(s): CHOL, HDL, LDLCALC, TRIG, CHOLHDL, LDLDIRECT in the last 72 hours.  Thyroid Function Tests: No results for input(s): TSH, T4TOTAL, FREET4, T3FREE, THYROIDAB in the last 72 hours.  Anemia Panel: No results for input(s): VITAMINB12, FOLATE, FERRITIN, TIBC, IRON, RETICCTPCT in the last 72 hours.  Urine analysis:    Component Value Date/Time   COLORURINE AMBER (A) 04/26/2016 0530   APPEARANCEUR CLEAR 04/26/2016 0530   LABSPEC >1.046 (H) 04/26/2016 0530   PHURINE 5.0 04/26/2016 0530   GLUCOSEU NEGATIVE 04/26/2016 0530   HGBUR NEGATIVE 04/26/2016 0530   BILIRUBINUR NEGATIVE 04/26/2016 0530   KETONESUR NEGATIVE 04/26/2016 0530   PROTEINUR NEGATIVE 04/26/2016 0530   NITRITE NEGATIVE 04/26/2016 0530   LEUKOCYTESUR NEGATIVE 04/26/2016 0530    Sepsis Labs: Lactic Acid, Venous    Component Value Date/Time   LATICACIDVEN 1.4 04/27/2016 0309    MICROBIOLOGY: Recent Results (from the past 240 hour(s))  Blood Culture (routine x 2)     Status: None   Collection Time: 04/25/16   5:54 PM  Result Value Ref Range Status   Specimen Description BLOOD RIGHT WRIST  Final   Special Requests IN PEDIATRIC BOTTLE Blood Culture adequate volume  Final   Culture NO GROWTH 5 DAYS  Final   Report Status 04/30/2016 FINAL  Final  Blood Culture (routine x 2)     Status: Abnormal   Collection Time: 04/25/16  5:58 PM  Result Value Ref Range Status   Specimen Description BLOOD RIGHT ANTECUBITAL  Final   Special Requests   Final    BOTTLES DRAWN AEROBIC AND ANAEROBIC Blood Culture adequate volume   Culture  Setup Time   Final    GRAM POSITIVE COCCI IN CLUSTERS AEROBIC BOTTLE ONLY Organism ID to follow CRITICAL RESULT CALLED TO, READ BACK BY AND VERIFIED WITH: AElta [place].D. 12:00 04/27/16 ([email or web address])    Culture STAPHYLOCOCCUS AUREUS (A)  Final   Report Status 04/29/2016 FINAL  Final   Organism ID, Bacteria STAPHYLOCOCCUS AUREUS  Final      Susceptibility   Staphylococcus aureus - MIC*    CIPROFLOXACIN <=0.5 SENSITIVE Sensitive     ERYTHROMYCIN <=0.25 SENSITIVE Sensitive     GENTAMICIN <=0.5 SENSITIVE Sensitive     OXACILLIN <=0.25 SENSITIVE Sensitive     TETRACYCLINE <=1 SENSITIVE Sensitive     VANCOMYCIN 1 SENSITIVE Sensitive     TRIMETH/SULFA <=10 SENSITIVE Sensitive     CLINDAMYCIN <=0.25 SENSITIVE Sensitive     RIFAMPIN <=0.5 SENSITIVE Sensitive     Inducible Clindamycin NEGATIVE Sensitive     * STAPHYLOCOCCUS AUREUS  Blood Culture ID Panel (Reflexed)     Status: Abnormal   Collection Time: 04/25/16  5:58 PM  Result Value Ref Range Status   Enterococcus species NOT DETECTED NOT DETECTED Final   Listeria monocytogenes NOT DETECTED NOT DETECTED Final   Staphylococcus species DETECTED (A) NOT DETECTED Final    Comment: CRITICAL RESULT CALLED TO, READ BACK BY AND VERIFIED WITH: AElta [place].D. 12:00 04/27/16 ([email or web address])    Staphylococcus aureus DETECTED (A) NOT DETECTED Final    Comment: Methicillin (oxacillin) susceptible Staphylococcus aureus (MSSA). Preferred  therapy is anti staphylococcal beta lactam antibiotic (Cefazolin or  Nafcillin), unless clinically contraindicated. CRITICAL RESULT CALLED TO, READ BACK BY AND VERIFIED WITH: AElta [place].D. 12:00 04/27/16 ([email or web address])    Methicillin resistance NOT DETECTED NOT DETECTED Final   Streptococcus species NOT DETECTED NOT DETECTED Final   Streptococcus agalactiae NOT DETECTED NOT DETECTED Final   Streptococcus pneumoniae NOT DETECTED NOT DETECTED Final   Streptococcus pyogenes NOT DETECTED NOT DETECTED Final   Acinetobacter baumannii NOT DETECTED NOT DETECTED Final   Enterobacteriaceae species NOT DETECTED NOT DETECTED Final   Enterobacter cloacae complex NOT DETECTED NOT DETECTED Final   Escherichia coli NOT DETECTED NOT DETECTED Final   Klebsiella oxytoca NOT DETECTED NOT DETECTED Final   Klebsiella pneumoniae NOT DETECTED NOT DETECTED Final   Proteus species NOT DETECTED NOT DETECTED Final   Serratia marcescens NOT DETECTED NOT DETECTED Final   Haemophilus influenzae NOT DETECTED NOT DETECTED Final   Neisseria meningitidis NOT DETECTED NOT DETECTED Final   Pseudomonas aeruginosa NOT DETECTED NOT DETECTED Final   Candida albicans NOT DETECTED NOT DETECTED Final   Candida glabrata NOT DETECTED NOT DETECTED Final   Candida krusei NOT DETECTED NOT DETECTED Final   Candida parapsilosis NOT DETECTED NOT DETECTED Final   Candida tropicalis NOT DETECTED NOT DETECTED Final  MRSA PCR Screening     Status: None   Collection Time: 04/25/16 10:25 PM  Result Value Ref Range Status   MRSA by PCR NEGATIVE NEGATIVE Final    Comment:        The GeneXpert MRSA Assay (FDA approved for NASAL specimens only), is one component of a comprehensive MRSA colonization surveillance program. It is not intended to diagnose MRSA infection nor to guide or monitor treatment for MRSA infections.   Urine culture     Status: None   Collection Time: 04/26/16  5:30 AM  Result Value Ref Range Status   Specimen  Description URINE, RANDOM  Final   Special Requests NONE  Final   Culture NO GROWTH  Final   Report Status 04/27/2016 FINAL  Final  Culture, body fluid-bottle     Status: Abnormal   Collection Time: 04/26/16  1:36 PM  Result Value Ref Range Status   Specimen Description FLUID  Final   Special Requests PERITONEAL  Final   Gram Stain   Final    GRAM POSITIVE COCCI IN CLUSTERS ANAEROBIC BOTTLE ONLY CRITICAL RESULT CALLED TO, READ BACK BY AND VERIFIED WITH: C. STATEN RN, AT 1347 04/30/16 BY D. VANHOOK    Culture STAPHYLOCOCCUS AUREUS (A)  Final   Report Status 05/02/2016 FINAL  Final   Organism ID, Bacteria STAPHYLOCOCCUS AUREUS  Final      Susceptibility   Staphylococcus aureus - MIC*    CIPROFLOXACIN <=0.5 SENSITIVE Sensitive     ERYTHROMYCIN <=0.25 SENSITIVE Sensitive     GENTAMICIN <=0.5 SENSITIVE Sensitive     OXACILLIN <=0.25 SENSITIVE Sensitive     TETRACYCLINE <=1 SENSITIVE Sensitive     VANCOMYCIN 1 SENSITIVE Sensitive     TRIMETH/SULFA <=10 SENSITIVE Sensitive     CLINDAMYCIN <=0.25 SENSITIVE Sensitive     RIFAMPIN <=0.5 SENSITIVE Sensitive     Inducible Clindamycin NEGATIVE Sensitive     * STAPHYLOCOCCUS AUREUS  Gram stain     Status: None   Collection Time: 04/26/16  1:36 PM  Result Value Ref Range Status   Specimen Description FLUID  Final   Special Requests PERITONEAL  Final   Gram Stain   Final    WBC PRESENT, PREDOMINANTLY PMN GRAM POSITIVE COCCI IN CLUSTERS  IN PAIRS    Report Status 04/26/2016 FINAL  Final  Culture, blood (Routine X 2) w Reflex to ID Panel     Status: None   Collection Time: 04/28/16  4:14 AM  Result Value Ref Range Status   Specimen Description BLOOD LEFT ARM  Final   Special Requests AEROBIC BOTTLE ONLY Blood Culture adequate volume  Final   Culture NO GROWTH 5 DAYS  Final   Report Status 05/03/2016 FINAL  Final  Culture, body fluid-bottle     Status: None   Collection Time: 04/28/16 11:40 AM  Result Value Ref Range Status   Specimen  Description FLUID PERITONEAL  Final   Special Requests NONE  Final   Culture NO GROWTH 5 DAYS  Final   Report Status 05/03/2016 FINAL  Final  Gram stain     Status: None   Collection Time: 04/28/16 11:40 AM  Result Value Ref Range Status   Specimen Description FLUID PERITONEAL  Final   Special Requests NONE  Final   Gram Stain   Final    ABUNDANT WBC PRESENT,BOTH PMN AND MONONUCLEAR NO ORGANISMS SEEN    Report Status 04/29/2016 FINAL  Final    RADIOLOGY STUDIES/RESULTS:   Ct Angio Chest Pe W Or Wo Contrast  Result Date: 04/27/2016 CLINICAL DATA:  61 year old female with history of ovarian cancer presenting with tachycardia. EXAM: CT ANGIOGRAPHY CHEST WITH CONTRAST TECHNIQUE: Multidetector CT imaging of the chest was performed using the standard protocol during bolus administration of intravenous contrast. Multiplanar CT image reconstructions and MIPs were obtained to evaluate the vascular anatomy. CONTRAST:  100 cc Isovue 370 COMPARISON:  Chest radiograph dated 04/25/2016 and CT of the abdomen pelvis dated 04/25/2016 FINDINGS: Cardiovascular: There is mild cardiomegaly. No pericardial effusion. There is coronary vascular calcification involving the LAD. The thoracic aorta appears unremarkable. The origins of the great vessels of the aortic arch appear patent. There is dilatation of the main pulmonary trunk suggestive of underlying pulmonary hypertension. Evaluation of the pulmonary arteries is limited due to suboptimal opacification of the peripheral branches. No central pulmonary artery embolus identified. Mediastinum/Nodes: There is no hilar or mediastinal adenopathy. The esophagus and the thyroid gland are grossly unremarkable. Lungs/Pleura: There is a small left pleural effusion. There are consolidative changes of the left lower lobe as well as scattered densities in the left upper lobe. Overall there is volume loss in the left lung with shift of the heart into the left hemithorax. There is  segmental air trapping in the right middle lobe with diffuse hazy airspace density in the right upper and right lower lobes. Paramediastinal subpleural density in the right upper lobe likely represents atelectatic changes or pleural thickening. There is no pneumothorax. Upper Abdomen: Ascites is partially visualized. Musculoskeletal: Multilevel degenerative changes of the spine. No acute osseous pathology. Review of the MIP images confirms the above findings. IMPRESSION: 1. No CT evidence of pulmonary artery embolus. 2. Areas of consolidative change involving the left lower lobe as well as scattered pulmonary densities in the left upper lobe. Findings may represent pneumonia. Correlation with clinical exam recommended. 3. Overall decreased left lung volume with leftward shift of the mediastinum and heart. 4. Segmental air trapping in the right middle lobe with diffuse haziness of the right upper and right lower lobes. 5. Ascites. Electronically Signed   By: Anner Crete M.D.   On: 04/27/2016 05:40   Ct Abdomen Pelvis W Contrast  Result Date: 04/25/2016 CLINICAL DATA:  61 year old female with history of abdominal  distension and nausea. No recent vomiting. EXAM: CT ABDOMEN AND PELVIS WITH CONTRAST TECHNIQUE: Multidetector CT imaging of the abdomen and pelvis was performed using the standard protocol following bolus administration of intravenous contrast. CONTRAST:  1 ISOVUE-300 IOPAMIDOL (ISOVUE-300) INJECTION 61% COMPARISON:  No priors. FINDINGS: Lower chest: Air trapping right middle lobe. Small left pleural effusion. Atherosclerotic calcifications in the left anterior descending and left circumflex coronary arteries. Hepatobiliary: No suspicious cystic or solid hepatic lesions. No intra or extrahepatic biliary ductal dilatation. Small calcified gallstones lying dependently in the gallbladder. No findings to suggest an acute cholecystitis at this time. Pancreas: No pancreatic mass. No pancreatic ductal  dilatation. No pancreatic or peripancreatic fluid or inflammatory changes. Spleen: Unremarkable. Adrenals/Urinary Tract: Right kidney and bilateral adrenal glands are normal in appearance. 1.9 cm simple cyst in the interpolar region of the left kidney. No hydroureteronephrosis. Urinary bladder is normal in appearance. Stomach/Bowel: The appearance of the stomach is normal. There is no pathologic dilatation of small bowel or colon. Appendix is not confidently visualized. Vascular/Lymphatic: No significant atherosclerotic disease, aneurysm or dissection identified in the abdominal or pelvic vasculature. No lymphadenopathy noted in the abdomen or pelvis. Reproductive: Large complex mixed cystic and solid mass which emanates from one or both of the adnexal regions, highly concerning for ovarian neoplasm. This measures approximately 20.5 x 23.1 x 24.6 cm (coronal image 46 of series 6 and sagittal image 65 of series 7), and has mixed cystic and solid components, with some heterogeneous internal calcification. Uterus is also heterogeneous in appearance, with multiple densely calcified lesions, presumably multiple fibroids, largest of which measures 5.5 cm in the anterior aspect of the fundal region. Other: Large volume of ascites. Extensive nodular soft tissue thickening throughout the omentum, compatible with omental caking. Some of this extends through the umbilical region. No pneumoperitoneum. Musculoskeletal: There are no aggressive appearing lytic or blastic lesions noted in the visualized portions of the skeleton. IMPRESSION: 1. 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 Consultation with Gynecology is recommended. 2. Small left pleural effusion. 3. Aortic atherosclerosis, in addition to 2 vessel coronary artery disease. Please note that although the presence of coronary artery calcium documents the presence of coronary artery  disease, the severity of this disease and any potential stenosis cannot be assessed on this non-gated CT examination. Assessment for potential risk factor modification, dietary therapy or pharmacologic therapy may be warranted, if clinically indicated. 4. Cholelithiasis. Electronically Signed   By: Vinnie Langton M.D.   On: 04/25/2016 18:59   US Paracentesis  Result Date: 04/26/2016 INDICATION: Patient with abdominal pain and large complex solid/ cystic adnexal mass, ascites, omental thickening concerning for ovarian cancer; request received for diagnostic and therapeutic paracentesis. EXAM: ULTRASOUND GUIDED DIAGNOSTIC AND THERAPEUTIC PARACENTESIS MEDICATIONS: None. COMPLICATIONS: None immediate. PROCEDURE: Informed written consent was obtained from the patient after a discussion of the risks, benefits and alternatives to treatment. A timeout was performed prior to the initiation of the procedure. Initial ultrasound scanning demonstrates a moderate amount of ascites within the left mid to lower abdominal quadrant. The left mid to lower abdomen was prepped and draped in the usual sterile fashion. 1% lidocaine was used for local anesthesia. Following this, a Yueh catheter was introduced. An ultrasound image was saved for documentation purposes. The paracentesis was performed. The catheter was removed and a dressing was applied. The patient tolerated the procedure well without immediate post procedural complication. FINDINGS: A total of approximately 2.5 liters of  turbid, bloody fluid was removed. Samples were sent to the laboratory as requested by the clinical team. IMPRESSION: Successful ultrasound-guided diagnostic and therapeutic paracentesis yielding 2.5 liters of peritoneal fluid. Read by: Rowe Robert, PA-C Electronically Signed   By: Corrie Mckusick D.O.   On: 04/26/2016 13:58   Ct Biopsy  Result Date: 05/01/2016 INDICATION: LARGE ABDOMINOPELVIC COMPLEX CYSTIC MASS INVOLVING THE OMENTUM EXAM: CT-GUIDED  BIOPSY ANTERIOR OMENTAL MASS MEDICATIONS: 1% LIDOCAINE LOCALLY ANESTHESIA/SEDATION: 1.0 mg IV Versed; 25 mcg IV Fentanyl Moderate Sedation Time:  13 minutes The patient was continuously monitored during the procedure by the interventional radiology nurse under my direct supervision. PROCEDURE: The procedure, risks, benefits, and alternatives were explained to the patient. Questions regarding the procedure were encouraged and answered. The patient understands and consents to the procedure. Previous imaging reviewed. Patient positioned supine. Noncontrast localization CT performed. The large abdominopelvic mass involving the omentum was localized. Omental component was marked in the anterior abdomen just above the umbilicus. Under sterile conditions and local anesthesia, a 17 gauge coaxial guide needle was advanced into the anterior omental mass. Needle position confirmed with CT. 18 gauge core biopsies obtained. These were placed in formalin. Patient tolerated the procedure well without complication. Vital sign monitoring by nursing staff during the procedure will continue as patient is in the special procedures unit for post procedure observation. FINDINGS: The images document guide needle placement within the anterior omental mass. Post biopsy images demonstrate no hemorrhage or hematoma. COMPLICATIONS: None immediate. IMPRESSION: Successful CT-guided anterior omental mass 18 gauge core biopsy Electronically Signed   By: Jerilynn Mages.  Shick M.D.   On: 05/01/2016 15:17   Dg Chest Port 1 View  Result Date: 04/25/2016 CLINICAL DATA:  Abdominal distension with fever EXAM: PORTABLE CHEST 1 VIEW COMPARISON:  None. FINDINGS: Low lung volumes. Non inclusion of the left CP angle. Borderline to mild cardiomegaly, exaggerated by low lung volume. Mild atherosclerosis. No focal consolidation. No pneumothorax. IMPRESSION: Low lung volumes, without infiltrate or edema. Borderline to mild cardiomegaly. Electronically Signed   By: Donavan Foil M.D.   On: 04/25/2016 17:54   Ir Paracentesis  Result Date: 04/28/2016 INDICATION: Patient with ascites. Request is made for diagnostic and therapeutic paracentesis. EXAM: ULTRASOUND GUIDED DIAGNOSTIC AND THERAPEUTIC PARACENTESIS MEDICATIONS: 10 mL 1% lidocaine COMPLICATIONS: None immediate. PROCEDURE: Informed written consent was obtained from the patient after a discussion of the risks, benefits and alternatives to treatment. A timeout was performed prior to the initiation of the procedure. Initial ultrasound scanning demonstrates a large amount of ascites within the right lateral abdomen. The right lateral abdomen was prepped and draped in the usual sterile fashion. 1% lidocaine was used for local anesthesia. Following this, a 19 gauge, 7-cm, Yueh catheter was introduced. An ultrasound image was saved for documentation purposes. The paracentesis was performed. The catheter was removed and a dressing was applied. The patient tolerated the procedure well without immediate post procedural complication. FINDINGS: A total of approximately 530 mL of thick, amber fluid was removed. Samples were sent to the laboratory as requested by the clinical team. IMPRESSION: Successful ultrasound-guided diagnostic and therapeutic paracentesis yielding 530 liters of peritoneal fluid. Read by:  Brynda Greathouse PA-C Electronically Signed   By: Corrie Mckusick D.O.   On: 04/28/2016 13:59     LOS: 10 days   Signature  Lala Lund M.D on 05/05/2016 at 10:22 AM  Between 7am to 7pm - Pager - [phone] ( page via [email or web address], text pages only, please mention full 10 digit call  back number).  After 7pm go to [email or web address] - password [facility]

## 2016-05-05 NOTE — Progress Notes (Signed)
START ON PATHWAY REGIMEN - Ovarian     A cycle is every 21 days:     Paclitaxel      Carboplatin   **Always confirm dose/schedule in your pharmacy ordering system**    Patient Characteristics: Neoadjuvant Therapy (No Primary Cytoreductive Surgery) AJCC T Category: T3 AJCC N Category: NX AJCC M Category: M0 AJCC 8 Stage Grouping: Unknown Line of Therapy: Neoadjuvant Therapy BRCA Mutation Status: Did Not Order Test Would you be surprised if this patient died  in the next year? I would be surprised if this patient died in the next year  Intent of Therapy: Curative Intent, Not Discussed with Patient 

## 2016-05-05 NOTE — Progress Notes (Signed)
ANTICOAGULATION CONSULT NOTE   Pharmacy Consult for Lovenox Indication: superficial thrombosis in the left small saphenous vein  Allergies  Allergen Reactions  . Pseudoephedrine Other (See Comments)    "makes me loopy"    Patient Measurements: Height: 5\' 5"  (165.1 cm) Weight: 206 lb (93.4 kg) IBW/kg (Calculated) : 57  Vital Signs: Temp: 98 F (36.7 C) (05/01 0511) Temp Source: Oral (05/01 0511) BP: 154/47 (05/01 0511) Pulse Rate: 78 (05/01 0511)  Labs:  Recent Labs  05/03/16 0557 05/04/16 0548 05/05/16 0339  HGB 7.9* 8.2*  --   HCT 26.2* 28.0*  --   PLT 348 357  --   CREATININE 0.62 0.61 0.63    Estimated Creatinine Clearance: 84.5 mL/min (by C-G formula based on SCr of 0.63 mg/dL).   Medical History: Past Medical History:  Diagnosis Date  . Ovarian cancer ([place]) 04/25/2016    Assessment: 61 y/o female with MSSA peritonitis and bacteremia on cefazolin therapy. Also with possible ovarian cancer, biopsy results pending. Currently on Lovenox 1.5 mg/kg every 24 hours for superficial thrombosis in the left small saphenous vein by BLE duplex.  SCr remains WNL and CBC from 4/30 was stable with no reported bleeding.   Goal of Therapy:  Monitor platelets by anticoagulation protocol: Yes   Plan:  - Continue Lovenox 140 mg SQ q24h (1.5 mg/kg/d) - CBC q72h while on Lovenox - Monitor for s/sx of bleeding   Vincenza Hews, PharmD, BCPS 05/05/2016, 8:17 AM Phone for today - [place] - [phone]

## 2016-05-06 DIAGNOSIS — C561 Malignant neoplasm of right ovary: Secondary | ICD-10-CM

## 2016-05-06 LAB — GLUCOSE, CAPILLARY
GLUCOSE-CAPILLARY: 188 mg/dL — AB (ref 65–99)
Glucose-Capillary: 108 mg/dL — ABNORMAL HIGH (ref 65–99)

## 2016-05-06 MED ORDER — HEPARIN SOD (PORK) LOCK FLUSH 100 UNIT/ML IV SOLN
250.0000 [IU] | INTRAVENOUS | Status: AC | PRN
  Administered 2016-05-06: 250 [IU]

## 2016-05-06 MED ORDER — ASPIRIN EC 81 MG PO TBEC: 81.0000 mg | DELAYED_RELEASE_TABLET | Freq: Two times a day (BID) | ORAL | 0 refills | Status: AC

## 2016-05-06 MED ORDER — PANTOPRAZOLE SODIUM 40 MG PO TBEC: 40.0000 mg | DELAYED_RELEASE_TABLET | Freq: Every day | ORAL | Status: AC

## 2016-05-06 MED ORDER — CYANOCOBALAMIN 1000 MCG PO TABS: 1000.0000 ug | ORAL_TABLET | Freq: Every day | ORAL | Status: AC

## 2016-05-06 MED ORDER — SPIRONOLACTONE 25 MG PO TABS: 25.0000 mg | ORAL_TABLET | Freq: Every day | ORAL | Status: DC

## 2016-05-06 MED ORDER — METOPROLOL TARTRATE 25 MG PO TABS: 25.0000 mg | ORAL_TABLET | Freq: Two times a day (BID) | ORAL | Status: AC

## 2016-05-06 MED ORDER — POLYETHYLENE GLYCOL 3350 17 G PO PACK: 17.0000 g | PACK | Freq: Every day | ORAL | 0 refills | Status: AC

## 2016-05-06 MED ORDER — BOOST / RESOURCE BREEZE PO LIQD: 1.0000 | Freq: Three times a day (TID) | ORAL | 0 refills | Status: AC

## 2016-05-06 MED ORDER — FERROUS SULFATE 325 (65 FE) MG PO TABS: 325.0000 mg | ORAL_TABLET | Freq: Two times a day (BID) | ORAL | 3 refills | Status: AC

## 2016-05-06 MED ORDER — OXYCODONE HCL 5 MG PO TABS: 5.0000 mg | ORAL_TABLET | Freq: Four times a day (QID) | ORAL | 0 refills | Status: AC | PRN

## 2016-05-06 MED ORDER — POTASSIUM CHLORIDE CRYS ER 20 MEQ PO TBCR: 20.0000 meq | EXTENDED_RELEASE_TABLET | Freq: Every day | ORAL | Status: DC

## 2016-05-06 MED ORDER — FUROSEMIDE 40 MG PO TABS: 40.0000 mg | ORAL_TABLET | Freq: Two times a day (BID) | ORAL | Status: AC

## 2016-05-06 MED ORDER — CEFAZOLIN SODIUM-DEXTROSE 2-4 GM/100ML-% IV SOLN: 2.0000 g | Freq: Three times a day (TID) | INTRAVENOUS | 0 refills | Status: DC

## 2016-05-06 NOTE — Discharge Summary (Addendum)
Allison Haynes ZDG:[id number] DOB: 22-Feb-1955 DOA: 04/25/2016  PCP: No primary care provider on file.  Admit date: 04/25/2016  Discharge date: 05/06/2016  Admitted From: Home  Disposition:  SNF   Recommendations for Outpatient Follow-up:   Follow up with PCP in 1-2 weeks  PCP Please obtain BMP/CBC, 2 view CXR in 1week,  (see Discharge instructions)   PCP Please follow up on the following pending results: follow final biopsy results   Home Health: None Equipment/Devices: None  Consultations: Oncology, ID, GYN Onco Discharge Condition: Fair   CODE STATUS: Full Diet Recommendation: Heart Healthy with 1.5lit/day fluid restriction   Chief Complaint  Patient presents with  . Abdominal Pain     Brief history of present illness from the day of admission and additional interim summary    Patient is a 61 y.o. female with no significant medical history-presented to the ED on 4/21 with worsening abdominal pain,distention and fever. CT scan of the abdomen showed a large complex cystic/solid mass emanating from one of both of the adnexal regions with peritoneal/omental caking and potential malignant ascites. She underwent paracentesis 2, with negative cytology. Her initial paracentesis on 4/22, showed gram-positive cocci, but cultures were negative. Blood cultures done on admission was positive for MSSA. 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 Thankfully, with empiric antimicrobial therapy-sepsis pathophysiology has resolved, and she is slowly improving. Please note, hospital course has been complicated by a brief episode of A. fib RVR,  multifactorial anemia requiring PRBC 3, and volume overload. See below for further details.  Note: Patient has not seen a physician or sought routine medical in the outpatient setting for approximately 25 years!                                                                  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 Blood cultures 1 out of 2 drawn on 04/25/2016 positive for MSSA. Echocardiogram does not show any signs of vegetations. Case discussed with Dr. Johnnye Sima on 05/05/2016 she will receive a PICC line for 2 more weeks via PICC at SNF.  She underwent CT-guided omental biopsy by IR on 05/01/2016 to correctly diagnose the etiology of her omental mass as paratoneal and fluid cytology 2 remained negative. 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 Onc on board  will follow the patient in 1-2 weeks and do outpt Chemo via her PICC line, they will follow final pathology results.   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 Unfortunately, cytology negative 2- even though CA 125 elevated, she underwent omental biopsy on 05/01/2016 follow cytology as above. Continue diuresis.  Paroxysmal atrial fibrillation [place] vasc 2 score off 1. Currently stable on oral beta blocker, echo shows preserved EF with grade 2 chronic diastolic CHF. Currently on Lopressor and ASA.  Left small saphenous vein SVT. Discussed with Onc Dr Alvy Bimler ASA 81 BID and  monitor clinically.   Significant volume overload with lower extremity edema: The combination of adnexal mass and omental metastases along with possible acute on chronic diastolic CHF. Continue diuresis, unfortunately venous ultrasound shows right superficial SVT in the lower extremity. Apply Unna boots, ASA, diurese and monitor.  Anemia of chronic disease: Suspect mostly has anemia of chronic disease with elements of vitamin B12 deficiency and some iron deficiency (soluble transferrin receptor assay slightly increased). Anemia has worsened due to critical illness as well. She has required 3 units of PRBC so far, she has been started on vitamin B12 and iron supplementation. Trend hemoglobin periodically. No signs of ongoing GI blood loss, and oral PPI.  Minimally elevated Troponin level:Likely secondary to demand ischemia-doubt ACS. Given issues with malignancy.severity of anemia-doubtfurther workup needed at this time. Echo showed preserved EF without any wall motion abnormalities  Left arm ecchymosis: Occurred when IV infiltrated when she was receiving of PRBC transfusion. This appears stable-ecchymosis mostly appears to be very superficial.  Thrombocytosis: Likely reactive-due to underlying malignancy/chronic inflammation.  Moderate protein calorie malnutrition: Seen by nutrition, continue supplements.  Scalp mass: Per patient is has been there for the past 12 years-see pictures below. I do not think this is related to her presentation. She will likely require a biopsy-at some point-probably as an outpatient. See pictures below.      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[facility])    Discharge instructions    Discharge Instructions    Discharge instructions     Complete by:  As directed    Follow with Primary MD or SNF MD in 7 days   Get CBC, CMP, 2 view Chest X ray checked  by Primary MD or SNF MD in 5-7 days ( we routinely change or add medications that can affect your baseline labs and fluid status, therefore we recommend that you get the mentioned basic workup next visit with your PCP, your PCP may decide not to get them or add new tests based on their clinical decision)  Activity: As tolerated with Full fall precautions use walker/cane & assistance as needed  Disposition Home   Diet:  Heart Healthy , 1.5 lit/day fluid restriction  For Heart failure patients - Check your Weight same time everyday, if you gain over 2 pounds, or you develop in leg swelling, experience more shortness of breath or chest pain, call your Primary MD immediately. Follow Cardiac Low Salt Diet and 1.5 lit/day fluid restriction.  On your next visit with your primary care physician please Get Medicines reviewed and adjusted.  Please request your Prim.MD  to go over all Hospital Tests and Procedure/Radiological results at the follow up, please get all Hospital records sent to your Prim MD by signing hospital release before you go home.  If you experience worsening of your admission symptoms, develop shortness of breath, life threatening emergency, suicidal or homicidal thoughts you must seek medical attention immediately by calling 911 or calling your MD immediately  if symptoms less severe.  You Must read complete instructions/literature along with all the possible adverse reactions/side effects for all the Medicines you take and that have been prescribed to you. Take any new Medicines after you have completely understood and accpet all the possible adverse reactions/side effects.   Do not drive, operate heavy machinery, perform activities at heights, swimming or participation in water activities or provide baby sitting services if your were admitted for syncope or siezures  until you have seen by Primary MD or a Neurologist and advised to do so again.  Do not drive when taking Pain medications.    Do not take more than prescribed Pain, Sleep and Anxiety Medications  Special Instructions: If you have smoked or chewed Tobacco  in the last 2 yrs please stop smoking, stop any regular Alcohol  and or any Recreational drug use.  Wear Seat belts while driving.   Please note  You were cared for by a hospitalist during your hospital stay. If you have any questions about your discharge medications or the care you received while you were in the hospital after you are discharged, you can call the unit and asked to speak with the hospitalist on call if the hospitalist that took care of you is not available. Once you are discharged, your primary care physician will handle any further medical issues. Please note that NO REFILLS for any discharge medications will be authorized once you are discharged, as it is imperative that you return to your primary care physician (or establish a relationship with a primary care physician if you do not have one) for your aftercare needs so that they can reassess your need for medications and monitor your lab values.   Increase activity slowly    Complete by:  As directed       Discharge Medications   Allergies as of 05/06/2016      Reactions   Pseudoephedrine Other (See Comments)   "makes me loopy"      Medication List    TAKE these medications   aspirin EC 81 MG tablet Take 1 tablet (81 mg total) by mouth 2 (two) times daily.   ceFAZolin 2-4 GM/100ML-% IVPB Commonly known as:  ANCEF Inject 100 mLs (2 g total) into the vein every 8 (eight) hours. Stop date 05-20-16   cyanocobalamin 1000 MCG tablet Take 1 tablet (1,000 mcg total) by mouth daily.   feeding supplement Liqd Take 1 Container by mouth 3 (three) times daily between meals.   ferrous sulfate 325 (65 FE) MG tablet Take 1 tablet (325 mg total) by mouth 2 (two) times  daily with a meal.   furosemide 40 MG tablet Commonly known as:  LASIX Take 1 tablet (40 mg total) by mouth 2 (two) times daily.   metoprolol tartrate 25 MG tablet Commonly known as:  LOPRESSOR Take 1 tablet (25 mg total) by mouth 2 (two) times daily.   oxyCODONE 5 MG immediate release tablet Commonly known as:  Oxy IR/ROXICODONE Take 1 tablet (5 mg total) by mouth every 6 (six) hours as needed for moderate pain.   pantoprazole  40 MG tablet Commonly known as:  PROTONIX Take 1 tablet (40 mg total) by mouth daily.   polyethylene glycol packet Commonly known as:  MIRALAX / GLYCOLAX Take 17 g by mouth daily.   potassium chloride SA 20 MEQ tablet Commonly known as:  K-DUR,KLOR-CON Take 1 tablet (20 mEq total) by mouth daily.   spironolactone 25 MG tablet Commonly known as:  ALDACTONE Take 1 tablet (25 mg total) by mouth daily.       Follow-up Information    Heath Lark, MD. Schedule an appointment as soon as possible for a visit in 1 week(s).   Specialty:  Hematology and Oncology Contact information: [place] [id number] [phone]        Bobby Rumpf, MD. Schedule an appointment as soon as possible for a visit in 1 week(s).   Specialty:  Infectious Diseases Contact information: [address] [place] [address] [phone]           Major procedures and Radiology Reports - PLEASE review detailed and final reports thoroughly  -     Bilateral lower extremity venous duplexcompleted. Preliminary report: There is no DVT noted in the bilateral lower extremities. There is superficial thrombosis noted in the left small saphenous vein. There is a small Baker's cyst noted in the right popliteal fossa.   US paracentesis - Successful ultrasound-guided diagnostic and therapeutic paracentesis yielding 530 liters of peritoneal fluid.  CT guided - omental biopsy 05-02-16 -   follow final results   Ct Angio Chest Pe W  Or Wo Contrast  Result Date: 04/27/2016 CLINICAL DATA:  61 year old female with history of ovarian cancer presenting with tachycardia. EXAM: CT ANGIOGRAPHY CHEST WITH CONTRAST TECHNIQUE: Multidetector CT imaging of the chest was performed using the standard protocol during bolus administration of intravenous contrast. Multiplanar CT image reconstructions and MIPs were obtained to evaluate the vascular anatomy. CONTRAST:  100 cc Isovue 370 COMPARISON:  Chest radiograph dated 04/25/2016 and CT of the abdomen pelvis dated 04/25/2016 FINDINGS: Cardiovascular: There is mild cardiomegaly. No pericardial effusion. There is coronary vascular calcification involving the LAD. The thoracic aorta appears unremarkable. The origins of the great vessels of the aortic arch appear patent. There is dilatation of the main pulmonary trunk suggestive of underlying pulmonary hypertension. Evaluation of the pulmonary arteries is limited due to suboptimal opacification of the peripheral branches. No central pulmonary artery embolus identified. Mediastinum/Nodes: There is no hilar or mediastinal adenopathy. The esophagus and the thyroid gland are grossly unremarkable. Lungs/Pleura: There is a small left pleural effusion. There are consolidative changes of the left lower lobe as well as scattered densities in the left upper lobe. Overall there is volume loss in the left lung with shift of the heart into the left hemithorax. There is segmental air trapping in the right middle lobe with diffuse hazy airspace density in the right upper and right lower lobes. Paramediastinal subpleural density in the right upper lobe likely represents atelectatic changes or pleural thickening. There is no pneumothorax. Upper Abdomen: Ascites is partially visualized. Musculoskeletal: Multilevel degenerative changes of the spine. No acute osseous pathology. Review of the MIP images confirms the above findings. IMPRESSION: 1. No CT evidence of pulmonary artery  embolus. 2. Areas of consolidative change involving the left lower lobe as well as scattered pulmonary densities in the left upper lobe. Findings may represent pneumonia. Correlation with clinical exam recommended. 3. Overall decreased left lung volume with leftward shift of the mediastinum and heart. 4.  Segmental air trapping in the right middle lobe with diffuse haziness of the right upper and right lower lobes. 5. Ascites. Electronically Signed   By: Anner Crete M.D.   On: 04/27/2016 05:40   Ct Abdomen Pelvis W Contrast  Result Date: 04/25/2016 CLINICAL DATA:  61 year old female with history of abdominal distension and nausea. No recent vomiting. EXAM: CT ABDOMEN AND PELVIS WITH CONTRAST TECHNIQUE: Multidetector CT imaging of the abdomen and pelvis was performed using the standard protocol following bolus administration of intravenous contrast. CONTRAST:  1 ISOVUE-300 IOPAMIDOL (ISOVUE-300) INJECTION 61% COMPARISON:  No priors. FINDINGS: Lower chest: Air trapping right middle lobe. Small left pleural effusion. Atherosclerotic calcifications in the left anterior descending and left circumflex coronary arteries. Hepatobiliary: No suspicious cystic or solid hepatic lesions. No intra or extrahepatic biliary ductal dilatation. Small calcified gallstones lying dependently in the gallbladder. No findings to suggest an acute cholecystitis at this time. Pancreas: No pancreatic mass. No pancreatic ductal dilatation. No pancreatic or peripancreatic fluid or inflammatory changes. Spleen: Unremarkable. Adrenals/Urinary Tract: Right kidney and bilateral adrenal glands are normal in appearance. 1.9 cm simple cyst in the interpolar region of the left kidney. No hydroureteronephrosis. Urinary bladder is normal in appearance. Stomach/Bowel: The appearance of the stomach is normal. There is no pathologic dilatation of small bowel or colon. Appendix is not confidently visualized. Vascular/Lymphatic: No significant  atherosclerotic disease, aneurysm or dissection identified in the abdominal or pelvic vasculature. No lymphadenopathy noted in the abdomen or pelvis. Reproductive: Large complex mixed cystic and solid mass which emanates from one or both of the adnexal regions, highly concerning for ovarian neoplasm. This measures approximately 20.5 x 23.1 x 24.6 cm (coronal image 46 of series 6 and sagittal image 65 of series 7), and has mixed cystic and solid components, with some heterogeneous internal calcification. Uterus is also heterogeneous in appearance, with multiple densely calcified lesions, presumably multiple fibroids, largest of which measures 5.5 cm in the anterior aspect of the fundal region. Other: Large volume of ascites. Extensive nodular soft tissue thickening throughout the omentum, compatible with omental caking. Some of this extends through the umbilical region. No pneumoperitoneum. Musculoskeletal: There are no aggressive appearing lytic or blastic lesions noted in the visualized portions of the skeleton. IMPRESSION: 1. 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 Consultation with Gynecology is recommended. 2. Small left pleural effusion. 3. Aortic atherosclerosis, in addition to 2 vessel coronary artery disease. Please note that although the presence of coronary artery calcium documents the presence of coronary artery disease, the severity of this disease and any potential stenosis cannot be assessed on this non-gated CT examination. Assessment for potential risk factor modification, dietary therapy or pharmacologic therapy may be warranted, if clinically indicated. 4. Cholelithiasis. Electronically Signed   By: Vinnie Langton M.D.   On: 04/25/2016 18:59   US Paracentesis  Result Date: 04/26/2016 INDICATION: Patient with abdominal pain and large complex solid/ cystic adnexal mass, ascites, omental thickening  concerning for ovarian cancer; request received for diagnostic and therapeutic paracentesis. EXAM: ULTRASOUND GUIDED DIAGNOSTIC AND THERAPEUTIC PARACENTESIS MEDICATIONS: None. COMPLICATIONS: None immediate. PROCEDURE: Informed written consent was obtained from the patient after a discussion of the risks, benefits and alternatives to treatment. A timeout was performed prior to the initiation of the procedure. Initial ultrasound scanning demonstrates a moderate amount of ascites within the left mid to lower abdominal quadrant. The left mid to lower abdomen was prepped and draped in the usual sterile  fashion. 1% lidocaine was used for local anesthesia. Following this, a Yueh catheter was introduced. An ultrasound image was saved for documentation purposes. The paracentesis was performed. The catheter was removed and a dressing was applied. The patient tolerated the procedure well without immediate post procedural complication. FINDINGS: A total of approximately 2.5 liters of turbid, bloody fluid was removed. Samples were sent to the laboratory as requested by the clinical team. IMPRESSION: Successful ultrasound-guided diagnostic and therapeutic paracentesis yielding 2.5 liters of peritoneal fluid. Read by: Rowe Robert, PA-C Electronically Signed   By: Corrie Mckusick D.O.   On: 04/26/2016 13:58   Ct Biopsy  Result Date: 05/01/2016 INDICATION: LARGE ABDOMINOPELVIC COMPLEX CYSTIC MASS INVOLVING THE OMENTUM EXAM: CT-GUIDED BIOPSY ANTERIOR OMENTAL MASS MEDICATIONS: 1% LIDOCAINE LOCALLY ANESTHESIA/SEDATION: 1.0 mg IV Versed; 25 mcg IV Fentanyl Moderate Sedation Time:  13 minutes The patient was continuously monitored during the procedure by the interventional radiology nurse under my direct supervision. PROCEDURE: The procedure, risks, benefits, and alternatives were explained to the patient. Questions regarding the procedure were encouraged and answered. The patient understands and consents to the procedure. Previous  imaging reviewed. Patient positioned supine. Noncontrast localization CT performed. The large abdominopelvic mass involving the omentum was localized. Omental component was marked in the anterior abdomen just above the umbilicus. Under sterile conditions and local anesthesia, a 17 gauge coaxial guide needle was advanced into the anterior omental mass. Needle position confirmed with CT. 18 gauge core biopsies obtained. These were placed in formalin. Patient tolerated the procedure well without complication. Vital sign monitoring by nursing staff during the procedure will continue as patient is in the special procedures unit for post procedure observation. FINDINGS: The images document guide needle placement within the anterior omental mass. Post biopsy images demonstrate no hemorrhage or hematoma. COMPLICATIONS: None immediate. IMPRESSION: Successful CT-guided anterior omental mass 18 gauge core biopsy Electronically Signed   By: Jerilynn Mages.  Shick M.D.   On: 05/01/2016 15:17   Dg Chest Port 1 View  Result Date: 04/25/2016 CLINICAL DATA:  Abdominal distension with fever EXAM: PORTABLE CHEST 1 VIEW COMPARISON:  None. FINDINGS: Low lung volumes. Non inclusion of the left CP angle. Borderline to mild cardiomegaly, exaggerated by low lung volume. Mild atherosclerosis. No focal consolidation. No pneumothorax. IMPRESSION: Low lung volumes, without infiltrate or edema. Borderline to mild cardiomegaly. Electronically Signed   By: Donavan Foil M.D.   On: 04/25/2016 17:54   Ir Paracentesis  Result Date: 04/28/2016 INDICATION: Patient with ascites. Request is made for diagnostic and therapeutic paracentesis. EXAM: ULTRASOUND GUIDED DIAGNOSTIC AND THERAPEUTIC PARACENTESIS MEDICATIONS: 10 mL 1% lidocaine COMPLICATIONS: None immediate. PROCEDURE: Informed written consent was obtained from the patient after a discussion of the risks, benefits and alternatives to treatment. A timeout was performed prior to the initiation of the  procedure. Initial ultrasound scanning demonstrates a large amount of ascites within the right lateral abdomen. The right lateral abdomen was prepped and draped in the usual sterile fashion. 1% lidocaine was used for local anesthesia. Following this, a 19 gauge, 7-cm, Yueh catheter was introduced. An ultrasound image was saved for documentation purposes. The paracentesis was performed. The catheter was removed and a dressing was applied. The patient tolerated the procedure well without immediate post procedural complication. FINDINGS: A total of approximately 530 mL of thick, amber fluid was removed. Samples were sent to the laboratory as requested by the clinical team. IMPRESSION: Successful ultrasound-guided diagnostic and therapeutic paracentesis yielding 530 liters of peritoneal fluid. Read by:  Brynda Greathouse PA-C  Electronically Signed   By: Corrie Mckusick D.O.   On: 04/28/2016 13:59    Micro Results     Recent Results (from the past 240 hour(s))  Culture, body fluid-bottle     Status: Abnormal   Collection Time: 04/26/16  1:36 PM  Result Value Ref Range Status   Specimen Description FLUID  Final   Special Requests PERITONEAL  Final   Gram Stain   Final    GRAM POSITIVE COCCI IN CLUSTERS ANAEROBIC BOTTLE ONLY CRITICAL RESULT CALLED TO, READ BACK BY AND VERIFIED WITH: C. STATEN RN, AT 1347 04/30/16 BY D. VANHOOK    Culture STAPHYLOCOCCUS AUREUS (A)  Final   Report Status 05/02/2016 FINAL  Final   Organism ID, Bacteria STAPHYLOCOCCUS AUREUS  Final      Susceptibility   Staphylococcus aureus - MIC*    CIPROFLOXACIN <=0.5 SENSITIVE Sensitive     ERYTHROMYCIN <=0.25 SENSITIVE Sensitive     GENTAMICIN <=0.5 SENSITIVE Sensitive     OXACILLIN <=0.25 SENSITIVE Sensitive     TETRACYCLINE <=1 SENSITIVE Sensitive     VANCOMYCIN 1 SENSITIVE Sensitive     TRIMETH/SULFA <=10 SENSITIVE Sensitive     CLINDAMYCIN <=0.25 SENSITIVE Sensitive     RIFAMPIN <=0.5 SENSITIVE Sensitive     Inducible  Clindamycin NEGATIVE Sensitive     * STAPHYLOCOCCUS AUREUS  Gram stain     Status: None   Collection Time: 04/26/16  1:36 PM  Result Value Ref Range Status   Specimen Description FLUID  Final   Special Requests PERITONEAL  Final   Gram Stain   Final    WBC PRESENT, PREDOMINANTLY PMN GRAM POSITIVE COCCI IN CLUSTERS IN PAIRS    Report Status 04/26/2016 FINAL  Final  Culture, blood (Routine X 2) w Reflex to ID Panel     Status: None   Collection Time: 04/28/16  4:14 AM  Result Value Ref Range Status   Specimen Description BLOOD LEFT ARM  Final   Special Requests AEROBIC BOTTLE ONLY Blood Culture adequate volume  Final   Culture NO GROWTH 5 DAYS  Final   Report Status 05/03/2016 FINAL  Final  Culture, body fluid-bottle     Status: None   Collection Time: 04/28/16 11:40 AM  Result Value Ref Range Status   Specimen Description FLUID PERITONEAL  Final   Special Requests NONE  Final   Culture NO GROWTH 5 DAYS  Final   Report Status 05/03/2016 FINAL  Final  Gram stain     Status: None   Collection Time: 04/28/16 11:40 AM  Result Value Ref Range Status   Specimen Description FLUID PERITONEAL  Final   Special Requests NONE  Final   Gram Stain   Final    ABUNDANT WBC PRESENT,BOTH PMN AND MONONUCLEAR NO ORGANISMS SEEN    Report Status 04/29/2016 FINAL  Final    Today   Subjective    Allison Haynes today has no headache,no chest abdominal pain,no new weakness tingling or numbness, feels much better wants to go home today.     Objective   Blood pressure 121/61, pulse 77, temperature 98.1 F (36.7 C), temperature source Oral, resp. rate 16, height 5\' 5"  (1.651 m), weight 95.3 kg (210 lb 1.6 oz), SpO2 97 %.   Intake/Output Summary (Last 24 hours) at 05/06/16 0801 Last data filed at 05/06/16 0211  Gross per 24 hour  Intake              720 ml  Output  1000 ml  Net             -280 ml    Exam Awake Alert, Oriented x 3, No new F.N deficits, Normal  affect [place].AT,PERRAL Supple Neck,No JVD, No cervical lymphadenopathy appriciated.  Symmetrical Chest wall movement, Good air movement bilaterally, CTAB RRR,No Gallops,Rubs or new Murmurs, No Parasternal Heave +ve B.Sounds, Abd diatended, Non tender, No organomegaly appriciated, No rebound -guarding or rigidity. No Cyanosis, Clubbing or edema, No new Rash , stable L arm bruise, R Arm PICC, Scalp mass stable  Note picture taken on 4/27 after patient's permission.       Data Review   CBC w Diff:  Lab Results  Component Value Date   WBC 17.9 (H) 05/04/2016   HGB 8.2 (L) 05/04/2016   HCT 28.0 (L) 05/04/2016   PLT 357 05/04/2016   LYMPHOPCT 5 05/02/2016   BANDSPCT 22 04/25/2016   MONOPCT 4 05/02/2016   EOSPCT 1 05/02/2016   BASOPCT 0 05/02/2016    CMP:  Lab Results  Component Value Date   NA 133 (L) 05/05/2016   K 3.7 05/05/2016   CL 99 (L) 05/05/2016   CO2 28 05/05/2016   BUN 20 05/05/2016   CREATININE 0.63 05/05/2016   PROT 5.3 (L) 05/02/2016   ALBUMIN 1.8 (L) 05/02/2016   BILITOT 0.2 (L) 05/02/2016   ALKPHOS 84 05/02/2016   AST 26 05/02/2016   ALT 7 (L) 05/02/2016  .   Total Time in preparing paper work, data evaluation and todays exam - 74 minutes  Lala Lund M.D on 05/06/2016 at 8:01 AM  [facility]   Office  [phone]

## 2016-05-06 NOTE — Discharge Instructions (Signed)
Follow with Primary MD or SNF MD in 7 days   Get CBC, CMP, 2 view Chest X ray checked  by Primary MD or SNF MD in 5-7 days ( we routinely change or add medications that can affect your baseline labs and fluid status, therefore we recommend that you get the mentioned basic workup next visit with your PCP, your PCP may decide not to get them or add new tests based on their clinical decision)  Activity: As tolerated with Full fall precautions use walker/cane & assistance as needed  Disposition Home   Diet:  Heart Healthy , 1.5 lit/day fluid restriction  For Heart failure patients - Check your Weight same time everyday, if you gain over 2 pounds, or you develop in leg swelling, experience more shortness of breath or chest pain, call your Primary MD immediately. Follow Cardiac Low Salt Diet and 1.5 lit/day fluid restriction.  On your next visit with your primary care physician please Get Medicines reviewed and adjusted.  Please request your Prim.MD to go over all Hospital Tests and Procedure/Radiological results at the follow up, please get all Hospital records sent to your Prim MD by signing hospital release before you go home.  If you experience worsening of your admission symptoms, develop shortness of breath, life threatening emergency, suicidal or homicidal thoughts you must seek medical attention immediately by calling 911 or calling your MD immediately  if symptoms less severe.  You Must read complete instructions/literature along with all the possible adverse reactions/side effects for all the Medicines you take and that have been prescribed to you. Take any new Medicines after you have completely understood and accpet all the possible adverse reactions/side effects.   Do not drive, operate heavy machinery, perform activities at heights, swimming or participation in water activities or provide baby sitting services if your were admitted for syncope or siezures until you have seen by Primary MD  or a Neurologist and advised to do so again.  Do not drive when taking Pain medications.    Do not take more than prescribed Pain, Sleep and Anxiety Medications  Special Instructions: If you have smoked or chewed Tobacco  in the last 2 yrs please stop smoking, stop any regular Alcohol  and or any Recreational drug use.  Wear Seat belts while driving.   Please note  You were cared for by a hospitalist during your hospital stay. If you have any questions about your discharge medications or the care you received while you were in the hospital after you are discharged, you can call the unit and asked to speak with the hospitalist on call if the hospitalist that took care of you is not available. Once you are discharged, your primary care physician will handle any further medical issues. Please note that NO REFILLS for any discharge medications will be authorized once you are discharged, as it is imperative that you return to your primary care physician (or establish a relationship with a primary care physician if you do not have one) for your aftercare needs so that they can reassess your need for medications and monitor your lab values.

## 2016-05-06 NOTE — Progress Notes (Signed)
Patient will discharge to [facility] Anticipated discharge date: 5/2 Family notified: Delsa Sale Transportation by  Corey Harold- scheduled for 7:30pm Report #: [phone]  [place] signing off.  Jorge Ny, LCSW Clinical Social Worker [phone]

## 2016-05-06 NOTE — Clinical Social Work Placement (Signed)
   CLINICAL SOCIAL WORK PLACEMENT  NOTE  Date:  05/06/2016  Patient Details  Name: Allison Haynes MRN: [id number] Date of Birth: Oct 30, 1955  Clinical Social Work is seeking post-discharge placement for this patient at the [place] level of care (*CSW will initial, date and re-position this form in  chart as items are completed):  Yes   Patient/family provided with [facility] Work Department's list of facilities offering this level of care within the geographic area requested by the patient (or if unable, by the patient's family).  Yes   Patient/family informed of their freedom to choose among providers that offer the needed level of care, that participate in Medicare, Medicaid or managed care program needed by the patient, have an available bed and are willing to accept the patient.  Yes   Patient/family informed of [facility]'s ownership interest in [facility] and [facility], as well as of the fact that they are under no obligation to receive care at these facilities.  PASRR submitted to EDS on 05/01/16     PASRR number received on 05/01/16     Existing PASRR number confirmed on       FL2 transmitted to all facilities in geographic area requested by pt/family on 05/01/16     FL2 transmitted to all facilities within larger geographic area on       Patient informed that his/her managed care company has contracts with or will negotiate with certain facilities, including the following:        Yes   Patient/family informed of bed offers received.  Patient chooses bed at [facility]     Physician recommends and patient chooses bed at      Patient to be transferred to [facility] on 05/06/16.  Patient to be transferred to facility by [facility]     Patient family notified on 05/06/16 of transfer.  Name of family member notified:  Daughter     PHYSICIAN       Additional Comment:    _______________________________________________ Benard Halsted, [place] 05/06/2016, 4:33 PM

## 2016-05-06 NOTE — Progress Notes (Signed)
CSW following for SNF placement.  CSW spoke with financial counselors regarding Medicaid/disability application- originally plan was to submit application yesterday for [place] but now it is unclear if patient can apply in [place] or if she has to apply in [place] because she owns a house through a trust in [place].  Anticipated that patient will not be eligible for Medicaid per financial counselor- meeting planned with hospital lawyer, patient elder lawyer, and pt dtr at 3:30pm.  Patient still approved for 30 day LOG but [facility] unwilling to accept until Medicaid is determined.  CSW will continue to follow- MD updated  Jorge Ny, LCSW Clinical Social Worker [phone]

## 2016-05-07 ENCOUNTER — Telehealth: Payer: Self-pay | Admitting: Hematology and Oncology

## 2016-05-07 NOTE — Telephone Encounter (Signed)
[facility]   Appointments scheduled per staff message from NG. Left message for patient re next appointments for 5/15 and 5/15 and mailed schedule. Patient given date/time/location/phone number.

## 2016-05-14 ENCOUNTER — Encounter (HOSPITAL_COMMUNITY): Payer: Self-pay | Admitting: Emergency Medicine

## 2016-05-14 ENCOUNTER — Emergency Department (HOSPITAL_COMMUNITY)
Admission: EM | Admit: 2016-05-14 | Discharge: 2016-05-15 | Disposition: A | Discharge status: Home / Self Care | Payer: Medicaid Other | Attending: Emergency Medicine | Admitting: Emergency Medicine

## 2016-05-14 DIAGNOSIS — R7881 Bacteremia: Secondary | ICD-10-CM | POA: Insufficient documentation

## 2016-05-14 DIAGNOSIS — Z7982 Long term (current) use of aspirin: Secondary | ICD-10-CM | POA: Diagnosis not present

## 2016-05-14 DIAGNOSIS — D649 Anemia, unspecified: Secondary | ICD-10-CM | POA: Diagnosis not present

## 2016-05-14 DIAGNOSIS — Z79899 Other long term (current) drug therapy: Secondary | ICD-10-CM | POA: Diagnosis not present

## 2016-05-14 DIAGNOSIS — R180 Malignant ascites: Secondary | ICD-10-CM | POA: Insufficient documentation

## 2016-05-14 DIAGNOSIS — Z8543 Personal history of malignant neoplasm of ovary: Secondary | ICD-10-CM | POA: Diagnosis not present

## 2016-05-14 DIAGNOSIS — R799 Abnormal finding of blood chemistry, unspecified: Secondary | ICD-10-CM | POA: Diagnosis present

## 2016-05-14 LAB — COMPREHENSIVE METABOLIC PANEL
ALT: 9 U/L — AB (ref 14–54)
AST: 19 U/L (ref 15–41)
Albumin: 1.8 g/dL — ABNORMAL LOW (ref 3.5–5.0)
Alkaline Phosphatase: 91 U/L (ref 38–126)
Anion gap: 10 (ref 5–15)
BILIRUBIN TOTAL: 1 mg/dL (ref 0.3–1.2)
BUN: 20 mg/dL (ref 6–20)
CHLORIDE: 97 mmol/L — AB (ref 101–111)
CO2: 26 mmol/L (ref 22–32)
CREATININE: 0.68 mg/dL (ref 0.44–1.00)
Calcium: 8 mg/dL — ABNORMAL LOW (ref 8.9–10.3)
GFR calc Af Amer: >60 mL/min (ref >60)
Glucose, Bld: 122 mg/dL — ABNORMAL HIGH (ref 65–99)
Potassium: 3.6 mmol/L (ref 3.5–5.1)
Sodium: 133 mmol/L — ABNORMAL LOW (ref 135–145)
Total Protein: 6.3 g/dL — ABNORMAL LOW (ref 6.5–8.1)

## 2016-05-14 LAB — CBC
HCT: 24.8 % — ABNORMAL LOW (ref 36.0–46.0)
Hemoglobin: 7 g/dL — ABNORMAL LOW (ref 12.0–15.0)
MCH: 20.5 pg — ABNORMAL LOW (ref 26.0–34.0)
MCHC: 28.2 g/dL — AB (ref 30.0–36.0)
MCV: 72.7 fL — AB (ref 78.0–100.0)
PLATELETS: 352 10*3/uL (ref 150–400)
RBC: 3.41 MIL/uL — ABNORMAL LOW (ref 3.87–5.11)
RDW: 27.2 % — AB (ref 11.5–15.5)
WBC: 15.4 10*3/uL — AB (ref 4.0–10.5)

## 2016-05-14 NOTE — ED Notes (Signed)
Denies fever, N/V/D.

## 2016-05-14 NOTE — ED Triage Notes (Signed)
Per EMS, patient is coming from [facility] rehab with a hemoglobin of 6.6 and hematocrit 23.  Hx of low hemoglobin, ascites.  Denies CHF, cardiac issues, liver, or kidney issues.  Denies blood in stool.  830 systolic, 735 ST, 2L at 43%, Picc line RUA.

## 2016-05-14 NOTE — ED Notes (Signed)
Patient has PICC single lumen Right Brachial.

## 2016-05-14 NOTE — ED Notes (Signed)
Pt taken off O2 and at 96% RA.

## 2016-05-14 NOTE — ED Notes (Signed)
Unable to pull blood off picc line at this time.

## 2016-05-14 NOTE — ED Notes (Signed)
Phlebotomy at bedside.

## 2016-05-15 LAB — PREPARE RBC (CROSSMATCH)

## 2016-05-15 MED ORDER — SODIUM CHLORIDE 0.9 % IV SOLN
Freq: Once | INTRAVENOUS | Status: AC
  Administered 2016-05-15: 01:00:00 via INTRAVENOUS

## 2016-05-15 MED ORDER — OXYCODONE-ACETAMINOPHEN 5-325 MG PO TABS
1.0000 | ORAL_TABLET | Freq: Once | ORAL | Status: AC
  Administered 2016-05-15: 1 via ORAL
  Filled 2016-05-15: qty 1

## 2016-05-15 MED ORDER — CEFAZOLIN SODIUM-DEXTROSE 2-4 GM/100ML-% IV SOLN
2.0000 g | Freq: Once | INTRAVENOUS | Status: AC
  Administered 2016-05-15: 2 g via INTRAVENOUS
  Filled 2016-05-15: qty 100

## 2016-05-15 NOTE — ED Provider Notes (Signed)
[facility] DEPT Provider Note   CSN: [id number] Arrival date & time: 05/14/16  2025     History   Chief Complaint Chief Complaint  Patient presents with  . Abnormal Lab    HPI Allison Haynes is a 61 y.o. female.  She presents for evaluation of hemoglobin 6.6, recurrent anemia, requiring transfusion.  She is unsure of the source of her anemia.  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  She denies headache, chest pain, abdominal pain, weakness or dizziness.  She is taking her usual medications but missed her nocturnal dose of Ancef, as treatment for bacteremia.  She was discharged from the hospital 10 days ago, after being treated for sepsis/bacteremia, and is currently taking Ancef 3 times daily per PICC line.  She is due to start chemotherapy, 05/20/16.  She has not had any nausea, vomiting, cough.  There are no other no modifying factors.  HPI  Past Medical History:  Diagnosis Date  . Ovarian cancer ([place]) 04/25/2016    Patient Active Problem List   Diagnosis Date Noted  . B12 deficiency anemia 05/05/2016  . 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 Malnutrition of moderate degree 04/27/2016  . Severe malnutrition ([place])   . Sepsis ([place]) 04/25/2016  . Ovarian cancer ([place]) 04/25/2016  . Ascites 04/25/2016  . Microcytic anemia 04/25/2016  . Thrombocytosis ([place]) 04/25/2016  . Hyponatremia 04/25/2016    Past Surgical History:  Procedure Laterality Date  . IR PARACENTESIS  04/28/2016    OB History    No data available       Home Medications    Prior to Admission medications   Medication Sig Start Date End Date Taking? Authorizing Provider  aspirin EC 81 MG tablet Take 1 tablet (81 mg total) by mouth 2 (two) times daily. 05/06/16   Thurnell Lose, MD  ceFAZolin (ANCEF) 2-4 GM/100ML-% IVPB Inject 100 mLs (2 g total) into the vein every 8 (eight) hours. Stop date 05-20-16 05/06/16   Thurnell Lose, MD  feeding supplement (BOOST / RESOURCE BREEZE)  LIQD Take 1 Container by mouth 3 (three) times daily between meals. 05/06/16   Thurnell Lose, MD  ferrous sulfate 325 (65 FE) MG tablet Take 1 tablet (325 mg total) by mouth 2 (two) times daily with a meal. 05/06/16   Thurnell Lose, MD  furosemide (LASIX) 40 MG tablet Take 1 tablet (40 mg total) by mouth 2 (two) times daily. 05/06/16   Thurnell Lose, MD  metoprolol tartrate (LOPRESSOR) 25 MG tablet Take 1 tablet (25 mg total) by mouth 2 (two) times daily. 05/06/16   Thurnell Lose, MD  oxyCODONE (OXY IR/ROXICODONE) 5 MG immediate release tablet Take 1 tablet (5 mg total) by mouth every 6 (six) hours as needed for moderate pain. 05/06/16   Thurnell Lose, MD  pantoprazole (PROTONIX) 40 MG tablet Take 1 tablet (40 mg total) by mouth daily. 05/06/16   Thurnell Lose, MD  polyethylene glycol (MIRALAX / GLYCOLAX) packet Take 17 g by mouth daily. 05/06/16   Thurnell Lose, MD  potassium chloride SA (K-DUR,KLOR-CON) 20 MEQ tablet Take 1 tablet (20 mEq total) by mouth daily. 05/06/16   Thurnell Lose, MD  spironolactone (ALDACTONE) 25 MG tablet Take 1 tablet (25 mg total) by mouth daily. 05/06/16   Thurnell Lose, MD  vitamin B-12 1000 MCG tablet Take 1 tablet (1,000 mcg total) by mouth daily. 05/06/16   Thurnell Lose, MD  Family History Family History  Problem Relation Age of Onset  . Diabetes Mellitus II Mother   . Hypertension Mother   . Diabetes Mellitus II Father   . Hypertension Father   . Breast cancer Maternal Aunt   . Breast cancer Paternal Aunt     Social History Social History  Substance Use Topics  . Smoking status: Never Smoker  . Smokeless tobacco: Never Used  . Alcohol use No     Allergies   Pseudoephedrine   Review of Systems Review of Systems  All other systems reviewed and are negative.    Physical Exam Updated Vital Signs BP 127/73   Pulse 96   Temp 98.6 F (37 C) (Oral)   Resp (!) 24   Ht 5\' 5"  (1.651 m)   Wt 210 lb (95.3 kg)   SpO2 98%    BMI 34.95 kg/m    Physical Exam  Constitutional: She is oriented to person, place, and time. She appears well-developed and well-nourished.  HENT:  Head: Normocephalic and atraumatic.  Eyes: Conjunctivae and EOM are normal. Pupils are equal, round, and reactive to light.  Neck: Normal range of motion and phonation normal. Neck supple.  Cardiovascular: Normal rate and regular rhythm.   PICC line, right upper arm, site and appliance appear normal.  Pulmonary/Chest: Effort normal and breath sounds normal. She exhibits no tenderness.  Abdominal: Soft. She exhibits distension (Moderate). There is no tenderness. There is no guarding.  Musculoskeletal: Normal range of motion.  Neurological: She is alert and oriented to person, place, and time. She exhibits normal muscle tone.  Skin: Skin is warm and dry. There is pallor.  Flat ecchymosis left upper arm.  She states this was a site of recent IV infiltration, during blood transfusion.  Psychiatric: She has a normal mood and affect. Her behavior is normal. Judgment and thought content normal.  Nursing note and vitals reviewed.    ED Treatments / Results  Labs (all labs ordered are listed, but only abnormal results are displayed) Labs Reviewed  COMPREHENSIVE METABOLIC PANEL - Abnormal; Notable for the following:       Result Value   Sodium 133 (*)    Chloride 97 (*)    Glucose, Bld 122 (*)    Calcium 8.0 (*)    Total Protein 6.3 (*)    Albumin 1.8 (*)    ALT 9 (*)    All other components within normal limits  CBC - Abnormal; Notable for the following:    WBC 15.4 (*)    RBC 3.41 (*)    Hemoglobin 7.0 (*)    HCT 24.8 (*)    MCV 72.7 (*)    MCH 20.5 (*)    MCHC 28.2 (*)    RDW 27.2 (*)    All other components within normal limits  POC OCCULT BLOOD, ED  TYPE AND SCREEN  PREPARE RBC (CROSSMATCH)    EKG  EKG Interpretation None       Radiology No results found.  Procedures Procedures (including critical care  time)  Medications Ordered in ED Medications  0.9 %  sodium chloride infusion (not administered)  ceFAZolin (ANCEF) IVPB 2g/100 mL premix (not administered)     Initial Impression / Assessment and Plan / ED Course  I have reviewed the triage vital signs and the nursing notes.  Pertinent labs & imaging results that were available during my care of the patient were reviewed by me and considered in my medical decision making (see chart  for details).     Medications  0.9 %  sodium chloride infusion (not administered)  ceFAZolin (ANCEF) IVPB 2g/100 mL premix (not administered)    Patient Vitals for the past 24 hrs:  BP Temp Temp src Pulse Resp SpO2 Height Weight  05/14/16 2300 127/73 - - 96 (!) 24 98 % - -  05/14/16 2200 (!) 111/57 - - 100 (!) 26 96 % - -  05/14/16 2130 (!) 110/53 - - (!) 101 (!) 24 95 % - -  05/14/16 2100 (!) 120/59 - - (!) 103 (!) 23 95 % - -  05/14/16 2041 (!) 131/57 98.6 F (37 C) Oral (!) 109 20 96 % - -  05/14/16 2035 - - - - - - 5\' 5"  (1.651 m) 210 lb (95.3 kg)    Blood transfusion, 2 units, and a dose of Ancef ordered which she missed earlier this evening.  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  Anemia symptomatic.  She requires repeat blood transfusion.  She is hemodynamically stable.  She is nontoxic.  Doubt recurrence of acute serious bacterial infection, or metabolic instability.  Disposition per Dr. Venora Maples, following blood and antibiotic infusion.   New Prescriptions New Prescriptions   No medications on file    Daleen Bo, MD 05/15/16 [phone]

## 2016-05-15 NOTE — ED Provider Notes (Signed)
Blood transfusion per Dr. Eulis Foster.  Patient tolerated the procedure well.  Vital signs stable.  No complications with blood transfusion.  Primary care follow-up   Jola Schmidt, MD 05/15/16 [phone]

## 2016-05-15 NOTE — ED Notes (Signed)
Patient left at this time with all belongings. 

## 2016-05-16 LAB — BPAM RBC
BLOOD PRODUCT EXPIRATION DATE: 201805162359
BLOOD PRODUCT EXPIRATION DATE: 201805282359
ISSUE DATE / TIME: 201805110200
ISSUE DATE / TIME: 201805110354
UNIT TYPE AND RH: 9500
Unit Type and Rh: 9500

## 2016-05-16 LAB — TYPE AND SCREEN
ABO/RH(D): O NEG
Antibody Screen: NEGATIVE
Unit division: 0
Unit division: 0

## 2016-05-19 ENCOUNTER — Other Ambulatory Visit (HOSPITAL_BASED_OUTPATIENT_CLINIC_OR_DEPARTMENT_OTHER): Payer: Medicaid Other

## 2016-05-19 ENCOUNTER — Ambulatory Visit (HOSPITAL_BASED_OUTPATIENT_CLINIC_OR_DEPARTMENT_OTHER): Payer: Medicaid Other | Admitting: Hematology and Oncology

## 2016-05-19 ENCOUNTER — Other Ambulatory Visit: Payer: Self-pay

## 2016-05-19 ENCOUNTER — Encounter: Payer: Self-pay | Admitting: *Deleted

## 2016-05-19 ENCOUNTER — Ambulatory Visit: Payer: Self-pay

## 2016-05-19 ENCOUNTER — Encounter: Payer: Self-pay | Admitting: Hematology and Oncology

## 2016-05-19 ENCOUNTER — Ambulatory Visit (HOSPITAL_BASED_OUTPATIENT_CLINIC_OR_DEPARTMENT_OTHER): Payer: Medicaid Other

## 2016-05-19 VITALS — BP 130/70 | HR 72 | Temp 98.4°F | Resp 18 | Ht 65.0 in | Wt 186.7 lb

## 2016-05-19 DIAGNOSIS — E538 Deficiency of other specified B group vitamins: Secondary | ICD-10-CM

## 2016-05-19 DIAGNOSIS — D509 Iron deficiency anemia, unspecified: Secondary | ICD-10-CM

## 2016-05-19 DIAGNOSIS — C569 Malignant neoplasm of unspecified ovary: Secondary | ICD-10-CM

## 2016-05-19 DIAGNOSIS — D519 Vitamin B12 deficiency anemia, unspecified: Secondary | ICD-10-CM

## 2016-05-19 DIAGNOSIS — E43 Unspecified severe protein-calorie malnutrition: Secondary | ICD-10-CM | POA: Diagnosis not present

## 2016-05-19 DIAGNOSIS — C786 Secondary malignant neoplasm of retroperitoneum and peritoneum: Secondary | ICD-10-CM

## 2016-05-19 DIAGNOSIS — R180 Malignant ascites: Secondary | ICD-10-CM

## 2016-05-19 DIAGNOSIS — Z452 Encounter for adjustment and management of vascular access device: Secondary | ICD-10-CM | POA: Diagnosis present

## 2016-05-19 DIAGNOSIS — D500 Iron deficiency anemia secondary to blood loss (chronic): Secondary | ICD-10-CM

## 2016-05-19 LAB — IRON AND TIBC
%SAT: 15 % — AB (ref 21–57)
IRON: 20 ug/dL — AB (ref 41–142)
TIBC: 130 ug/dL — AB (ref 236–444)
UIBC: 110 ug/dL — ABNORMAL LOW (ref 120–384)

## 2016-05-19 LAB — CBC WITH DIFFERENTIAL/PLATELET
BASO%: 0.1 % (ref 0.0–2.0)
BASOS ABS: 0 10*3/uL (ref 0.0–0.1)
EOS ABS: 0.2 10*3/uL (ref 0.0–0.5)
EOS%: 1.2 % (ref 0.0–7.0)
HCT: 29.1 % — ABNORMAL LOW (ref 34.8–46.6)
HEMOGLOBIN: 8.5 g/dL — AB (ref 11.6–15.9)
LYMPH#: 1.8 10*3/uL (ref 0.9–3.3)
LYMPH%: 14.4 % (ref 14.0–49.7)
MCH: 22.6 pg — AB (ref 25.1–34.0)
MCHC: 29.2 g/dL — AB (ref 31.5–36.0)
MCV: 77.4 fL — AB (ref 79.5–101.0)
MONO#: 0.6 10*3/uL (ref 0.1–0.9)
MONO%: 4.5 % (ref 0.0–14.0)
NEUT#: 9.9 10*3/uL — ABNORMAL HIGH (ref 1.5–6.5)
NEUT%: 79.8 % — AB (ref 38.4–76.8)
PLATELETS: 357 10*3/uL (ref 145–400)
RBC: 3.76 10*6/uL (ref 3.70–5.45)
RDW: 26.7 % — ABNORMAL HIGH (ref 11.2–14.5)
WBC: 12.4 10*3/uL — ABNORMAL HIGH (ref 3.9–10.3)
nRBC: 0 % (ref 0–0)

## 2016-05-19 LAB — COMPREHENSIVE METABOLIC PANEL
ALBUMIN: 1.6 g/dL — AB (ref 3.5–5.0)
ALK PHOS: 98 U/L (ref 40–150)
ALT: <6 U/L (ref 0–55)
ANION GAP: 10 meq/L (ref 3–11)
AST: 18 U/L (ref 5–34)
BUN: 18 mg/dL (ref 7.0–26.0)
CHLORIDE: 98 meq/L (ref 98–109)
CO2: 29 mEq/L (ref 22–29)
Calcium: 8.2 mg/dL — ABNORMAL LOW (ref 8.4–10.4)
Creatinine: 0.6 mg/dL (ref 0.6–1.1)
EGFR: >90 mL/min/{1.73_m2} (ref >90)
Glucose: 89 mg/dl (ref 70–140)
POTASSIUM: 3.6 meq/L (ref 3.5–5.1)
Sodium: 137 mEq/L (ref 136–145)
Total Bilirubin: 0.87 mg/dL (ref 0.20–1.20)
Total Protein: 6.6 g/dL (ref 6.4–8.3)

## 2016-05-19 MED ORDER — HEPARIN SOD (PORK) LOCK FLUSH 100 UNIT/ML IV SOLN
500.0000 [IU] | Freq: Once | INTRAVENOUS | Status: AC
  Administered 2016-05-19: 250 [IU]
  Filled 2016-05-19: qty 5

## 2016-05-19 MED ORDER — PROCHLORPERAZINE MALEATE 10 MG PO TABS: 10.0000 mg | ORAL_TABLET | Freq: Four times a day (QID) | ORAL | 1 refills | Status: DC | PRN

## 2016-05-19 MED ORDER — DEXAMETHASONE 4 MG PO TABS: ORAL_TABLET | ORAL | 1 refills | Status: DC

## 2016-05-19 MED ORDER — CYANOCOBALAMIN 1000 MCG/ML IJ SOLN: 1000.0000 ug | Freq: Once | INTRAMUSCULAR | Status: DC

## 2016-05-19 MED ORDER — SODIUM CHLORIDE 0.9% FLUSH
10.0000 mL | Freq: Once | INTRAVENOUS | Status: AC
  Administered 2016-05-19: 10 mL
  Filled 2016-05-19: qty 10

## 2016-05-19 MED ORDER — LIDOCAINE-PRILOCAINE 2.5-2.5 % EX CREA: TOPICAL_CREAM | CUTANEOUS | 3 refills | Status: DC

## 2016-05-19 MED ORDER — ONDANSETRON HCL 8 MG PO TABS: 8.0000 mg | ORAL_TABLET | Freq: Two times a day (BID) | ORAL | 1 refills | Status: DC | PRN

## 2016-05-19 NOTE — Assessment & Plan Note (Signed)
She was recently found to have severe vitamin B12 deficiency. She will continue B12 injection with each treatment

## 2016-05-19 NOTE — Assessment & Plan Note (Addendum)
I have discussed this with the GYN oncologist. Given her significant comorbidities and recent SBP, primary surgical resection is not recommended. We will proceed with neoadjuvant chemotherapy for 3 cycles with carboplatin and Taxol. The risks, benefits, side effects of treatment including risk of pancytopenia, infection, peripheral neuropathy, allergic reaction and others were discussed and she agreed to proceed I will give her premedication with dexamethasone before we start treatment We will use a PICC line for blood draw along with chemotherapy. I will see her back every 3 weeks before each dose of treatment. Due to recent infection/sepsis, we will cover her with G-CSF support with each visit.

## 2016-05-19 NOTE — Patient Instructions (Signed)
Cyanocobalamin, Vitamin B12 injection What is this medicine? CYANOCOBALAMIN (sye an oh koe BAL a min) is a man made form of vitamin B12. Vitamin B12 is used in the growth of healthy blood cells, nerve cells, and proteins in the body. It also helps with the metabolism of fats and carbohydrates. This medicine is used to treat people who can not absorb vitamin B12. This medicine may be used for other purposes; ask your health care provider or pharmacist if you have questions. COMMON BRAND NAME(S): B-12 Compliance Kit, B-12 Injection Kit, Cyomin, LA-12, Nutri-Twelve, Physicians EZ Use B-12, Primabalt What should I tell my health care provider before I take this medicine? They need to know if you have any of these conditions: -kidney disease -Leber's disease -megaloblastic anemia -an unusual or allergic reaction to cyanocobalamin, cobalt, other medicines, foods, dyes, or preservatives -pregnant or trying to get pregnant -breast-feeding How should I use this medicine? This medicine is injected into a muscle or deeply under the skin. It is usually given by a health care professional in a clinic or doctor's office. However, your doctor may teach you how to inject yourself. Follow all instructions. Talk to your pediatrician regarding the use of this medicine in children. Special care may be needed. Overdosage: If you think you have taken too much of this medicine contact a poison control center or emergency room at once. NOTE: This medicine is only for you. Do not share this medicine with others. What if I miss a dose? If you are given your dose at a clinic or doctor's office, call to reschedule your appointment. If you give your own injections and you miss a dose, take it as soon as you can. If it is almost time for your next dose, take only that dose. Do not take double or extra doses. What may interact with this medicine? -colchicine -heavy alcohol intake This list may not describe all possible  interactions. Give your health care provider a list of all the medicines, herbs, non-prescription drugs, or dietary supplements you use. Also tell them if you smoke, drink alcohol, or use illegal drugs. Some items may interact with your medicine. What should I watch for while using this medicine? Visit your doctor or health care professional regularly. You may need blood work done while you are taking this medicine. You may need to follow a special diet. Talk to your doctor. Limit your alcohol intake and avoid smoking to get the best benefit. What side effects may I notice from receiving this medicine? Side effects that you should report to your doctor or health care professional as soon as possible: -allergic reactions like skin rash, itching or hives, swelling of the face, lips, or tongue -blue tint to skin -chest tightness, pain -difficulty breathing, wheezing -dizziness -red, swollen painful area on the leg Side effects that usually do not require medical attention (report to your doctor or health care professional if they continue or are bothersome): -diarrhea -headache This list may not describe all possible side effects. Call your doctor for medical advice about side effects. You may report side effects to FDA at 1-800-FDA-1088. Where should I keep my medicine? Keep out of the reach of children. Store at room temperature between 15 and 30 degrees C (59 and 85 degrees F). Protect from light. Throw away any unused medicine after the expiration date. NOTE: This sheet is a summary. It may not cover all possible information. If you have questions about this medicine, talk to your doctor, pharmacist, or   health care provider.  2018 Elsevier/Gold Standard (2007-04-04 22:10:20)  

## 2016-05-19 NOTE — Assessment & Plan Note (Signed)
She has severe protein calorie malnutrition due to untreated cancer She is eating well Hopefully, with treatment, her nutritional status will improve

## 2016-05-19 NOTE — Progress Notes (Signed)
[facility] OFFICE PROGRESS NOTE  Patient Care Team: Patient, No Pcp Per as PCP - General (General Practice)  SUMMARY OF ONCOLOGIC HISTORY:   Ovarian cancer ([place])   04/25/2016 Imaging    CT abdomen:  1. 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 Consultation with Gynecology is recommended. 2. Small left pleural effusion. 3. Aortic atherosclerosis, in addition to 2 vessel coronary artery disease.  4. Cholelithiasis.      04/25/2016 - 05/06/2016 Hospital Admission    The patient was admitted to the hospital for evaluation of severe anemia, abdominal bloating.  She received blood transfusion.  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       04/26/2016 Pathology Results    PERITONEAL /ASCITIC FLUID (SPECIMEN 1 OF 1, COLLECTED ON 04/26/16): ACUTE INFLAMMATION. NO MALIGNANT CELLS IDENTIFIED.      04/26/2016 Tumor Marker    Patient's tumor was tested for the following markers: CA125 Results of the tumor marker test revealed 227.7      04/27/2016 Imaging    1. No CT evidence of pulmonary artery embolus. 2. Areas of consolidative change involving the left lower lobe as well as scattered pulmonary densities in the left upper lobe. Findings may represent pneumonia. Correlation with clinical exam recommended. 3. Overall decreased left lung volume with leftward shift of the mediastinum and heart. 4. Segmental air trapping in the right middle lobe with diffuse haziness of the right upper and right lower lobes. 5. Ascites.       04/27/2016 Imaging    ECHO - Left ventricle: The cavity size was normal. There  was moderate concentric hypertrophy. Systolic function was normal. The estimated ejection fraction was in the range of 60% to 65%. Wall motion was normal; there were no regional wall motion abnormalities. Features are consistent with a pseudonormal left ventricular filling pattern, with concomitant abnormal relaxation and increased filling pressure (grade 2 diastolic dysfunction). - Tricuspid valve: There was mild-moderate regurgitation directed centrally. - Pulmonary arteries: Systolic pressure was moderately increased. PA peak pressure: 58 mm Hg (S).      04/28/2016 Pathology Results    PERITONEAL /ASCITIC FLUID (SPECIMEN 1 OF 1, COLLECTED ON 04/28/2016): NO MALIGNANT CELLS IDENTIFIED. ACUTE INFLAMMATION.      04/28/2016 Procedure    Successful ultrasound-guided diagnostic and therapeutic paracentesis yielding 530 liters of peritoneal fluid.      05/01/2016 Pathology Results    Soft Tissue Needle Core Biopsy, Omental - POORLY DIFFERENTIATED HIGH GRADE MALIGNANCY, SEE COMMENT. Microscopic Comment Core biopsies reveal nests of malignant cells. Immunohistochemistry is positive for WT-1, ER, p53, PAX-8 (Very weak). Pancytokeratin, cytokeratin 7, cytokeratin 20, cytokeratin 5/6, cytokeratin 903, cytokeratin 8/18, EMA, calretinin, and MOC31 are negative. The immunohistochemical profile, while not entirely specific, is suggestive of a gynecologic primary, especially given the radiographic findings.       05/01/2016 Procedure    Successful CT-guided anterior omental mass 18 gauge core biopsy      05/05/2016 Procedure    Peripherally Inserted Central Catheter/Midline Placement       INTERVAL HISTORY: Please see below for problem oriented charting. She is seen prior to chemotherapy. She has attended chemo teaching class She has recovered from recent SBP and completed IV antibiotics She is too weak  to return home She eats better. She denies recent changes in bowel habits. The patient denies  any recent signs or symptoms of bleeding such as spontaneous epistaxis, hematuria or hematochezia.  REVIEW OF SYSTEMS:   Constitutional: Denies fevers, chills or abnormal weight loss Eyes: Denies blurriness of vision Ears, nose, mouth, throat, and face: Denies mucositis or sore throat Respiratory: Denies cough, dyspnea or wheezes Cardiovascular: Denies palpitation, chest discomfort or lower extremity swelling Gastrointestinal:  Denies nausea, heartburn or change in bowel habits Skin: Denies abnormal skin rashes Lymphatics: Denies new lymphadenopathy or easy bruising Neurological:Denies numbness, tingling or new weaknesses Behavioral/Psych: Mood is stable, no new changes  All other systems were reviewed with the patient and are negative.  I have reviewed the past medical history, past surgical history, social history and family history with the patient and they are unchanged from previous note.  ALLERGIES:  is allergic to pseudoephedrine.  MEDICATIONS:  Current Outpatient Prescriptions  Medication Sig Dispense Refill  . aspirin EC 81 MG tablet Take 1 tablet (81 mg total) by mouth 2 (two) times daily. 30 tablet 0  . ceFAZolin (ANCEF) 2-4 GM/100ML-% IVPB Inject 100 mLs (2 g total) into the vein every 8 (eight) hours. Stop date 05-20-16 1 each 0  . feeding supplement (BOOST / RESOURCE BREEZE) LIQD Take 1 Container by mouth 3 (three) times daily between meals.  0  . ferrous sulfate 325 (65 FE) MG tablet Take 1 tablet (325 mg total) by mouth 2 (two) times daily with a meal.  3  . furosemide (LASIX) 40 MG tablet Take 1 tablet (40 mg total) by mouth 2 (two) times daily. 30 tablet   . metoprolol tartrate (LOPRESSOR) 25 MG tablet Take 1 tablet (25 mg total) by mouth 2 (two) times daily.    Marland Kitchen oxyCODONE (OXY IR/ROXICODONE) 5 MG immediate release tablet Take 1 tablet (5 mg total) by mouth every 6 (six) hours as needed for moderate pain. 15 tablet 0  . pantoprazole (PROTONIX) 40 MG tablet Take 1 tablet  (40 mg total) by mouth daily.    . polyethylene glycol (MIRALAX / GLYCOLAX) packet Take 17 g by mouth daily. 14 each 0  . potassium chloride SA (K-DUR,KLOR-CON) 20 MEQ tablet Take 1 tablet (20 mEq total) by mouth daily.    . vitamin B-12 1000 MCG tablet Take 1 tablet (1,000 mcg total) by mouth daily.    Marland Kitchen dexamethasone (DECADRON) 4 MG tablet Take 5 tablets at 6 am by mouth on the days of chemo only,e very 3 weeks, starting 05/20/16 30 tablet 1  . lidocaine-prilocaine (EMLA) cream Apply to affected area once 30 g 3  . ondansetron (ZOFRAN) 8 MG tablet Take 1 tablet (8 mg total) by mouth 2 (two) times daily as needed for refractory nausea / vomiting. Start on day 3 after chemo. 30 tablet 1  . prochlorperazine (COMPAZINE) 10 MG tablet Take 1 tablet (10 mg total) by mouth every 6 (six) hours as needed (Nausea or vomiting). 30 tablet 1  . spironolactone (ALDACTONE) 25 MG tablet Take 1 tablet (25 mg total) by mouth daily. (Patient not taking: Reported on 05/19/2016)     No current facility-administered medications for this visit.     PHYSICAL EXAMINATION: ECOG PERFORMANCE STATUS: 2 - Symptomatic, <50% confined to bed  Vitals:   05/19/16 1249  BP: 130/70  Pulse: 72  Resp: 18  Temp: 98.4 F (36.9 C)   Filed Weights   05/19/16 1249  Weight: 186 lb 11.2 oz (84.7  kg)    GENERAL:alert, no distress and comfortable SKIN: Noted significant bruises EYES: normal, Conjunctiva are pale k and non-injected, sclera clear OROPHARYNX:no exudate, no erythema and lips, buccal mucosa, and tongue normal  NECK: supple, thyroid normal size, non-tender, without nodularity LYMPH:  no palpable lymphadenopathy in the cervical, axillary or inguinal LUNGS: clear to auscultation and percussion with normal breathing effort HEART: regular rate & rhythm and no murmurs with moderate bilateral lower extremity edema ABDOMEN:abdomen soft, abdomen is distended with ascites, normal bowel sounds Musculoskeletal:no cyanosis of  digits and no clubbing  NEURO: alert & oriented x 3 with fluent speech, no focal motor/sensory deficits  LABORATORY DATA:  I have reviewed the data as listed    Component Value Date/Time   NA 137 05/19/2016 1059   K 3.6 05/19/2016 1059   CL 97 (L) 05/14/2016 2135   CO2 29 05/19/2016 1059   GLUCOSE 89 05/19/2016 1059   BUN 18.0 05/19/2016 1059   CREATININE 0.6 05/19/2016 1059   CALCIUM 8.2 (L) 05/19/2016 1059   PROT 6.6 05/19/2016 1059   ALBUMIN 1.6 (L) 05/19/2016 1059   AST 18 05/19/2016 1059   ALT <6 05/19/2016 1059   ALKPHOS 98 05/19/2016 1059   BILITOT 0.87 05/19/2016 1059   GFRNONAA >60 05/14/2016 2135   GFRAA >60 05/14/2016 2135    No results found for: SPEP, UPEP  Lab Results  Component Value Date   WBC 12.4 (H) 05/19/2016   NEUTROABS 9.9 (H) 05/19/2016   HGB 8.5 (L) 05/19/2016   HCT 29.1 (L) 05/19/2016   MCV 77.4 (L) 05/19/2016   PLT 357 05/19/2016      Chemistry      Component Value Date/Time   NA 137 05/19/2016 1059   K 3.6 05/19/2016 1059   CL 97 (L) 05/14/2016 2135   CO2 29 05/19/2016 1059   BUN 18.0 05/19/2016 1059   CREATININE 0.6 05/19/2016 1059      Component Value Date/Time   CALCIUM 8.2 (L) 05/19/2016 1059   ALKPHOS 98 05/19/2016 1059   AST 18 05/19/2016 1059   ALT <6 05/19/2016 1059   BILITOT 0.87 05/19/2016 1059       RADIOGRAPHIC STUDIES: I have personally reviewed the radiological images as listed and agreed with the findings in the report. Ct Angio Chest Pe W Or Wo Contrast  Result Date: 04/27/2016 CLINICAL DATA:  61 year old female with history of ovarian cancer presenting with tachycardia. EXAM: CT ANGIOGRAPHY CHEST WITH CONTRAST TECHNIQUE: Multidetector CT imaging of the chest was performed using the standard protocol during bolus administration of intravenous contrast. Multiplanar CT image reconstructions and MIPs were obtained to evaluate the vascular anatomy. CONTRAST:  100 cc Isovue 370 COMPARISON:  Chest radiograph dated  04/25/2016 and CT of the abdomen pelvis dated 04/25/2016 FINDINGS: Cardiovascular: There is mild cardiomegaly. No pericardial effusion. There is coronary vascular calcification involving the LAD. The thoracic aorta appears unremarkable. The origins of the great vessels of the aortic arch appear patent. There is dilatation of the main pulmonary trunk suggestive of underlying pulmonary hypertension. Evaluation of the pulmonary arteries is limited due to suboptimal opacification of the peripheral branches. No central pulmonary artery embolus identified. Mediastinum/Nodes: There is no hilar or mediastinal adenopathy. The esophagus and the thyroid gland are grossly unremarkable. Lungs/Pleura: There is a small left pleural effusion. There are consolidative changes of the left lower lobe as well as scattered densities in the left upper lobe. Overall there is volume loss in the left lung with shift of  the heart into the left hemithorax. There is segmental air trapping in the right middle lobe with diffuse hazy airspace density in the right upper and right lower lobes. Paramediastinal subpleural density in the right upper lobe likely represents atelectatic changes or pleural thickening. There is no pneumothorax. Upper Abdomen: Ascites is partially visualized. Musculoskeletal: Multilevel degenerative changes of the spine. No acute osseous pathology. Review of the MIP images confirms the above findings. IMPRESSION: 1. No CT evidence of pulmonary artery embolus. 2. Areas of consolidative change involving the left lower lobe as well as scattered pulmonary densities in the left upper lobe. Findings may represent pneumonia. Correlation with clinical exam recommended. 3. Overall decreased left lung volume with leftward shift of the mediastinum and heart. 4. Segmental air trapping in the right middle lobe with diffuse haziness of the right upper and right lower lobes. 5. Ascites. Electronically Signed   By: Anner Crete M.D.    On: 04/27/2016 05:40   Ct Abdomen Pelvis W Contrast  Result Date: 04/25/2016 CLINICAL DATA:  61 year old female with history of abdominal distension and nausea. No recent vomiting. EXAM: CT ABDOMEN AND PELVIS WITH CONTRAST TECHNIQUE: Multidetector CT imaging of the abdomen and pelvis was performed using the standard protocol following bolus administration of intravenous contrast. CONTRAST:  1 ISOVUE-300 IOPAMIDOL (ISOVUE-300) INJECTION 61% COMPARISON:  No priors. FINDINGS: Lower chest: Air trapping right middle lobe. Small left pleural effusion. Atherosclerotic calcifications in the left anterior descending and left circumflex coronary arteries. Hepatobiliary: No suspicious cystic or solid hepatic lesions. No intra or extrahepatic biliary ductal dilatation. Small calcified gallstones lying dependently in the gallbladder. No findings to suggest an acute cholecystitis at this time. Pancreas: No pancreatic mass. No pancreatic ductal dilatation. No pancreatic or peripancreatic fluid or inflammatory changes. Spleen: Unremarkable. Adrenals/Urinary Tract: Right kidney and bilateral adrenal glands are normal in appearance. 1.9 cm simple cyst in the interpolar region of the left kidney. No hydroureteronephrosis. Urinary bladder is normal in appearance. Stomach/Bowel: The appearance of the stomach is normal. There is no pathologic dilatation of small bowel or colon. Appendix is not confidently visualized. Vascular/Lymphatic: No significant atherosclerotic disease, aneurysm or dissection identified in the abdominal or pelvic vasculature. No lymphadenopathy noted in the abdomen or pelvis. Reproductive: Large complex mixed cystic and solid mass which emanates from one or both of the adnexal regions, highly concerning for ovarian neoplasm. This measures approximately 20.5 x 23.1 x 24.6 cm (coronal image 46 of series 6 and sagittal image 65 of series 7), and has mixed cystic and solid components, with some heterogeneous  internal calcification. Uterus is also heterogeneous in appearance, with multiple densely calcified lesions, presumably multiple fibroids, largest of which measures 5.5 cm in the anterior aspect of the fundal region. Other: Large volume of ascites. Extensive nodular soft tissue thickening throughout the omentum, compatible with omental caking. Some of this extends through the umbilical region. No pneumoperitoneum. Musculoskeletal: There are no aggressive appearing lytic or blastic lesions noted in the visualized portions of the skeleton. IMPRESSION: 1. 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 Consultation with Gynecology is recommended. 2. Small left pleural effusion. 3. Aortic atherosclerosis, in addition to 2 vessel coronary artery disease. Please note that although the presence of coronary artery calcium documents the presence of coronary artery disease, the severity of this disease and any potential stenosis cannot be assessed on this non-gated CT examination. Assessment for potential risk factor modification, dietary therapy or pharmacologic therapy may be  warranted, if clinically indicated. 4. Cholelithiasis. Electronically Signed   By: Vinnie Langton M.D.   On: 04/25/2016 18:59   US Paracentesis  Result Date: 04/26/2016 INDICATION: Patient with abdominal pain and large complex solid/ cystic adnexal mass, ascites, omental thickening concerning for ovarian cancer; request received for diagnostic and therapeutic paracentesis. EXAM: ULTRASOUND GUIDED DIAGNOSTIC AND THERAPEUTIC PARACENTESIS MEDICATIONS: None. COMPLICATIONS: None immediate. PROCEDURE: Informed written consent was obtained from the patient after a discussion of the risks, benefits and alternatives to treatment. A timeout was performed prior to the initiation of the procedure. Initial ultrasound scanning demonstrates a moderate amount of ascites within  the left mid to lower abdominal quadrant. The left mid to lower abdomen was prepped and draped in the usual sterile fashion. 1% lidocaine was used for local anesthesia. Following this, a Yueh catheter was introduced. An ultrasound image was saved for documentation purposes. The paracentesis was performed. The catheter was removed and a dressing was applied. The patient tolerated the procedure well without immediate post procedural complication. FINDINGS: A total of approximately 2.5 liters of turbid, bloody fluid was removed. Samples were sent to the laboratory as requested by the clinical team. IMPRESSION: Successful ultrasound-guided diagnostic and therapeutic paracentesis yielding 2.5 liters of peritoneal fluid. Read by: Rowe Robert, PA-C Electronically Signed   By: Corrie Mckusick D.O.   On: 04/26/2016 13:58   Ct Biopsy  Result Date: 05/01/2016 INDICATION: LARGE ABDOMINOPELVIC COMPLEX CYSTIC MASS INVOLVING THE OMENTUM EXAM: CT-GUIDED BIOPSY ANTERIOR OMENTAL MASS MEDICATIONS: 1% LIDOCAINE LOCALLY ANESTHESIA/SEDATION: 1.0 mg IV Versed; 25 mcg IV Fentanyl Moderate Sedation Time:  13 minutes The patient was continuously monitored during the procedure by the interventional radiology nurse under my direct supervision. PROCEDURE: The procedure, risks, benefits, and alternatives were explained to the patient. Questions regarding the procedure were encouraged and answered. The patient understands and consents to the procedure. Previous imaging reviewed. Patient positioned supine. Noncontrast localization CT performed. The large abdominopelvic mass involving the omentum was localized. Omental component was marked in the anterior abdomen just above the umbilicus. Under sterile conditions and local anesthesia, a 17 gauge coaxial guide needle was advanced into the anterior omental mass. Needle position confirmed with CT. 18 gauge core biopsies obtained. These were placed in formalin. Patient tolerated the procedure well  without complication. Vital sign monitoring by nursing staff during the procedure will continue as patient is in the special procedures unit for post procedure observation. FINDINGS: The images document guide needle placement within the anterior omental mass. Post biopsy images demonstrate no hemorrhage or hematoma. COMPLICATIONS: None immediate. IMPRESSION: Successful CT-guided anterior omental mass 18 gauge core biopsy Electronically Signed   By: Jerilynn Mages.  Shick M.D.   On: 05/01/2016 15:17   Dg Chest Port 1 View  Result Date: 04/25/2016 CLINICAL DATA:  Abdominal distension with fever EXAM: PORTABLE CHEST 1 VIEW COMPARISON:  None. FINDINGS: Low lung volumes. Non inclusion of the left CP angle. Borderline to mild cardiomegaly, exaggerated by low lung volume. Mild atherosclerosis. No focal consolidation. No pneumothorax. IMPRESSION: Low lung volumes, without infiltrate or edema. Borderline to mild cardiomegaly. Electronically Signed   By: Donavan Foil M.D.   On: 04/25/2016 17:54   Ir Paracentesis  Result Date: 04/28/2016 INDICATION: Patient with ascites. Request is made for diagnostic and therapeutic paracentesis. EXAM: ULTRASOUND GUIDED DIAGNOSTIC AND THERAPEUTIC PARACENTESIS MEDICATIONS: 10 mL 1% lidocaine COMPLICATIONS: None immediate. PROCEDURE: Informed written consent was obtained from the patient after a discussion of the risks, benefits and alternatives to treatment. A timeout was  performed prior to the initiation of the procedure. Initial ultrasound scanning demonstrates a large amount of ascites within the right lateral abdomen. The right lateral abdomen was prepped and draped in the usual sterile fashion. 1% lidocaine was used for local anesthesia. Following this, a 19 gauge, 7-cm, Yueh catheter was introduced. An ultrasound image was saved for documentation purposes. The paracentesis was performed. The catheter was removed and a dressing was applied. The patient tolerated the procedure well without  immediate post procedural complication. FINDINGS: A total of approximately 530 mL of thick, amber fluid was removed. Samples were sent to the laboratory as requested by the clinical team. IMPRESSION: Successful ultrasound-guided diagnostic and therapeutic paracentesis yielding 530 liters of peritoneal fluid. Read by:  Brynda Greathouse PA-C Electronically Signed   By: Corrie Mckusick D.O.   On: 04/28/2016 13:59    ASSESSMENT & PLAN:  Ovarian cancer ([place]) I have discussed this with the GYN oncologist. Given her significant comorbidities and recent SBP, primary surgical resection is not recommended. We will proceed with neoadjuvant chemotherapy for 3 cycles with carboplatin and Taxol. The risks, benefits, side effects of treatment including risk of pancytopenia, infection, peripheral neuropathy, allergic reaction and others were discussed and she agreed to proceed I will give her premedication with dexamethasone before we start treatment We will use a PICC line for blood draw along with chemotherapy. I will see her back every 3 weeks before each dose of treatment. Due to recent infection/sepsis, we will cover her with G-CSF support with each visit.  B12 deficiency She was recently found to have severe vitamin B12 deficiency. 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  All  questions were answered. The patient knows to call the clinic with any problems, questions or concerns. No barriers to learning was detected. I spent 40 minutes counseling the patient face to face. The total time spent in the appointment was 55 minutes and more than 50% was on counseling and review of test results     Heath Lark, MD 05/19/2016 5:31 PM

## 2016-05-19 NOTE — Progress Notes (Signed)
Pt let center today without getting B12 injection. Plan to give on chemo treatment day per  Dr. Alvy Bimler

## 2016-05-19 NOTE — Assessment & Plan Note (Signed)
She has recent iron deficiency anemia likely due to chronic GI bleed She will receive intravenous iron along with oral iron replacement therapy She has received multiple units of blood transfusions recently

## 2016-05-19 NOTE — Assessment & Plan Note (Signed)
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

## 2016-05-20 ENCOUNTER — Encounter: Payer: Self-pay | Admitting: Hematology and Oncology

## 2016-05-20 ENCOUNTER — Other Ambulatory Visit: Payer: Self-pay | Admitting: *Deleted

## 2016-05-20 ENCOUNTER — Ambulatory Visit (HOSPITAL_BASED_OUTPATIENT_CLINIC_OR_DEPARTMENT_OTHER): Payer: Medicaid Other

## 2016-05-20 VITALS — BP 137/75 | HR 68 | Temp 98.1°F | Resp 18

## 2016-05-20 DIAGNOSIS — C569 Malignant neoplasm of unspecified ovary: Secondary | ICD-10-CM

## 2016-05-20 DIAGNOSIS — C786 Secondary malignant neoplasm of retroperitoneum and peritoneum: Secondary | ICD-10-CM

## 2016-05-20 DIAGNOSIS — Z5111 Encounter for antineoplastic chemotherapy: Secondary | ICD-10-CM | POA: Diagnosis not present

## 2016-05-20 LAB — VITAMIN B12: Vitamin B12: 1930 pg/mL — ABNORMAL HIGH (ref 232–1245)

## 2016-05-20 MED ORDER — SODIUM CHLORIDE 0.9 % IV SOLN
20.0000 mg | Freq: Once | INTRAVENOUS | Status: AC
  Administered 2016-05-20: 20 mg via INTRAVENOUS
  Filled 2016-05-20: qty 2

## 2016-05-20 MED ORDER — HEPARIN SOD (PORK) LOCK FLUSH 100 UNIT/ML IV SOLN
250.0000 [IU] | Freq: Once | INTRAVENOUS | Status: AC | PRN
  Administered 2016-05-20: 250 [IU]
  Filled 2016-05-20: qty 5

## 2016-05-20 MED ORDER — FAMOTIDINE IN NACL 20-0.9 MG/50ML-% IV SOLN
INTRAVENOUS | Status: AC
  Filled 2016-05-20: qty 50

## 2016-05-20 MED ORDER — SODIUM CHLORIDE 0.9 % IV SOLN
Freq: Once | INTRAVENOUS | Status: AC
  Administered 2016-05-20: 08:00:00 via INTRAVENOUS

## 2016-05-20 MED ORDER — SODIUM CHLORIDE 0.9 % IV SOLN
750.0000 mg | Freq: Once | INTRAVENOUS | Status: AC
  Administered 2016-05-20: 750 mg via INTRAVENOUS
  Filled 2016-05-20: qty 75

## 2016-05-20 MED ORDER — PROCHLORPERAZINE MALEATE 10 MG PO TABS: 10.0000 mg | ORAL_TABLET | Freq: Four times a day (QID) | ORAL | 1 refills | Status: AC | PRN

## 2016-05-20 MED ORDER — FAMOTIDINE IN NACL 20-0.9 MG/50ML-% IV SOLN
20.0000 mg | Freq: Once | INTRAVENOUS | Status: AC
  Administered 2016-05-20: 20 mg via INTRAVENOUS

## 2016-05-20 MED ORDER — DEXAMETHASONE 4 MG PO TABS: ORAL_TABLET | ORAL | 1 refills | Status: AC

## 2016-05-20 MED ORDER — PALONOSETRON HCL INJECTION 0.25 MG/5ML
0.2500 mg | Freq: Once | INTRAVENOUS | Status: AC
  Administered 2016-05-20: 0.25 mg via INTRAVENOUS

## 2016-05-20 MED ORDER — DIPHENHYDRAMINE HCL 50 MG/ML IJ SOLN
INTRAMUSCULAR | Status: AC
  Filled 2016-05-20: qty 1

## 2016-05-20 MED ORDER — PALONOSETRON HCL INJECTION 0.25 MG/5ML
INTRAVENOUS | Status: AC
  Filled 2016-05-20: qty 5

## 2016-05-20 MED ORDER — SODIUM CHLORIDE 0.9% FLUSH
10.0000 mL | INTRAVENOUS | Status: DC | PRN
  Administered 2016-05-20: 10 mL
  Filled 2016-05-20: qty 10

## 2016-05-20 MED ORDER — DIPHENHYDRAMINE HCL 50 MG/ML IJ SOLN
50.0000 mg | Freq: Once | INTRAMUSCULAR | Status: AC
  Administered 2016-05-20: 50 mg via INTRAVENOUS

## 2016-05-20 MED ORDER — ONDANSETRON HCL 8 MG PO TABS
8.0000 mg | ORAL_TABLET | Freq: Two times a day (BID) | ORAL | 1 refills | Status: AC | PRN
Start: 2016-05-20 — End: ?

## 2016-05-20 MED ORDER — PACLITAXEL CHEMO INJECTION 300 MG/50ML
175.0000 mg/m2 | Freq: Once | INTRAVENOUS | Status: AC
  Administered 2016-05-20: 360 mg via INTRAVENOUS
  Filled 2016-05-20: qty 60

## 2016-05-20 NOTE — Patient Instructions (Addendum)
[facility] Discharge Instructions for Patients Receiving Chemotherapy  Today you received the following chemotherapy agents taxol/carboplatin   To help prevent nausea and vomiting after your treatment, we encourage you to take your nausea medication as directed.   If you develop nausea and vomiting that is not controlled by your nausea medication, call the clinic.   BELOW ARE SYMPTOMS THAT SHOULD BE REPORTED IMMEDIATELY:  *FEVER GREATER THAN 100.5 F  *CHILLS WITH OR WITHOUT FEVER  NAUSEA AND VOMITING THAT IS NOT CONTROLLED WITH YOUR NAUSEA MEDICATION  *UNUSUAL SHORTNESS OF BREATH  *UNUSUAL BRUISING OR BLEEDING  TENDERNESS IN MOUTH AND THROAT WITH OR WITHOUT PRESENCE OF ULCERS  *URINARY PROBLEMS  *BOWEL PROBLEMS  UNUSUAL RASH Items with * indicate a potential emergency and should be followed up as soon as possible.  Feel free to call the clinic you have any questions or concerns. The clinic phone number is [phone].  Paclitaxel injection What is this medicine? PACLITAXEL (PAK li TAX el) is a chemotherapy drug. It targets fast dividing cells, like cancer cells, and causes these cells to die. This medicine is used to treat ovarian cancer, breast cancer, and other cancers. This medicine may be used for other purposes; ask your health care provider or pharmacist if you have questions. COMMON BRAND NAME(S): Onxol, Taxol What should I tell my health care provider before I take this medicine? They need to know if you have any of these conditions: -blood disorders -irregular heartbeat -infection (especially a virus infection such as chickenpox, cold sores, or herpes) -liver disease -previous or ongoing radiation therapy -an unusual or allergic reaction to paclitaxel, alcohol, polyoxyethylated castor oil, other chemotherapy agents, other medicines, foods, dyes, or preservatives -pregnant or trying to get pregnant -breast-feeding How should I use this  medicine? This drug is given as an infusion into a vein. It is administered in a hospital or clinic by a specially trained health care professional. Talk to your pediatrician regarding the use of this medicine in children. Special care may be needed. Overdosage: If you think you have taken too much of this medicine contact a poison control center or emergency room at once. NOTE: This medicine is only for you. Do not share this medicine with others. What if I miss a dose? It is important not to miss your dose. Call your doctor or health care professional if you are unable to keep an appointment. What may interact with this medicine? Do not take this medicine with any of the following medications: -disulfiram -metronidazole This medicine may also interact with the following medications: -cyclosporine -diazepam -ketoconazole -medicines to increase blood counts like filgrastim, pegfilgrastim, sargramostim -other chemotherapy drugs like cisplatin, doxorubicin, epirubicin, etoposide, teniposide, vincristine -quinidine -testosterone -vaccines -verapamil Talk to your doctor or health care professional before taking any of these medicines: -acetaminophen -aspirin -ibuprofen -ketoprofen -naproxen This list may not describe all possible interactions. Give your health care provider a list of all the medicines, herbs, non-prescription drugs, or dietary supplements you use. Also tell them if you smoke, drink alcohol, or use illegal drugs. Some items may interact with your medicine. What should I watch for while using this medicine? Your condition will be monitored carefully while you are receiving this medicine. You will need important blood work done while you are taking this medicine. This medicine can cause serious allergic reactions. To reduce your risk you will need to take other medicine(s) before treatment with this medicine. If you experience allergic reactions like skin rash, itching   or  hives, swelling of the face, lips, or tongue, tell your doctor or health care professional right away. In some cases, you may be given additional medicines to help with side effects. Follow all directions for their use. This drug may make you feel generally unwell. This is not uncommon, as chemotherapy can affect healthy cells as well as cancer cells. Report any side effects. Continue your course of treatment even though you feel ill unless your doctor tells you to stop. Call your doctor or health care professional for advice if you get a fever, chills or sore throat, or other symptoms of a cold or flu. Do not treat yourself. This drug decreases your body's ability to fight infections. Try to avoid being around people who are sick. This medicine may increase your risk to bruise or bleed. Call your doctor or health care professional if you notice any unusual bleeding. Be careful brushing and flossing your teeth or using a toothpick because you may get an infection or bleed more easily. If you have any dental work done, tell your dentist you are receiving this medicine. Avoid taking products that contain aspirin, acetaminophen, ibuprofen, naproxen, or ketoprofen unless instructed by your doctor. These medicines may hide a fever. Do not become pregnant while taking this medicine. Women should inform their doctor if they wish to become pregnant or think they might be pregnant. There is a potential for serious side effects to an unborn child. Talk to your health care professional or pharmacist for more information. Do not breast-feed an infant while taking this medicine. Men are advised not to father a child while receiving this medicine. This product may contain alcohol. Ask your pharmacist or healthcare provider if this medicine contains alcohol. Be sure to tell all healthcare providers you are taking this medicine. Certain medicines, like metronidazole and disulfiram, can cause an unpleasant reaction when  taken with alcohol. The reaction includes flushing, headache, nausea, vomiting, sweating, and increased thirst. The reaction can last from 30 minutes to several hours. What side effects may I notice from receiving this medicine? Side effects that you should report to your doctor or health care professional as soon as possible: -allergic reactions like skin rash, itching or hives, swelling of the face, lips, or tongue -low blood counts - This drug may decrease the number of white blood cells, red blood cells and platelets. You may be at increased risk for infections and bleeding. -signs of infection - fever or chills, cough, sore throat, pain or difficulty passing urine -signs of decreased platelets or bleeding - bruising, pinpoint red spots on the skin, black, tarry stools, nosebleeds -signs of decreased red blood cells - unusually weak or tired, fainting spells, lightheadedness -breathing problems -chest pain -high or low blood pressure -mouth sores -nausea and vomiting -pain, swelling, redness or irritation at the injection site -pain, tingling, numbness in the hands or feet -slow or irregular heartbeat -swelling of the ankle, feet, hands Side effects that usually do not require medical attention (report to your doctor or health care professional if they continue or are bothersome): -bone pain -complete hair loss including hair on your head, underarms, pubic hair, eyebrows, and eyelashes -changes in the color of fingernails -diarrhea -loosening of the fingernails -loss of appetite -muscle or joint pain -red flush to skin -sweating This list may not describe all possible side effects. Call your doctor for medical advice about side effects. You may report side effects to FDA at 1-800-FDA-1088. Where should I keep my medicine?   This drug is given in a hospital or clinic and will not be stored at home. NOTE: This sheet is a summary. It may not cover all possible information. If you have  questions about this medicine, talk to your doctor, pharmacist, or health care provider.  2018 Elsevier/Gold Standard (2014-10-23 19:58:00)  Carboplatin injection What is this medicine? CARBOPLATIN (KAR boe pla tin) is a chemotherapy drug. It targets fast dividing cells, like cancer cells, and causes these cells to die. This medicine is used to treat ovarian cancer and many other cancers. This medicine may be used for other purposes; ask your health care provider or pharmacist if you have questions. COMMON BRAND NAME(S): Paraplatin What should I tell my health care provider before I take this medicine? They need to know if you have any of these conditions: -blood disorders -hearing problems -kidney disease -recent or ongoing radiation therapy -an unusual or allergic reaction to carboplatin, cisplatin, other chemotherapy, other medicines, foods, dyes, or preservatives -pregnant or trying to get pregnant -breast-feeding How should I use this medicine? This drug is usually given as an infusion into a vein. It is administered in a hospital or clinic by a specially trained health care professional. Talk to your pediatrician regarding the use of this medicine in children. Special care may be needed. Overdosage: If you think you have taken too much of this medicine contact a poison control center or emergency room at once. NOTE: This medicine is only for you. Do not share this medicine with others. What if I miss a dose? It is important not to miss a dose. Call your doctor or health care professional if you are unable to keep an appointment. What may interact with this medicine? -medicines for seizures -medicines to increase blood counts like filgrastim, pegfilgrastim, sargramostim -some antibiotics like amikacin, gentamicin, neomycin, streptomycin, tobramycin -vaccines Talk to your doctor or health care professional before taking any of these  medicines: -acetaminophen -aspirin -ibuprofen -ketoprofen -naproxen This list may not describe all possible interactions. Give your health care provider a list of all the medicines, herbs, non-prescription drugs, or dietary supplements you use. Also tell them if you smoke, drink alcohol, or use illegal drugs. Some items may interact with your medicine. What should I watch for while using this medicine? Your condition will be monitored carefully while you are receiving this medicine. You will need important blood work done while you are taking this medicine. This drug may make you feel generally unwell. This is not uncommon, as chemotherapy can affect healthy cells as well as cancer cells. Report any side effects. Continue your course of treatment even though you feel ill unless your doctor tells you to stop. In some cases, you may be given additional medicines to help with side effects. Follow all directions for their use. Call your doctor or health care professional for advice if you get a fever, chills or sore throat, or other symptoms of a cold or flu. Do not treat yourself. This drug decreases your body's ability to fight infections. Try to avoid being around people who are sick. This medicine may increase your risk to bruise or bleed. Call your doctor or health care professional if you notice any unusual bleeding. Be careful brushing and flossing your teeth or using a toothpick because you may get an infection or bleed more easily. If you have any dental work done, tell your dentist you are receiving this medicine. Avoid taking products that contain aspirin, acetaminophen, ibuprofen, naproxen, or ketoprofen unless instructed   by your doctor. These medicines may hide a fever. Do not become pregnant while taking this medicine. Women should inform their doctor if they wish to become pregnant or think they might be pregnant. There is a potential for serious side effects to an unborn child. Talk to  your health care professional or pharmacist for more information. Do not breast-feed an infant while taking this medicine. What side effects may I notice from receiving this medicine? Side effects that you should report to your doctor or health care professional as soon as possible: -allergic reactions like skin rash, itching or hives, swelling of the face, lips, or tongue -signs of infection - fever or chills, cough, sore throat, pain or difficulty passing urine -signs of decreased platelets or bleeding - bruising, pinpoint red spots on the skin, black, tarry stools, nosebleeds -signs of decreased red blood cells - unusually weak or tired, fainting spells, lightheadedness -breathing problems -changes in hearing -changes in vision -chest pain -high blood pressure -low blood counts - This drug may decrease the number of white blood cells, red blood cells and platelets. You may be at increased risk for infections and bleeding. -nausea and vomiting -pain, swelling, redness or irritation at the injection site -pain, tingling, numbness in the hands or feet -problems with balance, talking, walking -trouble passing urine or change in the amount of urine Side effects that usually do not require medical attention (report to your doctor or health care professional if they continue or are bothersome): -hair loss -loss of appetite -metallic taste in the mouth or changes in taste This list may not describe all possible side effects. Call your doctor for medical advice about side effects. You may report side effects to FDA at 1-800-FDA-1088. Where should I keep my medicine? This drug is given in a hospital or clinic and will not be stored at home. NOTE: This sheet is a summary. It may not cover all possible information. If you have questions about this medicine, talk to your doctor, pharmacist, or health care provider.  2018 Elsevier/Gold Standard (2007-03-29 14:38:05)   

## 2016-05-20 NOTE — Progress Notes (Signed)
Went to treatment area to introduce myself as Arboriculturist for patient whom is uninsured. Asked patient if she had any financial questions or concerns regarding treatment or prescriptions given. Patient states she appreciates having someone who helps with applications for treatment drugs(Rob). She states she was not given any prescriptions. I gave patient my card for any additional questions or concerns. I went to ask Tammi RN if patient received any prescriptions and was told to ask the physician. Spoke with physician whom informed me that she gave them to the patient yesterday and she placed them in her bag. She advised me that they are responsible for filling them at the facility she is currently residing in while she is there. I went back to patient to relay this information to her. She states she will look in her bag again but she looked previously and could not find her schedule or any other papers. Advised her that she may go to scheduling when she is done to get a new schedule if needed. I advised her to call if unable to locate prescriptions. Asked Tammi RN to contact facility charge nurse to find out if they had her prescriptions. Rob will get the name and number of the charge nurse and give to Twilight.   Patient has no additional financial needs at this time and the hospital resource specialist Adam B has submitted an application for Medicaid and will follow.

## 2016-05-21 ENCOUNTER — Telehealth: Payer: Self-pay

## 2016-05-21 ENCOUNTER — Ambulatory Visit (HOSPITAL_BASED_OUTPATIENT_CLINIC_OR_DEPARTMENT_OTHER): Payer: Medicaid Other

## 2016-05-21 VITALS — BP 127/78 | HR 75 | Temp 97.0°F | Resp 18

## 2016-05-21 DIAGNOSIS — E538 Deficiency of other specified B group vitamins: Secondary | ICD-10-CM

## 2016-05-21 DIAGNOSIS — D509 Iron deficiency anemia, unspecified: Secondary | ICD-10-CM

## 2016-05-21 DIAGNOSIS — C569 Malignant neoplasm of unspecified ovary: Secondary | ICD-10-CM

## 2016-05-21 MED ORDER — CYANOCOBALAMIN 1000 MCG/ML IJ SOLN
1000.0000 ug | Freq: Once | INTRAMUSCULAR | Status: AC
  Administered 2016-05-21: 1000 ug via INTRAMUSCULAR

## 2016-05-21 MED ORDER — PEGFILGRASTIM INJECTION 6 MG/0.6ML ~~LOC~~
6.0000 mg | PREFILLED_SYRINGE | Freq: Once | SUBCUTANEOUS | Status: AC
  Administered 2016-05-21: 6 mg via SUBCUTANEOUS
  Filled 2016-05-21: qty 0.6

## 2016-05-21 NOTE — Telephone Encounter (Signed)
-----   Message from Arty Baumgartner, RN sent at 05/20/2016 12:46 PM EDT ----- Regarding: New chemo/ [facility]  1st time taxol/carbo.  Pt tolerated well.  Pt at facility [facility].  Alvy Bimler.

## 2016-05-21 NOTE — Patient Instructions (Signed)
Pegfilgrastim injection What is this medicine? PEGFILGRASTIM (PEG fil gra stim) is a long-acting granulocyte colony-stimulating factor that stimulates the growth of neutrophils, a type of white blood cell important in the body's fight against infection. It is used to reduce the incidence of fever and infection in patients with certain types of cancer who are receiving chemotherapy that affects the bone marrow, and to increase survival after being exposed to high doses of radiation. This medicine may be used for other purposes; ask your health care provider or pharmacist if you have questions. COMMON BRAND NAME(S): Neulasta What should I tell my health care provider before I take this medicine? They need to know if you have any of these conditions: -kidney disease -latex allergy -ongoing radiation therapy -sickle cell disease -skin reactions to acrylic adhesives (On-Body Injector only) -an unusual or allergic reaction to pegfilgrastim, filgrastim, other medicines, foods, dyes, or preservatives -pregnant or trying to get pregnant -breast-feeding How should I use this medicine? This medicine is for injection under the skin. If you get this medicine at home, you will be taught how to prepare and give the pre-filled syringe or how to use the On-body Injector. Refer to the patient Instructions for Use for detailed instructions. Use exactly as directed. Tell your healthcare provider immediately if you suspect that the On-body Injector may not have performed as intended or if you suspect the use of the On-body Injector resulted in a missed or partial dose. It is important that you put your used needles and syringes in a special sharps container. Do not put them in a trash can. If you do not have a sharps container, call your pharmacist or healthcare provider to get one. Talk to your pediatrician regarding the use of this medicine in children. While this drug may be prescribed for selected conditions,  precautions do apply. Overdosage: If you think you have taken too much of this medicine contact a poison control center or emergency room at once. NOTE: This medicine is only for you. Do not share this medicine with others. What if I miss a dose? It is important not to miss your dose. Call your doctor or health care professional if you miss your dose. If you miss a dose due to an On-body Injector failure or leakage, a new dose should be administered as soon as possible using a single prefilled syringe for manual use. What may interact with this medicine? Interactions have not been studied. Give your health care provider a list of all the medicines, herbs, non-prescription drugs, or dietary supplements you use. Also tell them if you smoke, drink alcohol, or use illegal drugs. Some items may interact with your medicine. This list may not describe all possible interactions. Give your health care provider a list of all the medicines, herbs, non-prescription drugs, or dietary supplements you use. Also tell them if you smoke, drink alcohol, or use illegal drugs. Some items may interact with your medicine. What should I watch for while using this medicine? You may need blood work done while you are taking this medicine. If you are going to need a MRI, CT scan, or other procedure, tell your doctor that you are using this medicine (On-Body Injector only). What side effects may I notice from receiving this medicine? Side effects that you should report to your doctor or health care professional as soon as possible: -allergic reactions like skin rash, itching or hives, swelling of the face, lips, or tongue -dizziness -fever -pain, redness, or irritation at site   where injected -pinpoint red spots on the skin -red or dark-brown urine -shortness of breath or breathing problems -stomach or side pain, or pain at the shoulder -swelling -tiredness -trouble passing urine or change in the amount of urine Side  effects that usually do not require medical attention (report to your doctor or health care professional if they continue or are bothersome): -bone pain -muscle pain This list may not describe all possible side effects. Call your doctor for medical advice about side effects. You may report side effects to FDA at 1-800-FDA-1088. Where should I keep my medicine? Keep out of the reach of children. Store pre-filled syringes in a refrigerator between 2 and 8 degrees C (36 and 46 degrees F). Do not freeze. Keep in carton to protect from light. Throw away this medicine if it is left out of the refrigerator for more than 48 hours. Throw away any unused medicine after the expiration date. NOTE: This sheet is a summary. It may not cover all possible information. If you have questions about this medicine, talk to your doctor, pharmacist, or health care provider.  2018 Elsevier/Gold Standard (2015-12-19 12:58:03) Cyanocobalamin, Vitamin B12 injection What is this medicine? CYANOCOBALAMIN (sye an oh koe BAL a min) is a man made form of vitamin B12. Vitamin B12 is used in the growth of healthy blood cells, nerve cells, and proteins in the body. It also helps with the metabolism of fats and carbohydrates. This medicine is used to treat people who can not absorb vitamin B12. This medicine may be used for other purposes; ask your health care provider or pharmacist if you have questions. COMMON BRAND NAME(S): B-12 Compliance Kit, B-12 Injection Kit, Cyomin, LA-12, Nutri-Twelve, Physicians EZ Use B-12, Primabalt What should I tell my health care provider before I take this medicine? They need to know if you have any of these conditions: -kidney disease -Leber's disease -megaloblastic anemia -an unusual or allergic reaction to cyanocobalamin, cobalt, other medicines, foods, dyes, or preservatives -pregnant or trying to get pregnant -breast-feeding How should I use this medicine? This medicine is injected into a  muscle or deeply under the skin. It is usually given by a health care professional in a clinic or doctor's office. However, your doctor may teach you how to inject yourself. Follow all instructions. Talk to your pediatrician regarding the use of this medicine in children. Special care may be needed. Overdosage: If you think you have taken too much of this medicine contact a poison control center or emergency room at once. NOTE: This medicine is only for you. Do not share this medicine with others. What if I miss a dose? If you are given your dose at a clinic or doctor's office, call to reschedule your appointment. If you give your own injections and you miss a dose, take it as soon as you can. If it is almost time for your next dose, take only that dose. Do not take double or extra doses. What may interact with this medicine? -colchicine -heavy alcohol intake This list may not describe all possible interactions. Give your health care provider a list of all the medicines, herbs, non-prescription drugs, or dietary supplements you use. Also tell them if you smoke, drink alcohol, or use illegal drugs. Some items may interact with your medicine. What should I watch for while using this medicine? Visit your doctor or health care professional regularly. You may need blood work done while you are taking this medicine. You may need to follow a special  diet. Talk to your doctor. Limit your alcohol intake and avoid smoking to get the best benefit. What side effects may I notice from receiving this medicine? Side effects that you should report to your doctor or health care professional as soon as possible: -allergic reactions like skin rash, itching or hives, swelling of the face, lips, or tongue -blue tint to skin -chest tightness, pain -difficulty breathing, wheezing -dizziness -red, swollen painful area on the leg Side effects that usually do not require medical attention (report to your doctor or health  care professional if they continue or are bothersome): -diarrhea -headache This list may not describe all possible side effects. Call your doctor for medical advice about side effects. You may report side effects to FDA at 1-800-FDA-1088. Where should I keep my medicine? Keep out of the reach of children. Store at room temperature between 15 and 30 degrees C (59 and 85 degrees F). Protect from light. Throw away any unused medicine after the expiration date. NOTE: This sheet is a summary. It may not cover all possible information. If you have questions about this medicine, talk to your doctor, pharmacist, or health care provider.  2018 Elsevier/Gold Standard (2007-04-04 22:10:20)

## 2016-05-21 NOTE — Telephone Encounter (Signed)
Called and left message to call for any problems.

## 2016-05-30 ENCOUNTER — Telehealth: Payer: Self-pay | Admitting: Hematology and Oncology

## 2016-05-30 NOTE — Telephone Encounter (Signed)
S/w pt, gave appt for 6/6 @ 7.45am. Pt says she was on fluid restrictions but her situation has changed (no longer has swelling in the legs). She wants to know how to get off of fluid restrictions. I advised pt she wjould need to s/w a nurse. I have left a vm for Dr. Calton Dach desk nurse to call pt on Tuesday.

## 2016-06-04 ENCOUNTER — Telehealth: Payer: Self-pay

## 2016-06-04 ENCOUNTER — Other Ambulatory Visit: Payer: Self-pay | Admitting: Hematology and Oncology

## 2016-06-04 NOTE — Telephone Encounter (Signed)
Pt has a restriction on fluids 1500 ml from [facility] discharge on 5/2. [facility] nurses are pushing fluids d/t chemo. She is not getting much fluids at the rehab center. She has no more swelling in her feet. She is at [facility] until she finishes chemo b/c she has no assistance at home. Can the fluid restriction be modified or lifted?

## 2016-06-04 NOTE — Telephone Encounter (Signed)
From what I remembered from last visit, her abdomen was grossly distended with ascites I would stick with 1500 ml for now until her next appt

## 2016-06-04 NOTE — Telephone Encounter (Signed)
S/w pt per Dr Calton Dach note. She is agreeable.

## 2016-06-09 ENCOUNTER — Encounter: Payer: Self-pay | Admitting: Pharmacy Technician

## 2016-06-09 NOTE — Progress Notes (Signed)
The patient is approved by [employer] for Feraheme. Coverage is from 05/22/16 - 05/22/17.

## 2016-06-10 ENCOUNTER — Telehealth: Payer: Self-pay | Admitting: Hematology and Oncology

## 2016-06-10 ENCOUNTER — Ambulatory Visit (HOSPITAL_BASED_OUTPATIENT_CLINIC_OR_DEPARTMENT_OTHER): Payer: Medicaid Other | Admitting: Hematology and Oncology

## 2016-06-10 ENCOUNTER — Encounter: Payer: Self-pay | Admitting: Hematology and Oncology

## 2016-06-10 ENCOUNTER — Ambulatory Visit: Payer: Self-pay

## 2016-06-10 ENCOUNTER — Other Ambulatory Visit (HOSPITAL_BASED_OUTPATIENT_CLINIC_OR_DEPARTMENT_OTHER): Payer: Medicaid Other

## 2016-06-10 ENCOUNTER — Telehealth: Payer: Self-pay | Admitting: *Deleted

## 2016-06-10 ENCOUNTER — Ambulatory Visit (HOSPITAL_BASED_OUTPATIENT_CLINIC_OR_DEPARTMENT_OTHER): Payer: Medicaid Other

## 2016-06-10 VITALS — BP 111/72 | HR 91 | Temp 97.8°F | Resp 18 | Wt 155.2 lb

## 2016-06-10 DIAGNOSIS — D519 Vitamin B12 deficiency anemia, unspecified: Secondary | ICD-10-CM

## 2016-06-10 DIAGNOSIS — D509 Iron deficiency anemia, unspecified: Secondary | ICD-10-CM

## 2016-06-10 DIAGNOSIS — C786 Secondary malignant neoplasm of retroperitoneum and peritoneum: Secondary | ICD-10-CM | POA: Diagnosis not present

## 2016-06-10 DIAGNOSIS — R188 Other ascites: Secondary | ICD-10-CM | POA: Diagnosis not present

## 2016-06-10 DIAGNOSIS — C561 Malignant neoplasm of right ovary: Secondary | ICD-10-CM

## 2016-06-10 DIAGNOSIS — E538 Deficiency of other specified B group vitamins: Secondary | ICD-10-CM | POA: Diagnosis not present

## 2016-06-10 DIAGNOSIS — C569 Malignant neoplasm of unspecified ovary: Secondary | ICD-10-CM

## 2016-06-10 DIAGNOSIS — R180 Malignant ascites: Secondary | ICD-10-CM

## 2016-06-10 DIAGNOSIS — E43 Unspecified severe protein-calorie malnutrition: Secondary | ICD-10-CM | POA: Diagnosis not present

## 2016-06-10 DIAGNOSIS — Z5111 Encounter for antineoplastic chemotherapy: Secondary | ICD-10-CM | POA: Diagnosis present

## 2016-06-10 LAB — COMPREHENSIVE METABOLIC PANEL
ALBUMIN: 1.9 g/dL — AB (ref 3.5–5.0)
ALT: <6 U/L (ref 0–55)
AST: 9 U/L (ref 5–34)
Alkaline Phosphatase: 109 U/L (ref 40–150)
Anion Gap: 12 mEq/L — ABNORMAL HIGH (ref 3–11)
BUN: 29.9 mg/dL — AB (ref 7.0–26.0)
CO2: 30 meq/L — AB (ref 22–29)
Calcium: 8.6 mg/dL (ref 8.4–10.4)
Chloride: 92 mEq/L — ABNORMAL LOW (ref 98–109)
Creatinine: 0.9 mg/dL (ref 0.6–1.1)
EGFR: 69 mL/min/{1.73_m2} — AB (ref >90)
GLUCOSE: 135 mg/dL (ref 70–140)
POTASSIUM: 3.9 meq/L (ref 3.5–5.1)
SODIUM: 134 meq/L — AB (ref 136–145)
TOTAL PROTEIN: 6.5 g/dL (ref 6.4–8.3)
Total Bilirubin: 0.86 mg/dL (ref 0.20–1.20)

## 2016-06-10 LAB — CBC WITH DIFFERENTIAL/PLATELET
BASO%: 0.5 % (ref 0.0–2.0)
Basophils Absolute: 0.1 10*3/uL (ref 0.0–0.1)
EOS ABS: 0 10*3/uL (ref 0.0–0.5)
EOS%: 0.1 % (ref 0.0–7.0)
HCT: 27.2 % — ABNORMAL LOW (ref 34.8–46.6)
HEMOGLOBIN: 8.6 g/dL — AB (ref 11.6–15.9)
LYMPH%: 5.8 % — AB (ref 14.0–49.7)
MCH: 24.5 pg — AB (ref 25.1–34.0)
MCHC: 31.8 g/dL (ref 31.5–36.0)
MCV: 77.1 fL — AB (ref 79.5–101.0)
MONO#: 0.6 10*3/uL (ref 0.1–0.9)
MONO%: 2.9 % (ref 0.0–14.0)
NEUT%: 90.7 % — ABNORMAL HIGH (ref 38.4–76.8)
NEUTROS ABS: 17.8 10*3/uL — AB (ref 1.5–6.5)
Platelets: 281 10*3/uL (ref 145–400)
RBC: 3.53 10*6/uL — AB (ref 3.70–5.45)
RDW: 25 % — AB (ref 11.2–14.5)
WBC: 19.7 10*3/uL — AB (ref 3.9–10.3)
lymph#: 1.1 10*3/uL (ref 0.9–3.3)

## 2016-06-10 MED ORDER — PALONOSETRON HCL INJECTION 0.25 MG/5ML
INTRAVENOUS | Status: AC
  Filled 2016-06-10: qty 5

## 2016-06-10 MED ORDER — FAMOTIDINE IN NACL 20-0.9 MG/50ML-% IV SOLN
20.0000 mg | Freq: Once | INTRAVENOUS | Status: AC
  Administered 2016-06-10: 20 mg via INTRAVENOUS

## 2016-06-10 MED ORDER — HEPARIN SOD (PORK) LOCK FLUSH 100 UNIT/ML IV SOLN
250.0000 [IU] | Freq: Once | INTRAVENOUS | Status: AC | PRN
  Administered 2016-06-10: 250 [IU]
  Filled 2016-06-10: qty 5

## 2016-06-10 MED ORDER — FAMOTIDINE IN NACL 20-0.9 MG/50ML-% IV SOLN
INTRAVENOUS | Status: AC
  Filled 2016-06-10: qty 50

## 2016-06-10 MED ORDER — SODIUM CHLORIDE 0.9% FLUSH
10.0000 mL | INTRAVENOUS | Status: DC | PRN
  Administered 2016-06-10: 10 mL
  Filled 2016-06-10: qty 10

## 2016-06-10 MED ORDER — PALONOSETRON HCL INJECTION 0.25 MG/5ML
0.2500 mg | Freq: Once | INTRAVENOUS | Status: AC
  Administered 2016-06-10: 0.25 mg via INTRAVENOUS

## 2016-06-10 MED ORDER — SODIUM CHLORIDE 0.9 % IV SOLN
600.0000 mg | Freq: Once | INTRAVENOUS | Status: AC
  Administered 2016-06-10: 600 mg via INTRAVENOUS
  Filled 2016-06-10: qty 60

## 2016-06-10 MED ORDER — PACLITAXEL CHEMO INJECTION 300 MG/50ML: 175.0000 mg/m2 | Freq: Once | INTRAVENOUS | Status: DC

## 2016-06-10 MED ORDER — SODIUM CHLORIDE 0.9 % IV SOLN
20.0000 mg | Freq: Once | INTRAVENOUS | Status: AC
  Administered 2016-06-10: 20 mg via INTRAVENOUS
  Filled 2016-06-10: qty 2

## 2016-06-10 MED ORDER — SODIUM CHLORIDE 0.9% FLUSH
10.0000 mL | Freq: Once | INTRAVENOUS | Status: AC
  Administered 2016-06-10: 10 mL
  Filled 2016-06-10: qty 10

## 2016-06-10 MED ORDER — PACLITAXEL CHEMO INJECTION 300 MG/50ML
175.0000 mg/m2 | Freq: Once | INTRAVENOUS | Status: AC
  Administered 2016-06-10: 318 mg via INTRAVENOUS
  Filled 2016-06-10: qty 53

## 2016-06-10 MED ORDER — SODIUM CHLORIDE 0.9 % IV SOLN
Freq: Once | INTRAVENOUS | Status: AC
  Administered 2016-06-10: 09:00:00 via INTRAVENOUS

## 2016-06-10 MED ORDER — DIPHENHYDRAMINE HCL 50 MG/ML IJ SOLN
INTRAMUSCULAR | Status: AC
  Filled 2016-06-10: qty 1

## 2016-06-10 MED ORDER — DIPHENHYDRAMINE HCL 50 MG/ML IJ SOLN
50.0000 mg | Freq: Once | INTRAMUSCULAR | Status: AC
  Administered 2016-06-10: 50 mg via INTRAVENOUS

## 2016-06-10 NOTE — Patient Instructions (Signed)
[facility] Discharge Instructions for Patients Receiving Chemotherapy  Today you received the following chemotherapy agents Taxol and Carboplatin. To help prevent nausea and vomiting after your treatment, we encourage you to take your nausea medication as directed.  If you develop nausea and vomiting that is not controlled by your nausea medication, call the clinic.   BELOW ARE SYMPTOMS THAT SHOULD BE REPORTED IMMEDIATELY:  *FEVER GREATER THAN 100.5 F  *CHILLS WITH OR WITHOUT FEVER  NAUSEA AND VOMITING THAT IS NOT CONTROLLED WITH YOUR NAUSEA MEDICATION  *UNUSUAL SHORTNESS OF BREATH  *UNUSUAL BRUISING OR BLEEDING  TENDERNESS IN MOUTH AND THROAT WITH OR WITHOUT PRESENCE OF ULCERS  *URINARY PROBLEMS  *BOWEL PROBLEMS  UNUSUAL RASH Items with * indicate a potential emergency and should be followed up as soon as possible.  Feel free to call the clinic you have any questions or concerns. The clinic phone number is [phone].  Please show the CHEMO ALERT CARD at check-in to the Emergency Department and triage nurse.    

## 2016-06-10 NOTE — Assessment & Plan Note (Signed)
Majority of the ascites had resolved.  Her abdomen appears distended still. We will repeat CT imaging study at the end of the month to assess

## 2016-06-10 NOTE — Telephone Encounter (Signed)
Appointments scheduled per 06/10/16 los. Patient was given a copy of the AVS report and appointment schedule per 06/10/16 los.

## 2016-06-10 NOTE — Telephone Encounter (Signed)
CT scheduled for 6/25 @ 1030. Instructions and contrast bottles given to patient and caregiver.

## 2016-06-10 NOTE — Progress Notes (Signed)
[facility] OFFICE PROGRESS NOTE  Patient Care Team: Patient, No Pcp Per as PCP - General (General Practice)  SUMMARY OF ONCOLOGIC HISTORY:   Ovarian cancer ([place])   04/25/2016 Imaging    CT abdomen:  1. 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 Consultation with Gynecology is recommended. 2. Small left pleural effusion. 3. Aortic atherosclerosis, in addition to 2 vessel coronary artery disease.  4. Cholelithiasis.      04/25/2016 - 05/06/2016 Hospital Admission    The patient was admitted to the hospital for evaluation of severe anemia, abdominal bloating.  She received blood transfusion.  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       04/26/2016 Pathology Results    PERITONEAL /ASCITIC FLUID (SPECIMEN 1 OF 1, COLLECTED ON 04/26/16): ACUTE INFLAMMATION. NO MALIGNANT CELLS IDENTIFIED.      04/26/2016 Tumor Marker    Patient's tumor was tested for the following markers: CA125 Results of the tumor marker test revealed 227.7      04/27/2016 Imaging    1. No CT evidence of pulmonary artery embolus. 2. Areas of consolidative change involving the left lower lobe as well as scattered pulmonary densities in the left upper lobe. Findings may represent pneumonia. Correlation with clinical exam recommended. 3. Overall decreased left lung volume with leftward shift of the mediastinum and heart. 4. Segmental air trapping in the right middle lobe with diffuse haziness of the right upper and right lower lobes. 5. Ascites.       04/27/2016 Imaging    ECHO - Left ventricle: The cavity size was normal. There  was moderate concentric hypertrophy. Systolic function was normal. The estimated ejection fraction was in the range of 60% to 65%. Wall motion was normal; there were no regional wall motion abnormalities. Features are consistent with a pseudonormal left ventricular filling pattern, with concomitant abnormal relaxation and increased filling pressure (grade 2 diastolic dysfunction). - Tricuspid valve: There was mild-moderate regurgitation directed centrally. - Pulmonary arteries: Systolic pressure was moderately increased. PA peak pressure: 58 mm Hg (S).      04/28/2016 Pathology Results    PERITONEAL /ASCITIC FLUID (SPECIMEN 1 OF 1, COLLECTED ON 04/28/2016): NO MALIGNANT CELLS IDENTIFIED. ACUTE INFLAMMATION.      04/28/2016 Procedure    Successful ultrasound-guided diagnostic and therapeutic paracentesis yielding 530 liters of peritoneal fluid.      05/01/2016 Pathology Results    Soft Tissue Needle Core Biopsy, Omental - POORLY DIFFERENTIATED HIGH GRADE MALIGNANCY, SEE COMMENT. Microscopic Comment Core biopsies reveal nests of malignant cells. Immunohistochemistry is positive for WT-1, ER, p53, PAX-8 (Very weak). Pancytokeratin, cytokeratin 7, cytokeratin 20, cytokeratin 5/6, cytokeratin 903, cytokeratin 8/18, EMA, calretinin, and MOC31 are negative. The immunohistochemical profile, while not entirely specific, is suggestive of a gynecologic primary, especially given the radiographic findings.       05/01/2016 Procedure    Successful CT-guided anterior omental mass 18 gauge core biopsy      05/05/2016 Procedure    Peripherally Inserted Central Catheter/Midline Placement       INTERVAL HISTORY: Please see below for problem oriented charting. She is seen prior to cycle 2 of chemotherapy. She have lost over 25 pounds of weight due to loss of ascites. Her legs are less  swollen.  Her abdominal distention is still present but less She had recent hemorrhoidal bleeding.  She denies nausea or  severe constipation from treatment.  No peripheral neuropathy The patient denies any recent signs or symptoms of bleeding such as spontaneous epistaxis, hematuria or hematochezia.   REVIEW OF SYSTEMS:   Constitutional: Denies fevers, chills Eyes: Denies blurriness of vision Ears, nose, mouth, throat, and face: Denies mucositis or sore throat Respiratory: Denies cough, dyspnea or wheezes Cardiovascular: Denies palpitation, chest discomfort or lower extremity swelling Gastrointestinal:  Denies nausea, heartburn or change in bowel habits Skin: Denies abnormal skin rashes Lymphatics: Denies new lymphadenopathy or easy bruising Neurological:Denies numbness, tingling or new weaknesses Behavioral/Psych: Mood is stable, no new changes  All other systems were reviewed with the patient and are negative.  I have reviewed the past medical history, past surgical history, social history and family history with the patient and they are unchanged from previous note.  ALLERGIES:  is allergic to pseudoephedrine.  MEDICATIONS:  Current Outpatient Prescriptions  Medication Sig Dispense Refill  . aspirin EC 81 MG tablet Take 1 tablet (81 mg total) by mouth 2 (two) times daily. 30 tablet 0  . ceFAZolin (ANCEF) 2-4 GM/100ML-% IVPB Inject 100 mLs (2 g total) into the vein every 8 (eight) hours. Stop date 05-20-16 1 each 0  . dexamethasone (DECADRON) 4 MG tablet Take 5 tablets at 6 am by mouth on the days of chemo only,e very 3 weeks, starting 05/20/16 30 tablet 1  . feeding supplement (BOOST / RESOURCE BREEZE) LIQD Take 1 Container by mouth 3 (three) times daily between meals.  0  . ferrous sulfate 325 (65 FE) MG tablet Take 1 tablet (325 mg total) by mouth 2 (two) times daily with a meal.  3  . furosemide (LASIX) 40 MG tablet Take 1 tablet (40 mg total) by mouth 2 (two) times daily. 30 tablet   . lidocaine-prilocaine (EMLA) cream Apply to affected area once 30 g 3  . metoprolol tartrate (LOPRESSOR) 25 MG  tablet Take 1 tablet (25 mg total) by mouth 2 (two) times daily.    . ondansetron (ZOFRAN) 8 MG tablet Take 1 tablet (8 mg total) by mouth 2 (two) times daily as needed for refractory nausea / vomiting. Start on day 3 after chemo. 30 tablet 1  . oxyCODONE (OXY IR/ROXICODONE) 5 MG immediate release tablet Take 1 tablet (5 mg total) by mouth every 6 (six) hours as needed for moderate pain. 15 tablet 0  . pantoprazole (PROTONIX) 40 MG tablet Take 1 tablet (40 mg total) by mouth daily.    . polyethylene glycol (MIRALAX / GLYCOLAX) packet Take 17 g by mouth daily. 14 each 0  . potassium chloride SA (K-DUR,KLOR-CON) 20 MEQ tablet Take 1 tablet (20 mEq total) by mouth daily.    . prochlorperazine (COMPAZINE) 10 MG tablet Take 1 tablet (10 mg total) by mouth every 6 (six) hours as needed (Nausea or vomiting). 30 tablet 1  . spironolactone (ALDACTONE) 25 MG tablet Take 1 tablet (25 mg total) by mouth daily. (Patient not taking: Reported on 05/19/2016)    . vitamin B-12 1000 MCG tablet Take 1 tablet (1,000 mcg total) by mouth daily.     No current facility-administered medications for this visit.     PHYSICAL EXAMINATION: ECOG PERFORMANCE STATUS: 2 - Symptomatic, <50% confined to bed  Vitals:   06/10/16 0844  BP: 111/72  Pulse: 91  Resp: 18  Temp: 97.8 F (36.6 C)   Filed  Weights   06/10/16 0844  Weight: 155 lb 3.2 oz (70.4 kg)    GENERAL:alert, no distress and comfortable.  Poor personal hygiene SKIN: skin color, texture, turgor are normal, no rashes or significant lesions.  Noted significant bruises EYES: normal, Conjunctiva are pale and non-injected, sclera clear OROPHARYNX:no exudate, no erythema and lips, buccal mucosa, and tongue normal  NECK: supple, thyroid normal size, non-tender, without nodularity LYMPH:  no palpable lymphadenopathy in the cervical, axillary or inguinal LUNGS: clear to auscultation and percussion with normal breathing effort HEART: regular rate & rhythm and no  murmurs and no lower extremity edema ABDOMEN:abdomen soft, non-tender and normal bowel sounds.  Ascites is gone but is still distended with tumor Musculoskeletal:no cyanosis of digits and no clubbing  NEURO: alert & oriented x 3 with fluent speech, no focal motor/sensory deficits  LABORATORY DATA:  I have reviewed the data as listed    Component Value Date/Time   NA 134 (L) 06/10/2016 0807   K 3.9 06/10/2016 0807   CL 97 (L) 05/14/2016 2135   CO2 30 (H) 06/10/2016 0807   GLUCOSE 135 06/10/2016 0807   BUN 29.9 (H) 06/10/2016 0807   CREATININE 0.9 06/10/2016 0807   CALCIUM 8.6 06/10/2016 0807   PROT 6.5 06/10/2016 0807   ALBUMIN 1.9 (L) 06/10/2016 0807   AST 9 06/10/2016 0807   ALT <6 06/10/2016 0807   ALKPHOS 109 06/10/2016 0807   BILITOT 0.86 06/10/2016 0807   GFRNONAA >60 05/14/2016 2135   GFRAA >60 05/14/2016 2135    No results found for: SPEP, UPEP  Lab Results  Component Value Date   WBC 19.7 (H) 06/10/2016   NEUTROABS 17.8 (H) 06/10/2016   HGB 8.6 (L) 06/10/2016   HCT 27.2 (L) 06/10/2016   MCV 77.1 (L) 06/10/2016   PLT 281 06/10/2016      Chemistry      Component Value Date/Time   NA 134 (L) 06/10/2016 0807   K 3.9 06/10/2016 0807   CL 97 (L) 05/14/2016 2135   CO2 30 (H) 06/10/2016 0807   BUN 29.9 (H) 06/10/2016 0807   CREATININE 0.9 06/10/2016 0807      Component Value Date/Time   CALCIUM 8.6 06/10/2016 0807   ALKPHOS 109 06/10/2016 0807   AST 9 06/10/2016 0807   ALT <6 06/10/2016 0807   BILITOT 0.86 06/10/2016 0807       ASSESSMENT & PLAN:  Ovarian cancer ([place]) She has lost tremendous amount of fluid which I think was related to fluid retention and ascitic fluid In any case, I will proceed with cycle 2 of treatment and I plan to repeat imaging study before cycle 3 of therapy I will get assistance from the pharmacist to help with calculation of chemotherapy dose adjustment  Iron deficiency anemia She has severe iron deficiency anemia After  chemotherapy today, I would proceed with intravenous iron tomorrow. The most likely cause of her anemia is due to chronic blood loss/malabsorption syndrome. We discussed some of the risks, benefits, and alternatives of intravenous iron infusions. The patient is symptomatic from anemia and the iron level is critically low. She tolerated oral iron supplement poorly and desires to achieved higher levels of iron faster for adequate hematopoesis. Some of the side-effects to be expected including risks of infusion reactions, phlebitis, headaches, nausea and fatigue.  The patient is willing to proceed. Patient education material was dispensed.    B12 deficiency She was recently found to have severe vitamin B12 deficiency. She will continue B12  injection with each treatment  Ascites Majority of the ascites had resolved.  Her abdomen appears distended still. We will repeat CT imaging study at the end of the month to assess  Severe malnutrition ([place]) She has severe protein calorie malnutrition due to untreated cancer She is eating well Majority of the ascites had resolved.  She will continue to increase oral intake as tolerated   Orders Placed This Encounter  Procedures  . CT ABDOMEN PELVIS W CONTRAST    Standing Status:   Future    Standing Expiration Date:   07/15/2017    Order Specific Question:   Reason for exam:    Answer:   ovarian cancer, assess response to Rx    Order Specific Question:   Is the patient pregnant?    Answer:   No    Order Specific Question:   Preferred imaging location?    Answer:   [facility]   All questions were answered. The patient knows to call the clinic with any problems, questions or concerns. No barriers to learning was detected. I spent 30 minutes counseling the patient face to face. The total time spent in the appointment was 40 minutes and more than 50% was on counseling and review of test results     Heath Lark, MD 06/10/2016 9:23 AM

## 2016-06-10 NOTE — Assessment & Plan Note (Signed)
She has severe iron deficiency anemia After chemotherapy today, I would proceed with intravenous iron tomorrow. The most likely cause of her anemia is due to chronic blood loss/malabsorption syndrome. We discussed some of the risks, benefits, and alternatives of intravenous iron infusions. The patient is symptomatic from anemia and the iron level is critically low. She tolerated oral iron supplement poorly and desires to achieved higher levels of iron faster for adequate hematopoesis. Some of the side-effects to be expected including risks of infusion reactions, phlebitis, headaches, nausea and fatigue.  The patient is willing to proceed. Patient education material was dispensed.

## 2016-06-10 NOTE — Assessment & Plan Note (Signed)
She has severe protein calorie malnutrition due to untreated cancer She is eating well Majority of the ascites had resolved.  She will continue to increase oral intake as tolerated

## 2016-06-10 NOTE — Assessment & Plan Note (Addendum)
She has lost tremendous amount of fluid which I think was related to fluid retention and ascitic fluid In any case, I will proceed with cycle 2 of treatment and I plan to repeat imaging study before cycle 3 of therapy I will get assistance from the pharmacist to help with calculation of chemotherapy dose adjustment

## 2016-06-10 NOTE — Assessment & Plan Note (Signed)
She was recently found to have severe vitamin B12 deficiency. She will continue B12 injection with each treatment

## 2016-06-11 ENCOUNTER — Ambulatory Visit (HOSPITAL_BASED_OUTPATIENT_CLINIC_OR_DEPARTMENT_OTHER): Payer: Medicaid Other

## 2016-06-11 ENCOUNTER — Ambulatory Visit: Payer: Self-pay

## 2016-06-11 VITALS — BP 101/64 | HR 85 | Temp 98.4°F | Resp 20

## 2016-06-11 DIAGNOSIS — D509 Iron deficiency anemia, unspecified: Secondary | ICD-10-CM

## 2016-06-11 DIAGNOSIS — Z5189 Encounter for other specified aftercare: Secondary | ICD-10-CM | POA: Diagnosis not present

## 2016-06-11 DIAGNOSIS — C569 Malignant neoplasm of unspecified ovary: Secondary | ICD-10-CM

## 2016-06-11 LAB — CA 125: Cancer Antigen (CA) 125: 21.1 U/mL (ref 0.0–38.1)

## 2016-06-11 MED ORDER — SODIUM CHLORIDE 0.9 % IV SOLN
Freq: Once | INTRAVENOUS | Status: AC
  Administered 2016-06-11: 15:00:00 via INTRAVENOUS

## 2016-06-11 MED ORDER — SODIUM CHLORIDE 0.9% FLUSH
10.0000 mL | INTRAVENOUS | Status: DC | PRN
  Administered 2016-06-11: 10 mL
  Filled 2016-06-11: qty 10

## 2016-06-11 MED ORDER — ONDANSETRON HCL 4 MG/2ML IJ SOLN
INTRAMUSCULAR | Status: AC
  Filled 2016-06-11: qty 4

## 2016-06-11 MED ORDER — PEGFILGRASTIM INJECTION 6 MG/0.6ML ~~LOC~~
6.0000 mg | PREFILLED_SYRINGE | Freq: Once | SUBCUTANEOUS | Status: AC
  Administered 2016-06-11: 6 mg via SUBCUTANEOUS
  Filled 2016-06-11: qty 0.6

## 2016-06-11 MED ORDER — HEPARIN SOD (PORK) LOCK FLUSH 100 UNIT/ML IV SOLN
500.0000 [IU] | Freq: Once | INTRAVENOUS | Status: AC | PRN
  Administered 2016-06-11: 500 [IU]
  Filled 2016-06-11: qty 5

## 2016-06-11 MED ORDER — SODIUM CHLORIDE 0.9 % IV SOLN: Freq: Once | INTRAVENOUS | Status: DC

## 2016-06-11 MED ORDER — ONDANSETRON HCL 4 MG/2ML IJ SOLN: 8.0000 mg | Freq: Once | INTRAMUSCULAR | Status: DC

## 2016-06-11 MED ORDER — FERUMOXYTOL INJECTION 510 MG/17 ML
510.0000 mg | Freq: Once | INTRAVENOUS | Status: AC
  Administered 2016-06-11 (×2): 510 mg via INTRAVENOUS
  Filled 2016-06-11: qty 17

## 2016-06-11 MED ORDER — PROMETHAZINE HCL 25 MG/ML IJ SOLN
12.5000 mg | Freq: Once | INTRAMUSCULAR | Status: AC
  Administered 2016-06-11: 12.5 mg via INTRAVENOUS
  Filled 2016-06-11: qty 1

## 2016-06-11 NOTE — Patient Instructions (Addendum)
Ferumoxytol injection What is this medicine? FERUMOXYTOL is an iron complex. Iron is used to make healthy red blood cells, which carry oxygen and nutrients throughout the body. This medicine is used to treat iron deficiency anemia in people with chronic kidney disease. This medicine may be used for other purposes; ask your health care provider or pharmacist if you have questions. COMMON BRAND NAME(S): Feraheme What should I tell my health care provider before I take this medicine? They need to know if you have any of these conditions: -anemia not caused by low iron levels -high levels of iron in the blood -magnetic resonance imaging (MRI) test scheduled -an unusual or allergic reaction to iron, other medicines, foods, dyes, or preservatives -pregnant or trying to get pregnant -breast-feeding How should I use this medicine? This medicine is for injection into a vein. It is given by a health care professional in a hospital or clinic setting. Talk to your pediatrician regarding the use of this medicine in children. Special care may be needed. Overdosage: If you think you have taken too much of this medicine contact a poison control center or emergency room at once. NOTE: This medicine is only for you. Do not share this medicine with others. What if I miss a dose? It is important not to miss your dose. Call your doctor or health care professional if you are unable to keep an appointment. What may interact with this medicine? This medicine may interact with the following medications: -other iron products This list may not describe all possible interactions. Give your health care provider a list of all the medicines, herbs, non-prescription drugs, or dietary supplements you use. Also tell them if you smoke, drink alcohol, or use illegal drugs. Some items may interact with your medicine. What should I watch for while using this medicine? Visit your doctor or healthcare professional regularly. Tell  your doctor or healthcare professional if your symptoms do not start to get better or if they get worse. You may need blood work done while you are taking this medicine. You may need to follow a special diet. Talk to your doctor. Foods that contain iron include: whole grains/cereals, dried fruits, beans, or peas, leafy green vegetables, and organ meats (liver, kidney). What side effects may I notice from receiving this medicine? Side effects that you should report to your doctor or health care professional as soon as possible: -allergic reactions like skin rash, itching or hives, swelling of the face, lips, or tongue -breathing problems -changes in blood pressure -feeling faint or lightheaded, falls -fever or chills -flushing, sweating, or hot feelings -swelling of the ankles or feet Side effects that usually do not require medical attention (report to your doctor or health care professional if they continue or are bothersome): -diarrhea -headache -nausea, vomiting -stomach pain This list may not describe all possible side effects. Call your doctor for medical advice about side effects. You may report side effects to FDA at 1-800-FDA-1088. Where should I keep my medicine? This drug is given in a hospital or clinic and will not be stored at home. NOTE: This sheet is a summary. It may not cover all possible information. If you have questions about this medicine, talk to your doctor, pharmacist, or health care provider.  2018 Elsevier/Gold Standard (2015-01-24 12:41:49)  Pegfilgrastim injection What is this medicine? PEGFILGRASTIM (PEG fil gra stim) is a long-acting granulocyte colony-stimulating factor that stimulates the growth of neutrophils, a type of white blood cell important in the body's fight against  infection. It is used to reduce the incidence of fever and infection in patients with certain types of cancer who are receiving chemotherapy that affects the bone marrow, and to increase  survival after being exposed to high doses of radiation. This medicine may be used for other purposes; ask your health care provider or pharmacist if you have questions. COMMON BRAND NAME(S): Neulasta What should I tell my health care provider before I take this medicine? They need to know if you have any of these conditions: -kidney disease -latex allergy -ongoing radiation therapy -sickle cell disease -skin reactions to acrylic adhesives (On-Body Injector only) -an unusual or allergic reaction to pegfilgrastim, filgrastim, other medicines, foods, dyes, or preservatives -pregnant or trying to get pregnant -breast-feeding How should I use this medicine? This medicine is for injection under the skin. If you get this medicine at home, you will be taught how to prepare and give the pre-filled syringe or how to use the On-body Injector. Refer to the patient Instructions for Use for detailed instructions. Use exactly as directed. Tell your healthcare provider immediately if you suspect that the On-body Injector may not have performed as intended or if you suspect the use of the On-body Injector resulted in a missed or partial dose. It is important that you put your used needles and syringes in a special sharps container. Do not put them in a trash can. If you do not have a sharps container, call your pharmacist or healthcare provider to get one. Talk to your pediatrician regarding the use of this medicine in children. While this drug may be prescribed for selected conditions, precautions do apply. Overdosage: If you think you have taken too much of this medicine contact a poison control center or emergency room at once. NOTE: This medicine is only for you. Do not share this medicine with others. What if I miss a dose? It is important not to miss your dose. Call your doctor or health care professional if you miss your dose. If you miss a dose due to an On-body Injector failure or leakage, a new dose  should be administered as soon as possible using a single prefilled syringe for manual use. What may interact with this medicine? Interactions have not been studied. Give your health care provider a list of all the medicines, herbs, non-prescription drugs, or dietary supplements you use. Also tell them if you smoke, drink alcohol, or use illegal drugs. Some items may interact with your medicine. This list may not describe all possible interactions. Give your health care provider a list of all the medicines, herbs, non-prescription drugs, or dietary supplements you use. Also tell them if you smoke, drink alcohol, or use illegal drugs. Some items may interact with your medicine. What should I watch for while using this medicine? You may need blood work done while you are taking this medicine. If you are going to need a MRI, CT scan, or other procedure, tell your doctor that you are using this medicine (On-Body Injector only). What side effects may I notice from receiving this medicine? Side effects that you should report to your doctor or health care professional as soon as possible: -allergic reactions like skin rash, itching or hives, swelling of the face, lips, or tongue -dizziness -fever -pain, redness, or irritation at site where injected -pinpoint red spots on the skin -red or dark-brown urine -shortness of breath or breathing problems -stomach or side pain, or pain at the shoulder -swelling -tiredness -trouble passing urine or change  in the amount of urine Side effects that usually do not require medical attention (report to your doctor or health care professional if they continue or are bothersome): -bone pain -muscle pain This list may not describe all possible side effects. Call your doctor for medical advice about side effects. You may report side effects to FDA at 1-800-FDA-1088. Where should I keep my medicine? Keep out of the reach of children. Store pre-filled syringes in a  refrigerator between 2 and 8 degrees C (36 and 46 degrees F). Do not freeze. Keep in carton to protect from light. Throw away this medicine if it is left out of the refrigerator for more than 48 hours. Throw away any unused medicine after the expiration date. NOTE: This sheet is a summary. It may not cover all possible information. If you have questions about this medicine, talk to your doctor, pharmacist, or health care provider.  2018 Elsevier/Gold Standard (2015-12-19 12:58:03)

## 2016-06-21 ENCOUNTER — Encounter (HOSPITAL_COMMUNITY): Payer: Self-pay | Admitting: Family Medicine

## 2016-06-21 ENCOUNTER — Inpatient Hospital Stay (HOSPITAL_COMMUNITY)
Admission: EM | Admit: 2016-06-21 | Discharge: 2016-07-05 | DRG: 811 | Disposition: E | Payer: Medicaid Other | Attending: Family Medicine | Admitting: Family Medicine

## 2016-06-21 DIAGNOSIS — N179 Acute kidney failure, unspecified: Secondary | ICD-10-CM

## 2016-06-21 DIAGNOSIS — I5032 Chronic diastolic (congestive) heart failure: Secondary | ICD-10-CM | POA: Diagnosis present

## 2016-06-21 DIAGNOSIS — E861 Hypovolemia: Secondary | ICD-10-CM | POA: Diagnosis present

## 2016-06-21 DIAGNOSIS — K921 Melena: Secondary | ICD-10-CM | POA: Diagnosis present

## 2016-06-21 DIAGNOSIS — K922 Gastrointestinal hemorrhage, unspecified: Secondary | ICD-10-CM

## 2016-06-21 DIAGNOSIS — Z66 Do not resuscitate: Secondary | ICD-10-CM | POA: Diagnosis present

## 2016-06-21 DIAGNOSIS — D689 Coagulation defect, unspecified: Secondary | ICD-10-CM | POA: Diagnosis not present

## 2016-06-21 DIAGNOSIS — Z803 Family history of malignant neoplasm of breast: Secondary | ICD-10-CM

## 2016-06-21 DIAGNOSIS — R180 Malignant ascites: Secondary | ICD-10-CM | POA: Diagnosis present

## 2016-06-21 DIAGNOSIS — R578 Other shock: Secondary | ICD-10-CM | POA: Diagnosis present

## 2016-06-21 DIAGNOSIS — D649 Anemia, unspecified: Secondary | ICD-10-CM | POA: Diagnosis present

## 2016-06-21 DIAGNOSIS — Z5329 Procedure and treatment not carried out because of patient's decision for other reasons: Secondary | ICD-10-CM | POA: Diagnosis not present

## 2016-06-21 DIAGNOSIS — R188 Other ascites: Secondary | ICD-10-CM | POA: Diagnosis present

## 2016-06-21 DIAGNOSIS — D61818 Other pancytopenia: Secondary | ICD-10-CM | POA: Diagnosis not present

## 2016-06-21 DIAGNOSIS — T451X5A Adverse effect of antineoplastic and immunosuppressive drugs, initial encounter: Secondary | ICD-10-CM | POA: Diagnosis present

## 2016-06-21 DIAGNOSIS — I959 Hypotension, unspecified: Secondary | ICD-10-CM

## 2016-06-21 DIAGNOSIS — E871 Hypo-osmolality and hyponatremia: Secondary | ICD-10-CM

## 2016-06-21 DIAGNOSIS — Z6825 Body mass index (BMI) 25.0-25.9, adult: Secondary | ICD-10-CM

## 2016-06-21 DIAGNOSIS — D6481 Anemia due to antineoplastic chemotherapy: Principal | ICD-10-CM | POA: Diagnosis present

## 2016-06-21 DIAGNOSIS — C786 Secondary malignant neoplasm of retroperitoneum and peritoneum: Secondary | ICD-10-CM

## 2016-06-21 DIAGNOSIS — Z7189 Other specified counseling: Secondary | ICD-10-CM

## 2016-06-21 DIAGNOSIS — D638 Anemia in other chronic diseases classified elsewhere: Secondary | ICD-10-CM | POA: Diagnosis present

## 2016-06-21 DIAGNOSIS — Z7982 Long term (current) use of aspirin: Secondary | ICD-10-CM | POA: Diagnosis not present

## 2016-06-21 DIAGNOSIS — D61810 Antineoplastic chemotherapy induced pancytopenia: Secondary | ICD-10-CM | POA: Diagnosis present

## 2016-06-21 DIAGNOSIS — D509 Iron deficiency anemia, unspecified: Secondary | ICD-10-CM | POA: Diagnosis present

## 2016-06-21 DIAGNOSIS — Z515 Encounter for palliative care: Secondary | ICD-10-CM | POA: Diagnosis not present

## 2016-06-21 DIAGNOSIS — E43 Unspecified severe protein-calorie malnutrition: Secondary | ICD-10-CM | POA: Diagnosis present

## 2016-06-21 DIAGNOSIS — Z8249 Family history of ischemic heart disease and other diseases of the circulatory system: Secondary | ICD-10-CM | POA: Diagnosis not present

## 2016-06-21 DIAGNOSIS — E875 Hyperkalemia: Secondary | ICD-10-CM | POA: Diagnosis present

## 2016-06-21 DIAGNOSIS — C569 Malignant neoplasm of unspecified ovary: Secondary | ICD-10-CM | POA: Diagnosis not present

## 2016-06-21 DIAGNOSIS — N19 Unspecified kidney failure: Secondary | ICD-10-CM

## 2016-06-21 DIAGNOSIS — Z7952 Long term (current) use of systemic steroids: Secondary | ICD-10-CM | POA: Diagnosis not present

## 2016-06-21 DIAGNOSIS — C561 Malignant neoplasm of right ovary: Secondary | ICD-10-CM | POA: Diagnosis not present

## 2016-06-21 LAB — PREPARE RBC (CROSSMATCH)

## 2016-06-21 LAB — CBC WITH DIFFERENTIAL/PLATELET
BASOS PCT: 0 %
Basophils Absolute: 0 10*3/uL (ref 0.0–0.1)
EOS ABS: 0 10*3/uL (ref 0.0–0.7)
Eosinophils Relative: 0 %
HCT: 15.1 % — ABNORMAL LOW (ref 36.0–46.0)
Hemoglobin: 5 g/dL — CL (ref 12.0–15.0)
LYMPHS ABS: 0.3 10*3/uL — AB (ref 0.7–4.0)
Lymphocytes Relative: 19 %
MCH: 25.6 pg — ABNORMAL LOW (ref 26.0–34.0)
MCHC: 33.1 g/dL (ref 30.0–36.0)
MCV: 77.4 fL — AB (ref 78.0–100.0)
Monocytes Absolute: 0 10*3/uL — ABNORMAL LOW (ref 0.1–1.0)
Monocytes Relative: 1 %
NEUTROS PCT: 80 %
Neutro Abs: 1.3 10*3/uL — ABNORMAL LOW (ref 1.7–7.7)
PLATELETS: 19 10*3/uL — AB (ref 150–400)
RBC: 1.95 MIL/uL — ABNORMAL LOW (ref 3.87–5.11)
RDW: 19 % — ABNORMAL HIGH (ref 11.5–15.5)
WBC: 1.6 10*3/uL — AB (ref 4.0–10.5)

## 2016-06-21 LAB — PROTIME-INR
INR: 1.28
Prothrombin Time: 16.1 seconds — ABNORMAL HIGH (ref 11.4–15.2)

## 2016-06-21 LAB — COMPREHENSIVE METABOLIC PANEL
ALT: 10 U/L — ABNORMAL LOW (ref 14–54)
ANION GAP: 19 — AB (ref 5–15)
AST: 17 U/L (ref 15–41)
Albumin: 1.9 g/dL — ABNORMAL LOW (ref 3.5–5.0)
Alkaline Phosphatase: 62 U/L (ref 38–126)
BILIRUBIN TOTAL: 1.6 mg/dL — AB (ref 0.3–1.2)
BUN: 126 mg/dL — AB (ref 6–20)
CHLORIDE: 85 mmol/L — AB (ref 101–111)
CO2: 14 mmol/L — ABNORMAL LOW (ref 22–32)
Calcium: 7.7 mg/dL — ABNORMAL LOW (ref 8.9–10.3)
Creatinine, Ser: 3.16 mg/dL — ABNORMAL HIGH (ref 0.44–1.00)
GFR, EST AFRICAN AMERICAN: 17 mL/min — AB (ref >60)
GFR, EST NON AFRICAN AMERICAN: 15 mL/min — AB (ref >60)
Glucose, Bld: 95 mg/dL (ref 65–99)
POTASSIUM: 6.5 mmol/L — AB (ref 3.5–5.1)
Sodium: 118 mmol/L — CL (ref 135–145)
TOTAL PROTEIN: 5.3 g/dL — AB (ref 6.5–8.1)

## 2016-06-21 LAB — ABO/RH: ABO/RH(D): O NEG

## 2016-06-21 LAB — POC OCCULT BLOOD, ED: Fecal Occult Bld: POSITIVE — AB

## 2016-06-21 MED ORDER — ALBUMIN HUMAN 25 % IV SOLN
50.0000 g | INTRAVENOUS | Status: AC
  Administered 2016-06-21: 50 g via INTRAVENOUS
  Filled 2016-06-21: qty 200

## 2016-06-21 MED ORDER — PROCHLORPERAZINE MALEATE 10 MG PO TABS
10.0000 mg | ORAL_TABLET | Freq: Four times a day (QID) | ORAL | Status: DC | PRN
Start: 2016-06-21 — End: 2016-06-23

## 2016-06-21 MED ORDER — SODIUM BICARBONATE 8.4 % IV SOLN
50.0000 meq | Freq: Once | INTRAVENOUS | Status: AC
  Administered 2016-06-21: 50 meq via INTRAVENOUS
  Filled 2016-06-21: qty 50

## 2016-06-21 MED ORDER — DEXTROSE 5 % IV SOLN
1.0000 mg | Freq: Once | INTRAVENOUS | Status: AC
  Administered 2016-06-21: 1 mg via INTRAVENOUS
  Filled 2016-06-21: qty 0.1

## 2016-06-21 MED ORDER — VITAMIN B-12 1000 MCG PO TABS
1000.0000 ug | ORAL_TABLET | Freq: Every day | ORAL | Status: DC
  Filled 2016-06-21: qty 1

## 2016-06-21 MED ORDER — SODIUM CHLORIDE 0.9% FLUSH
3.0000 mL | Freq: Two times a day (BID) | INTRAVENOUS | Status: DC
  Administered 2016-06-22 (×2): 3 mL via INTRAVENOUS

## 2016-06-21 MED ORDER — ACETAMINOPHEN 325 MG PO TABS: 650.0000 mg | ORAL_TABLET | Freq: Four times a day (QID) | ORAL | Status: DC | PRN

## 2016-06-21 MED ORDER — ONDANSETRON HCL 4 MG PO TABS: 4.0000 mg | ORAL_TABLET | Freq: Four times a day (QID) | ORAL | Status: DC | PRN

## 2016-06-21 MED ORDER — INSULIN ASPART 100 UNIT/ML IV SOLN
10.0000 [IU] | Freq: Once | INTRAVENOUS | Status: AC
  Administered 2016-06-21: 10 [IU] via INTRAVENOUS
  Filled 2016-06-21: qty 0.1

## 2016-06-21 MED ORDER — BOOST / RESOURCE BREEZE PO LIQD: 1.0000 | Freq: Three times a day (TID) | ORAL | Status: DC

## 2016-06-21 MED ORDER — PANTOPRAZOLE SODIUM 40 MG IV SOLR: 40.0000 mg | Freq: Two times a day (BID) | INTRAVENOUS | Status: DC

## 2016-06-21 MED ORDER — SODIUM CHLORIDE 0.9 % IV SOLN
10.0000 mL/h | Freq: Once | INTRAVENOUS | Status: DC
Start: 2016-06-21 — End: 2016-06-21

## 2016-06-21 MED ORDER — SODIUM CHLORIDE 0.9 % IV SOLN
80.0000 mg | Freq: Once | INTRAVENOUS | Status: AC
  Administered 2016-06-21: 23:00:00 80 mg via INTRAVENOUS
  Filled 2016-06-21: qty 80

## 2016-06-21 MED ORDER — SODIUM CHLORIDE 0.9 % IV SOLN
Freq: Once | INTRAVENOUS | Status: AC
  Administered 2016-06-21: 23:00:00 via INTRAVENOUS

## 2016-06-21 MED ORDER — DEXTROSE 50 % IV SOLN
1.0000 | Freq: Once | INTRAVENOUS | Status: AC
  Administered 2016-06-21: 50 mL via INTRAVENOUS
  Filled 2016-06-21: qty 50

## 2016-06-21 MED ORDER — ACETAMINOPHEN 650 MG RE SUPP
650.0000 mg | Freq: Four times a day (QID) | RECTAL | Status: DC | PRN
Start: 2016-06-21 — End: 2016-06-23

## 2016-06-21 MED ORDER — SODIUM CHLORIDE 0.9 % IV SOLN: 10.0000 mL/h | Freq: Once | INTRAVENOUS | Status: DC

## 2016-06-21 MED ORDER — LORAZEPAM 2 MG/ML IJ SOLN
0.5000 mg | Freq: Four times a day (QID) | INTRAMUSCULAR | Status: DC | PRN
  Administered 2016-06-22 (×2): 0.5 mg via INTRAVENOUS
  Filled 2016-06-21 (×2): qty 1

## 2016-06-21 MED ORDER — SODIUM CHLORIDE 0.9 % IV BOLUS (SEPSIS)
1000.0000 mL | Freq: Once | INTRAVENOUS | Status: AC
  Administered 2016-06-21: 1000 mL via INTRAVENOUS

## 2016-06-21 MED ORDER — SODIUM CHLORIDE 0.9 % IV SOLN
8.0000 mg/h | INTRAVENOUS | Status: DC
  Administered 2016-06-22 (×2): 8 mg/h via INTRAVENOUS
  Filled 2016-06-21 (×5): qty 80

## 2016-06-21 MED ORDER — FUROSEMIDE 10 MG/ML IJ SOLN: 40.0000 mg | Freq: Once | INTRAMUSCULAR | Status: DC

## 2016-06-21 MED ORDER — MORPHINE SULFATE (PF) 4 MG/ML IV SOLN: 1.0000 mg | INTRAVENOUS | Status: DC | PRN

## 2016-06-21 MED ORDER — ONDANSETRON HCL 4 MG/2ML IJ SOLN: 4.0000 mg | Freq: Four times a day (QID) | INTRAMUSCULAR | Status: DC | PRN

## 2016-06-21 NOTE — ED Provider Notes (Signed)
[facility] DEPT Provider Note   CSN: [id number] Arrival date & time: 06/12/2016  2027     History   Chief Complaint Chief Complaint  Patient presents with  . Abnormal Lab  . Anemia    HPI Allison Haynes is a 61 y.o. female history of ovarian cancer with malignant ascites currently on chemotherapy (last chemo about a week ago), iron deficiency anemia with iron infusions here presenting with weakness, anemia. Patient states that about week ago she had chemotherapy. She currently resides at a nursing home. She has been feeling progressively weaker and more tired. She apparently had a CBC at the nursing home that showed hemoglobin 4.6 and she was sent for transfusion. She denies vomiting blood but she states that she has been having dark stools for the last week and she thought its from the iron pills.   The history is provided by the patient and the EMS personnel.    Past Medical History:  Diagnosis Date  . Ovarian cancer ([place]) 04/25/2016    Patient Active Problem List   Diagnosis Date Noted  . B12 deficiency 05/19/2016  . Iron deficiency anemia 05/05/2016  . Omental metastasis ([place])   . Malnutrition of moderate degree 04/27/2016  . Severe malnutrition ([place])   . Ovarian cancer ([place]) 04/25/2016  . Ascites 04/25/2016  . Microcytic anemia 04/25/2016  . Thrombocytosis ([place]) 04/25/2016  . Hyponatremia 04/25/2016    Past Surgical History:  Procedure Laterality Date  . IR PARACENTESIS  04/28/2016    OB History    No data available       Home Medications    Prior to Admission medications   Medication Sig Start Date End Date Taking? Authorizing Provider  aspirin EC 81 MG tablet Take 1 tablet (81 mg total) by mouth 2 (two) times daily. 05/06/16   Thurnell Lose, MD  ceFAZolin (ANCEF) 2-4 GM/100ML-% IVPB Inject 100 mLs (2 g total) into the vein every 8 (eight) hours. Stop date 05-20-16 05/06/16   Thurnell Lose, MD  dexamethasone (DECADRON) 4 MG tablet Take 5 tablets at 6 am by  mouth on the days of chemo only,e very 3 weeks, starting 05/20/16 05/20/16   Heath Lark, MD  feeding supplement (BOOST / RESOURCE BREEZE) LIQD Take 1 Container by mouth 3 (three) times daily between meals. 05/06/16   Thurnell Lose, MD  ferrous sulfate 325 (65 FE) MG tablet Take 1 tablet (325 mg total) by mouth 2 (two) times daily with a meal. 05/06/16   Thurnell Lose, MD  furosemide (LASIX) 40 MG tablet Take 1 tablet (40 mg total) by mouth 2 (two) times daily. 05/06/16   Thurnell Lose, MD  lidocaine-prilocaine (EMLA) cream Apply to affected area once 05/19/16   Heath Lark, MD  metoprolol tartrate (LOPRESSOR) 25 MG tablet Take 1 tablet (25 mg total) by mouth 2 (two) times daily. 05/06/16   Thurnell Lose, MD  ondansetron (ZOFRAN) 8 MG tablet Take 1 tablet (8 mg total) by mouth 2 (two) times daily as needed for refractory nausea / vomiting. Start on day 3 after chemo. 05/20/16   Heath Lark, MD  oxyCODONE (OXY IR/ROXICODONE) 5 MG immediate release tablet Take 1 tablet (5 mg total) by mouth every 6 (six) hours as needed for moderate pain. 05/06/16   Thurnell Lose, MD  pantoprazole (PROTONIX) 40 MG tablet Take 1 tablet (40 mg total) by mouth daily. 05/06/16   Thurnell Lose, MD  polyethylene glycol [facility] Floria Raveling) packet Take  17 g by mouth daily. 05/06/16   Thurnell Lose, MD  potassium chloride SA (K-DUR,KLOR-CON) 20 MEQ tablet Take 1 tablet (20 mEq total) by mouth daily. 05/06/16   Thurnell Lose, MD  prochlorperazine (COMPAZINE) 10 MG tablet Take 1 tablet (10 mg total) by mouth every 6 (six) hours as needed (Nausea or vomiting). 05/20/16   Heath Lark, MD  spironolactone (ALDACTONE) 25 MG tablet Take 1 tablet (25 mg total) by mouth daily. Patient not taking: Reported on 05/19/2016 05/06/16   Thurnell Lose, MD  vitamin B-12 1000 MCG tablet Take 1 tablet (1,000 mcg total) by mouth daily. 05/06/16   Thurnell Lose, MD    Family History Family History  Problem Relation Age of Onset  .  Diabetes Mellitus II Mother   . Hypertension Mother   . Diabetes Mellitus II Father   . Hypertension Father   . Breast cancer Maternal Aunt   . Breast cancer Paternal Aunt     Social History Social History  Substance Use Topics  . Smoking status: Never Smoker  . Smokeless tobacco: Never Used  . Alcohol use No     Allergies   Pseudoephedrine   Review of Systems Review of Systems  Neurological: Positive for weakness.  All other systems reviewed and are negative.    Physical Exam Updated Vital Signs BP (!) 74/40 (BP Location: Left Arm)   Pulse 83   Temp 97.6 F (36.4 C) (Oral)   Resp (!) 23   Ht 5\' 5"  (1.651 m)   Wt 70.3 kg (155 lb)   SpO2 99%   BMI 25.79 kg/m   Physical Exam  Constitutional:  Pale, ill appearing   HENT:  Head: Normocephalic.  MM dry   Eyes: Conjunctivae and EOM are normal. Pupils are equal, round, and reactive to light.  Neck: Normal range of motion. Neck supple.  Cardiovascular: Normal rate, regular rhythm and normal heart sounds.   Pulmonary/Chest: Effort normal and breath sounds normal. No respiratory distress.  Abdominal: She exhibits no distension.  Distended with fluid wave, mild diffuse tenderness, no rebound   Musculoskeletal: Normal range of motion.  Neurological: She is alert.  Skin: Skin is warm.  Psychiatric: She has a normal mood and affect.  Nursing note and vitals reviewed.    ED Treatments / Results  Labs (all labs ordered are listed, but only abnormal results are displayed) Labs Reviewed  CBC WITH DIFFERENTIAL/PLATELET - Abnormal; Notable for the following:       Result Value   WBC 1.6 (*)    RBC 1.95 (*)    Hemoglobin 5.0 (*)    HCT 15.1 (*)    MCV 77.4 (*)    MCH 25.6 (*)    RDW 19.0 (*)    Platelets 19 (*)    Neutro Abs 1.3 (*)    Lymphs Abs 0.3 (*)    Monocytes Absolute 0.0 (*)    All other components within normal limits  COMPREHENSIVE METABOLIC PANEL - Abnormal; Notable for the following:    Sodium  118 (*)    Potassium 6.5 (*)    Chloride 85 (*)    CO2 14 (*)    Creatinine, Ser 3.16 (*)    Calcium 7.7 (*)    Total Protein 5.3 (*)    Albumin 1.9 (*)    ALT 10 (*)    Total Bilirubin 1.6 (*)    GFR calc non Af Amer 15 (*)    GFR calc Af Wyvonnia Lora  17 (*)    Anion gap 19 (*)    All other components within normal limits  PROTIME-INR - Abnormal; Notable for the following:    Prothrombin Time 16.1 (*)    All other components within normal limits  POC OCCULT BLOOD, ED - Abnormal; Notable for the following:    Fecal Occult Bld POSITIVE (*)    All other components within normal limits  I-STAT CHEM 8, ED  TYPE AND SCREEN  PREPARE RBC (CROSSMATCH)  ABO/RH    EKG  EKG Interpretation None       Radiology No results found.  Procedures Procedures (including critical care time)  CRITICAL CARE Performed by: Wandra Arthurs   Total critical care time: 30 minutes  Critical care time was exclusive of separately billable procedures and treating other patients.  Critical care was necessary to treat or prevent imminent or life-threatening deterioration.  Critical care was time spent personally by me on the following activities: development of treatment plan with patient and/or surrogate as well as nursing, discussions with consultants, evaluation of patient's response to treatment, examination of patient, obtaining history from patient or surrogate, ordering and performing treatments and interventions, ordering and review of laboratory studies, ordering and review of radiographic studies, pulse oximetry and re-evaluation of patient's condition.   Medications Ordered in ED Medications  0.9 %  sodium chloride infusion (not administered)  pantoprazole (PROTONIX) 80 mg in sodium chloride 0.9 % 100 mL IVPB (not administered)  pantoprazole (PROTONIX) 80 mg in sodium chloride 0.9 % 250 mL (0.32 mg/mL) infusion (not administered)  pantoprazole (PROTONIX) injection 40 mg (not administered)    sodium chloride 0.9 % bolus 1,000 mL (1,000 mLs Intravenous New Bag/Given 06/13/2016 2151)     Initial Impression / Assessment and Plan / ED Course  I have reviewed the triage vital signs and the nursing notes.  Pertinent labs & imaging results that were available during my care of the patient were reviewed by me and considered in my medical decision making (see chart for details).     Allison Haynes is a 61 y.o. female here with anemia. Patient also hypotensive 80/40 per EMS. Will order IVF, CBC, CMP, type and cross and transfuse 3 U PRBC. Will get occ blood.   10:03 PM Occ positive. Hg confirmed to be 5.0. BP still 70s despite IVF. Ordered 3 U PRBC. Started on protonix drip. I consulted GI (Dr. Alessandra Bevels) to see patient. He agrees with protonix drip, IVF and transfusion. Will admit to stepdown.   10:03 PM Hg 5.0. Na 118. Cr 3.2. K 6.5. I am concerned that patient may have end stage ovarian cancer. Patient's BP in the 60s. She states that she wants to be DNR and no extra ordinary measures. Will admit to stepdown.  Hospitalist request transfusion of 1 U platelets as well.   Final Clinical Impressions(s) / ED Diagnoses   Final diagnoses:  None    New Prescriptions New Prescriptions   No medications on file     Drenda Freeze, MD 06/14/2016 2220

## 2016-06-21 NOTE — Progress Notes (Signed)
---   NO CHARGE NOTE--  - Received call from Dr. Darl Householder for patient with dark stools and IDA.  - chart reviewed. 61 yr old patient with Metastatic Ovarian cancer currently on Chemo and has known IDA. Patient was sent to ER from NH for Blood transfusion for anemia. Patient currently hypotensive. Patient has pancytopenia with Hgb of 5 and PLT of 19. Not a candidate for endoscopic intervention at this time.  - D/W Dr. Darl Householder. Transfuse to keep Hgb > 8. PPI drip. Npo for now. Patient may benefit from CT at some point.  - [facility] will do full consult tomorrow.     Otis Brace MD, [place] 06/12/2016, 10:29 PM  Pager [phone]  If no answer or after 5 PM call [phone]

## 2016-06-21 NOTE — ED Triage Notes (Signed)
Pt comes from [facility]. Pt had abnormal labs of hemoglobin and low platlet count. Pt is weak and tired. Skin cold and not dry. V/s on arrival 90/40, hr 88, spo2 100 2 liters, rr 20.  Hx of blood transfusions.  Pt has a PICC. 200 ns in by ems.  Hernia noted.skin clots noted.

## 2016-06-21 NOTE — ED Notes (Signed)
Bed: WA08 Expected date:  Expected time:  Means of arrival:  Comments: 61 yo F/ Abnormal Labs

## 2016-06-21 NOTE — H&P (Signed)
History and Physical    Allison Haynes DPO:[id number] DOB: 01/19/1955 DOA: 06/27/2016  PCP: Seward Carol, MD   Patient coming from: Nursing home  Chief Complaint: Black stools, fatigue, pallor, Hgb 4.6 and platelets low  HPI: Allison Haynes is a quite pleasant but very unfortunate 61 y.o. female with medical history significant for ovarian cancer with omental metastasis and malignant ascites, now presenting to the emergency department from her nursing facility for evaluation of fatigue, weakness, melena, and critically low hemoglobin and outpatient blood work. Patient has been under the care of oncology and receiving chemotherapy, last infusion on 06/10/2016. She reports that shortly after her last infusion, she developed weakness and nonspecific malaise that was initially mild, but has progressed significantly, particularly over the past couple days. She reports chronic ongoing pain across the lower abdomen, but denies any significant cough or dyspnea. She reports that her stools have been black for at least one week, but she had been attributing this to her iron supplementation. She notes mild nausea, but denies upper abdominal pain or vomiting. She was evaluated for her progressive fatigue with a CBC that returned notable for hemoglobin of 4.7 and low platelets. She was brought to the ED for further evaluation and management of this.  ED Course: Upon arrival to the ED, patient is found to be afebrile, saturating adequately on 2 L/m supplemental oxygen, slightly tachypneic, and with blood pressure 74/40. EKG features a sinus rhythm with mild peaking of the T waves. Chemistry panels notable for sodium 118, potassium 6.5, bicarbonate 14, BUN 126, serum creatinine 3.16, albumin 1.9, and total bilirubin of 1.6. CBC features a pancytopenia with WBC 1.6, hemoglobin 5.0, platelets 19,000, and MCV of 77.4. ANC is 1300. INR is 1.28. Patient was treated with 1 L of normal saline, given 80 mg IV push of Protonix and  started on Protonix infusion, and 3 units of packed red blood cells were ordered for immediate transfusion. Gastroenterology was consulted by the ED physician, but advised that the patient was too unstable for any emergent intervention, but would be seen in consultation in the morning. ED provider discuss CODE STATUS with the patient and she reported that she wanted to be transfused blood, but had already determined that her wishes in this situation would be for no heroic measures.  This was clarified at time of admission and the patient specifically states that she does not want invasive ventilation or ACLS medications. She expresses understanding that she is acutely and critically ill and may very well not survive this admission. She will be admitted to the stepdown unit for ongoing evaluation and management of suspected upper GI bleed with hemorrhagic shock with end-organ failure.  Review of Systems:  All other systems reviewed and apart from HPI, are negative.  Past Medical History:  Diagnosis Date  . Ovarian cancer ([place]) 04/25/2016    Past Surgical History:  Procedure Laterality Date  . IR PARACENTESIS  04/28/2016     reports that she has never smoked. She has never used smokeless tobacco. She reports that she does not drink alcohol or use drugs.  Allergies  Allergen Reactions  . Pseudoephedrine Other (See Comments)    "makes me loopy"    Family History  Problem Relation Age of Onset  . Diabetes Mellitus II Mother   . Hypertension Mother   . Diabetes Mellitus II Father   . Hypertension Father   . Breast cancer Maternal Aunt   . Breast cancer Paternal Aunt  Prior to Admission medications   Medication Sig Start Date End Date Taking? Authorizing Provider  aspirin EC 81 MG tablet Take 1 tablet (81 mg total) by mouth 2 (two) times daily. 05/06/16   Thurnell Lose, MD  dexamethasone (DECADRON) 4 MG tablet Take 5 tablets at 6 am by mouth on the days of chemo only,e very 3 weeks,  starting 05/20/16 05/20/16   Heath Lark, MD  feeding supplement (BOOST / RESOURCE BREEZE) LIQD Take 1 Container by mouth 3 (three) times daily between meals. 05/06/16   Thurnell Lose, MD  ferrous sulfate 325 (65 FE) MG tablet Take 1 tablet (325 mg total) by mouth 2 (two) times daily with a meal. 05/06/16   Thurnell Lose, MD  furosemide (LASIX) 40 MG tablet Take 1 tablet (40 mg total) by mouth 2 (two) times daily. 05/06/16   Thurnell Lose, MD  lidocaine-prilocaine (EMLA) cream Apply to affected area once 05/19/16   Heath Lark, MD  metoprolol tartrate (LOPRESSOR) 25 MG tablet Take 1 tablet (25 mg total) by mouth 2 (two) times daily. 05/06/16   Thurnell Lose, MD  ondansetron (ZOFRAN) 8 MG tablet Take 1 tablet (8 mg total) by mouth 2 (two) times daily as needed for refractory nausea / vomiting. Start on day 3 after chemo. 05/20/16   Heath Lark, MD  oxyCODONE (OXY IR/ROXICODONE) 5 MG immediate release tablet Take 1 tablet (5 mg total) by mouth every 6 (six) hours as needed for moderate pain. 05/06/16   Thurnell Lose, MD  pantoprazole (PROTONIX) 40 MG tablet Take 1 tablet (40 mg total) by mouth daily. 05/06/16   Thurnell Lose, MD  polyethylene glycol (MIRALAX / GLYCOLAX) packet Take 17 g by mouth daily. 05/06/16   Thurnell Lose, MD  potassium chloride SA (K-DUR,KLOR-CON) 20 MEQ tablet Take 1 tablet (20 mEq total) by mouth daily. 05/06/16   Thurnell Lose, MD  prochlorperazine (COMPAZINE) 10 MG tablet Take 1 tablet (10 mg total) by mouth every 6 (six) hours as needed (Nausea or vomiting). 05/20/16   Heath Lark, MD  spironolactone (ALDACTONE) 25 MG tablet Take 1 tablet (25 mg total) by mouth daily. Patient not taking: Reported on 05/19/2016 05/06/16   Thurnell Lose, MD  vitamin B-12 1000 MCG tablet Take 1 tablet (1,000 mcg total) by mouth daily. 05/06/16   Thurnell Lose, MD    Physical Exam: Vitals:   06/13/2016 2041 06/18/2016 2043 06/14/2016 2132  BP:  (!) 74/40   Pulse:  83   Resp:  (!)  23   Temp:  97.6 F (36.4 C)   TempSrc:  Oral   SpO2: 100% 99%   Weight:   70.3 kg (155 lb)  Height:   5\' 5"  (1.651 m)      Constitutional: Not in respiratory distress. Pale, appears malnourished.  Eyes: PERTLA, lids and conjunctivae normal ENMT: Mucous membranes are dry. Posterior pharynx clear of any exudate or lesions.   Neck: normal, supple, no masses, no thyromegaly Respiratory: clear to auscultation bilaterally, no wheezing, no crackles. Mild tachypnea. No accessory muscle use.  Cardiovascular: S1 & S2 heard, regular rate and rhythm. No significant JVD. Abdomen: Distended, firm mass in lower abdomen but o/w soft. Tender throughout, no rebound pain or guarding. Bowel sounds appreciated.  Musculoskeletal: no clubbing / cyanosis. No joint deformity upper and lower extremities.  Skin: Ecchymosis involving LUE. Poor turgor. Pallor. Cool, dry.  Neurologic: No gross facial asymmetry. Sensation intact. Moving all extremities spontaneously.  Psychiatric: Alert and oriented x 3. Pleasant and cooperative.     Labs on Admission: I have personally reviewed following labs and imaging studies  CBC:  Recent Labs Lab 06/28/2016 2112  WBC 1.6*  NEUTROABS 1.3*  HGB 5.0*  HCT 15.1*  MCV 77.4*  PLT 19*   Basic Metabolic Panel:  Recent Labs Lab 06/14/2016 2112  NA 118*  K 6.5*  CL 85*  CO2 14*  GLUCOSE 95  BUN 126*  CREATININE 3.16*  CALCIUM 7.7*   GFR: Estimated Creatinine Clearance: 18.6 mL/min (A) (by C-G formula based on SCr of 3.16 mg/dL (H)). Liver Function Tests:  Recent Labs Lab 06/25/2016 2112  AST 17  ALT 10*  ALKPHOS 62  BILITOT 1.6*  PROT 5.3*  ALBUMIN 1.9*   No results for input(s): LIPASE, AMYLASE in the last 168 hours. No results for input(s): AMMONIA in the last 168 hours. Coagulation Profile:  Recent Labs Lab 06/25/2016 2112  INR 1.28   Cardiac Enzymes: No results for input(s): CKTOTAL, CKMB, CKMBINDEX, TROPONINI in the last 168 hours. BNP (last  3 results) No results for input(s): PROBNP in the last 8760 hours. HbA1C: No results for input(s): HGBA1C in the last 72 hours. CBG: No results for input(s): GLUCAP in the last 168 hours. Lipid Profile: No results for input(s): CHOL, HDL, LDLCALC, TRIG, CHOLHDL, LDLDIRECT in the last 72 hours. Thyroid Function Tests: No results for input(s): TSH, T4TOTAL, FREET4, T3FREE, THYROIDAB in the last 72 hours. Anemia Panel: No results for input(s): VITAMINB12, FOLATE, FERRITIN, TIBC, IRON, RETICCTPCT in the last 72 hours. Urine analysis:    Component Value Date/Time   COLORURINE AMBER (A) 04/26/2016 0530   APPEARANCEUR CLEAR 04/26/2016 0530   LABSPEC >1.046 (H) 04/26/2016 0530   PHURINE 5.0 04/26/2016 0530   GLUCOSEU NEGATIVE 04/26/2016 0530   HGBUR NEGATIVE 04/26/2016 0530   BILIRUBINUR NEGATIVE 04/26/2016 0530   KETONESUR NEGATIVE 04/26/2016 0530   PROTEINUR NEGATIVE 04/26/2016 0530   NITRITE NEGATIVE 04/26/2016 0530   LEUKOCYTESUR NEGATIVE 04/26/2016 0530   Sepsis Labs: @LABRCNTIP (procalcitonin:4,lacticidven:4) )No results found for this or any previous visit (from the past 240 hour(s)).   Radiological Exams on Admission: No results found.  EKG: Independently reviewed. Sinus rhythm, slight peaking of T-waves.  Assessment/Plan  1. Upper GIB with hemorrhagic shock  - Pt presents with 1 wk of melena and progressive fatigue, found to have Hgb of 5.0, platelets 19k, BUN 126, and FOBT is positive  - She is persistently hypotensive  - GI consulting and much appreciated; will follow-up on recommendations  - Pilar Plate discussion was had with patient as documented below; in accordance with patient's wishes, will avoid ACLS medications, CPR/defibrillation, and invasive ventilation  - She received NS bolus in ED and has 3 units pRBCs and platelets ordered for immediate transfusion  - She was given 80 mg IV Protonix and will be continued on Protonix infusion  - She has malignant ascites and  chronic CHF, already given saline bolus; albumin only 1.9, will give 50 g albumin STAT for hypotension while awaiting RBC's - INR elevated, given 1 mg IV vit K  - Check post-transfusion CBC    2. Acute renal failure with hyperkalemia  - BUN 126 with UGIB likely contributing; SCr is 3.16 on admission, up from 0.9 eleven days earlier; serum potassium is 6.5 with subtle peaking of T-waves  - Likely prerenal in setting of hemorrhagic shock; will consider CT abd/pelvis when stable - Given NS bolus in ED and albumin bolus on  admission; RBC and platelets to be transfused   - Hold diuretics, repeat serial chem panels    3. Pancytopenia - WBC is 1,600 on admission with ANC 1,300  - Hgb is 5.0 and platelets 19k  - There is apparent upper GIB and chemo likely contributing as well   - Culture and start abx if febrile; transfusing RBCs and platelets as above   4. Metastatic ovarian cancer, malignant ascites  - Under the care of oncology with last infusion on 06/10/16  - Abdomen distended, but not taut  - Diuretics on hold in light of hypotension   5. Hyponatremia  - Serum sodium 118 on admission in setting of marked hypovolemia  - Give normal saline bolus in ED  - Following serial chem panels    6. DNR discussion  - Pt reports that she does not want any extraordinary measures performed, and really just wants blood transfused  - We discussed that the blood products may get her blood pressure up enough to perfuse organs, but it may not; we also discussed that there is already end-organ failure d/t hemorrhagic shock - We discussed that she may not survive this hospitalization; she demonstrates a good understanding of this and seems to have good insight into the situation - She confirms that she does not want life support and specifically confirms that she does not want invasive ventilation, CPR, defibrillations, or ACLS medications     DVT prophylaxis: SCD's   Code Status: DNR  Family Communication:  Discussed with patient Disposition Plan: Admit to SDU Consults called: [facility] Admission status: Inpatient    Vianne Bulls, MD [facility] Pager [phone]  If 7PM-7AM, please contact night-coverage [email or web address] Password [facility]  06/30/2016, 10:42 PM

## 2016-06-22 DIAGNOSIS — I959 Hypotension, unspecified: Secondary | ICD-10-CM

## 2016-06-22 DIAGNOSIS — C561 Malignant neoplasm of right ovary: Secondary | ICD-10-CM

## 2016-06-22 DIAGNOSIS — N19 Unspecified kidney failure: Secondary | ICD-10-CM

## 2016-06-22 LAB — BASIC METABOLIC PANEL
Anion gap: 13 (ref 5–15)
BUN: 136 mg/dL — ABNORMAL HIGH (ref 6–20)
CALCIUM: 8.1 mg/dL — AB (ref 8.9–10.3)
CO2: 21 mmol/L — ABNORMAL LOW (ref 22–32)
CREATININE: 3.13 mg/dL — AB (ref 0.44–1.00)
Chloride: 91 mmol/L — ABNORMAL LOW (ref 101–111)
GFR calc Af Amer: 17 mL/min — ABNORMAL LOW (ref >60)
GFR, EST NON AFRICAN AMERICAN: 15 mL/min — AB (ref >60)
GLUCOSE: 114 mg/dL — AB (ref 65–99)
Potassium: 5.5 mmol/L — ABNORMAL HIGH (ref 3.5–5.1)
Sodium: 125 mmol/L — ABNORMAL LOW (ref 135–145)

## 2016-06-22 LAB — PROTIME-INR
INR: 1.4
PROTHROMBIN TIME: 17.3 s — AB (ref 11.4–15.2)

## 2016-06-22 LAB — COMPREHENSIVE METABOLIC PANEL
ALK PHOS: 57 U/L (ref 38–126)
ALT: 12 U/L — AB (ref 14–54)
ANION GAP: 14 (ref 5–15)
AST: 19 U/L (ref 15–41)
Albumin: 2.4 g/dL — ABNORMAL LOW (ref 3.5–5.0)
BUN: 135 mg/dL — ABNORMAL HIGH (ref 6–20)
CALCIUM: 8.1 mg/dL — AB (ref 8.9–10.3)
CO2: 21 mmol/L — ABNORMAL LOW (ref 22–32)
CREATININE: 2.97 mg/dL — AB (ref 0.44–1.00)
Chloride: 91 mmol/L — ABNORMAL LOW (ref 101–111)
GFR, EST AFRICAN AMERICAN: 19 mL/min — AB (ref >60)
GFR, EST NON AFRICAN AMERICAN: 16 mL/min — AB (ref >60)
Glucose, Bld: 101 mg/dL — ABNORMAL HIGH (ref 65–99)
Potassium: 5.1 mmol/L (ref 3.5–5.1)
Sodium: 126 mmol/L — ABNORMAL LOW (ref 135–145)
Total Bilirubin: 3.4 mg/dL — ABNORMAL HIGH (ref 0.3–1.2)
Total Protein: 5.3 g/dL — ABNORMAL LOW (ref 6.5–8.1)

## 2016-06-22 LAB — CBC
HEMATOCRIT: 20.6 % — AB (ref 36.0–46.0)
HEMOGLOBIN: 7.2 g/dL — AB (ref 12.0–15.0)
MCH: 26.8 pg (ref 26.0–34.0)
MCHC: 35 g/dL (ref 30.0–36.0)
MCV: 76.6 fL — AB (ref 78.0–100.0)
PLATELETS: 14 10*3/uL — AB (ref 150–400)
RBC: 2.69 MIL/uL — AB (ref 3.87–5.11)
RDW: 15.9 % — ABNORMAL HIGH (ref 11.5–15.5)
WBC: 1 10*3/uL — AB (ref 4.0–10.5)

## 2016-06-22 LAB — MRSA PCR SCREENING: MRSA by PCR: NEGATIVE

## 2016-06-22 MED ORDER — SODIUM CHLORIDE 0.9 % IV SOLN: INTRAVENOUS | Status: DC

## 2016-06-22 MED ORDER — SODIUM CHLORIDE 0.9 % IV BOLUS (SEPSIS)
1000.0000 mL | Freq: Once | INTRAVENOUS | Status: AC
  Administered 2016-06-22: 1000 mL via INTRAVENOUS

## 2016-06-22 MED ORDER — HYDROCORTISONE 2.5 % RE CREA
1.0000 "application " | TOPICAL_CREAM | Freq: Four times a day (QID) | RECTAL | Status: DC | PRN
  Administered 2016-06-22: 1 via TOPICAL
  Filled 2016-06-22: qty 28.35

## 2016-06-22 MED ORDER — SODIUM CHLORIDE 0.9 % IV BOLUS (SEPSIS)
250.0000 mL | Freq: Once | INTRAVENOUS | Status: AC
  Administered 2016-06-22: 250 mL via INTRAVENOUS

## 2016-06-22 MED ORDER — HYDROMORPHONE HCL 1 MG/ML IJ SOLN
0.5000 mg | Freq: Once | INTRAMUSCULAR | Status: AC
  Administered 2016-06-22: 0.5 mg via INTRAVENOUS
  Filled 2016-06-22: qty 0.5

## 2016-06-22 MED ORDER — SODIUM CHLORIDE 0.9 % IV SOLN
INTRAVENOUS | Status: DC
  Administered 2016-06-22: 18:00:00 via INTRAVENOUS

## 2016-06-22 MED ORDER — ORAL CARE MOUTH RINSE
15.0000 mL | Freq: Two times a day (BID) | OROMUCOSAL | Status: DC
  Administered 2016-06-22: 15 mL via OROMUCOSAL

## 2016-06-22 MED ORDER — MORPHINE SULFATE (PF) 2 MG/ML IV SOLN
1.0000 mg | INTRAVENOUS | Status: DC | PRN
  Administered 2016-06-22: 2 mg via INTRAVENOUS
  Administered 2016-06-22 (×3): 3 mg via INTRAVENOUS
  Administered 2016-06-22: 2 mg via INTRAVENOUS
  Administered 2016-06-22 (×3): 3 mg via INTRAVENOUS
  Administered 2016-06-22: 1 mg via INTRAVENOUS
  Filled 2016-06-22: qty 1
  Filled 2016-06-22: qty 2
  Filled 2016-06-22: qty 1
  Filled 2016-06-22 (×3): qty 2
  Filled 2016-06-22: qty 1
  Filled 2016-06-22 (×2): qty 2

## 2016-06-22 MED ORDER — CHLORHEXIDINE GLUCONATE 0.12 % MT SOLN
15.0000 mL | Freq: Two times a day (BID) | OROMUCOSAL | Status: DC
  Administered 2016-06-22 (×2): 15 mL via OROMUCOSAL
  Filled 2016-06-22: qty 15

## 2016-06-22 MED ORDER — LORAZEPAM 2 MG/ML IJ SOLN: 1.0000 mg | INTRAMUSCULAR | Status: DC | PRN

## 2016-06-22 NOTE — Progress Notes (Signed)
   06/22/16 1800  Clinical Encounter Type  Visited With Patient and family together;Health care provider  Visit Type Initial;Patient actively dying  Referral From Nurse  Consult/Referral To Faith community  Spiritual Encounters  Spiritual Needs Prayer;Emotional  Stress Factors  Patient Stress Factors None identified  Family Stress Factors Exhausted;Loss of control   Chaplain was called in order to provide emotional and spiritual support for a Pt facing End of life.   Pt was slightly agitated. RN was made aware and she addressed the issue with Medication. Mother of Pt and Aunt was at bedside.  There was not a church home or pastor; however, family reported to be "people of faith".    Chaplain listened empathically while family communicated their grief. Chaplain sat with family as they explored their fears, hopes and beliefs. Chaplain listened as the Mother of Pt lamented. Chaplain encouraged basic self-care activities i.e. (drinking water.)   Chaplain waited with the Pt's mother for other family members to arrive. During that time, Chaplain provided a silent and supportive presence.    Please page [phone] if the family needs more emotional or spiritual support.

## 2016-06-22 NOTE — Progress Notes (Addendum)
Called by RN that patient blood pressure dropped to 73/39, and has become more lethargic. Spoke to patient's daughter on phone, and discussed patient's worsening condition. Earlier GI was consulted but patient refused EGD.   Patient had discussed with admitting physician that she did not want heroic measures including mechanical ventilation, CPR, defibrillation or ACLS medications.  Discussed the futility of pressor support at this time, considering patient may have active bleeding going on, and without intervention any aggressive pressor support will not help the patients condition.  Patient is too sick  at this time to undergo intervention, with worsening BUN/creatinine 136/3.13, WBC has dropped to 1.0, platelets 14,000 and considering patient's wish not to undergo EGD when she was alert and awake, patient's daughter agrees with comfort measures only. We'll start aggressive IV hydration with normal saline at 125 ml/hr as per daughter's wish to give her some time to reach [place] as she is traveling from [place].  Exam- Patient is obtunded, not responding.  Assessment Pancytopenia Hemorrhagic shock Metastatic ovarian cancer Malignant ascites Hyponatremia Acute kidney injury  Plan Actively dying Start IV normal saline at 125 ML per hour as per daughter's wishes. Patient at this time as critically ill with hemorrhagic shock, end  organ failure. Anticipate hospital death Comfort measures  Critical care spent 35 minutes

## 2016-06-22 NOTE — NC FL2 (Signed)
[place] LEVEL OF CARE SCREENING TOOL     IDENTIFICATION  Patient Name: Allison Haynes Birthdate: 07-Jan-1955 Sex: female Admission Date (Current Location): 06/07/2016  [facility] and [place] Number:  Herbalist and Address:  [facility],  [place] [address] [place]      Provider Number: [id number]  Attending Physician Name and Address:  Oswald Hillock, MD  Relative Name and Phone Number:       Current Level of Care: Hospital Recommended Level of Care: [place] Prior Approval Number:    Date Approved/Denied:   PASRR Number: [id number]  Discharge Plan: SNF    Current Diagnoses: Patient Active Problem List   Diagnosis Date Noted  . Acute GI bleeding 06/22/2016  . Acute renal failure (ARF) ([place]) 06/25/2016  . Hyperkalemia 06/10/2016  . Pancytopenia ([place]) 07/01/2016  . Hemorrhagic shock ([place]) 06/28/2016  . DNR (do not resuscitate) discussion 06/17/2016  . Coagulopathy ([place]) 06/08/2016  . Upper GI bleed   . B12 deficiency 05/19/2016  . Iron deficiency anemia 05/05/2016  . Omental metastasis ([place])   . Malnutrition of moderate degree 04/27/2016  . Severe malnutrition ([place])   . Ovarian cancer ([place]) 04/25/2016  . Ascites 04/25/2016  . Microcytic anemia 04/25/2016  . Thrombocytosis ([place]) 04/25/2016  . Hyponatremia 04/25/2016    Orientation RESPIRATION BLADDER Height & Weight     Self, Time, Situation, Place  O2 (3L while in hospital) External catheter, Incontinent (while in hospital) Weight: 156 lb 8 oz (71 kg) Height:  5\' 5"  (165.1 cm)  BEHAVIORAL SYMPTOMS/MOOD NEUROLOGICAL BOWEL NUTRITION STATUS      Continent Diet (regular)  AMBULATORY STATUS COMMUNICATION OF NEEDS Skin   Limited Assist Verbally Normal                       Personal Care Assistance Level of Assistance  Bathing, Feeding, Dressing Bathing Assistance: Limited assistance Feeding assistance: Limited assistance Dressing Assistance: Limited  assistance     Functional Limitations Info  Sight, Hearing, Speech Sight Info: Adequate Hearing Info: Adequate Speech Info: Adequate    SPECIAL CARE FACTORS FREQUENCY  PT (By licensed PT), OT (By licensed OT)     PT Frequency: 3x OT Frequency: 3x            Contractures Contractures Info: Not present    Additional Factors Info  Code Status, Allergies Code Status Info: DNR Allergies Info: Pseudoephedrine           Current Medications (06/22/2016):  This is the current hospital active medication list Current Facility-Administered Medications  Medication Dose Route Frequency Provider Last Rate Last Dose  . acetaminophen (TYLENOL) tablet 650 mg  650 mg Oral Q6H PRN Opyd, Ilene Qua, MD       Or  . acetaminophen (TYLENOL) suppository 650 mg  650 mg Rectal Q6H PRN Opyd, Ilene Qua, MD      . chlorhexidine (PERIDEX) 0.12 % solution 15 mL  15 mL Mouth Rinse BID Opyd, Ilene Qua, MD   15 mL at 06/22/16 1100  . furosemide (LASIX) injection 40 mg  40 mg Intravenous Once Opyd, Ilene Qua, MD   Stopped at 06/22/16 0403  . hydrocortisone (ANUSOL-HC) 2.5 % rectal cream 1 application  1 application Topical QID PRN Opyd, Ilene Qua, MD   1 application at 29/51/88 0441  . LORazepam (ATIVAN) injection 0.5 mg  0.5 mg Intravenous Q6H PRN Opyd, Ilene Qua, MD      . MEDLINE  mouth rinse  15 mL Mouth Rinse q12n4p Opyd, Timothy S, MD      . morphine 2 MG/ML injection 1-3 mg  1-3 mg Intravenous Q2H PRN Opyd, Ilene Qua, MD   3 mg at 06/22/16 1331  . ondansetron (ZOFRAN) tablet 4 mg  4 mg Oral Q6H PRN Opyd, Ilene Qua, MD       Or  . ondansetron (ZOFRAN) injection 4 mg  4 mg Intravenous Q6H PRN Opyd, Ilene Qua, MD      . pantoprazole (PROTONIX) 80 mg in sodium chloride 0.9 % 250 mL (0.32 mg/mL) infusion  8 mg/hr Intravenous Continuous Drenda Freeze, MD 25 mL/hr at 06/22/16 1331 8 mg/hr at 06/22/16 1331  . [START ON 06/25/2016] pantoprazole (PROTONIX) injection 40 mg  40 mg Intravenous Q12H Drenda Freeze, MD      . prochlorperazine (COMPAZINE) tablet 10 mg  10 mg Oral Q6H PRN Opyd, Ilene Qua, MD      . sodium chloride flush (NS) 0.9 % injection 3 mL  3 mL Intravenous Q12H Opyd, Ilene Qua, MD   3 mL at 06/22/16 1103  . vitamin B-12 (CYANOCOBALAMIN) tablet 1,000 mcg  1,000 mcg Oral Daily Opyd, Ilene Qua, MD         Discharge Medications: Please see discharge summary for a list of discharge medications.  Relevant Imaging Results:  Relevant Lab Results:   Additional Information SS#: [id number]  Lilly Cove, LCSW

## 2016-06-22 NOTE — Progress Notes (Addendum)
[facility]  PROGRESS NOTE  Allison Haynes OXB:[id number] DOB: 07/06/1955 DOA: 07/02/2016 PCP: Allison Carol, MD   Brief HPI:   Allison Haynes is a quite pleasant but very unfortunate 61 y.o. female with medical history significant for ovarian cancer with omental metastasis and malignant ascites, now presenting to the emergency department from her nursing facility for evaluation of fatigue, weakness, melena, and critically low hemoglobin and outpatient blood work. Patient has been under the care of oncology and receiving chemotherapy, last infusion on 06/10/2016. Since chemotherapy patient will weakness and nonspecific malaise which got worse over past few days. In the ED patient was found to have hemoglobin of 4.7 and low platelet count. FOBT was positive. GI was consulted.    Subjective   This morning patient feels very weak. Denies pain. No nausea vomiting. Status post 4 units PRBC   Assessment/Plan:     1. Upper GI bleed with hemorrhagic shock- stable, she is status post 4 units PRBC. We will recheck CBC. Gastroenterology has been consulted and at this time patient has refused to undergo EGD.Continue Protonix infusion. 2. Anemia-multifactorial from chemotherapy, anemia of chronic disease, upper GI bleed. She status post 4 units PRBC. We will recheck CBC today. And transfuse as needed for hemoglobin less than 7. 3. Acute kidney injury with hyperkalemia-patient presented with BUN 126, creatinine 3.16 and serum potassium of 6.5. She was given IV fluid boluses in the ED, today creatinine is improved to 2.97. Potassium is 5.1 4. Pancytopenia- likely from chemotherapy. Patient presented with WBC 1.6, episode neutrophil count 1300. Will repeat CBC this morning. 5. Metastatic ovarian cancer, malignant ascites-patient is followed by oncology. Diuretics on hold due to hypotension. Once patient is more stable, will discuss palliative care consultation. 6. Hyponatremia-serum sodium was 118 on admission. This  morning sodium has improved to 126. Follow BMP in a.m.     DVT prophylaxis: SCDs  Code Status: DO NOT RESUSCITATE  Family Communication: No family present at bedside   Disposition Plan: Pending resolution of multiple medical problems   Consultants:  Gastroenterology  Procedures:  None  Continuous infusions . pantoprozole (PROTONIX) infusion 8 mg/hr (06/22/16 0600)      Antibiotics:   Anti-infectives    None       Objective   Vitals:   06/22/16 0745 06/22/16 0747 06/22/16 0800 06/22/16 1150  BP: (!) 99/48 (!) 99/48 (!) 104/51   Pulse: 94 91 86   Resp: 20 (!) 21 19   Temp:  (!) 96.8 F (36 C) 97.1 F (36.2 C) 98.6 F (37 C)  TempSrc:  Axillary Axillary Axillary  SpO2: 98% 100% 100%   Weight:      Height:        Intake/Output Summary (Last 24 hours) at 06/22/16 1215 Last data filed at 06/22/16 0745  Gross per 24 hour  Intake          1545.42 ml  Output                0 ml  Net          1545.42 ml   Filed Weights   06/22/2016 2132 06/22/16 0100  Weight: 70.3 kg (155 lb) 71 kg (156 lb 8 oz)     Physical Examination:   Physical Exam: Eyes: No icterus, extraocular muscles intact  Mouth: Oral mucosa is moist, no lesions on palate,  Neck: Supple, no deformities, masses, or tenderness Lungs: Normal respiratory effort, bilateral clear to auscultation, no crackles or wheezes.  Heart: Regular  rate and rhythm, S1 and S2 normal, no murmurs, rubs auscultated Abdomen: BS normoactive,soft,distended,non-tender to palpation,no organomegaly Extremities: No pretibial edema, no erythema, no cyanosis, no clubbing Neuro : Alert and oriented to time, place and person, No focal deficits  Skin: No rashes seen on exam     Data Reviewed: I have personally reviewed following labs and imaging studies  CBG: No results for input(s): GLUCAP in the last 168 hours.  CBC:  Recent Labs Lab 06/20/2016 2112  WBC 1.6*  NEUTROABS 1.3*  HGB 5.0*  HCT 15.1*  MCV 77.4*   PLT 19*    Basic Metabolic Panel:  Recent Labs Lab 06/20/2016 2112 06/22/16 0930  NA 118* 126*  K 6.5* 5.1  CL 85* 91*  CO2 14* 21*  GLUCOSE 95 101*  BUN 126* 135*  CREATININE 3.16* 2.97*  CALCIUM 7.7* 8.1*    Recent Results (from the past 240 hour(s))  MRSA PCR Screening     Status: None   Collection Time: 06/22/16  1:29 AM  Result Value Ref Range Status   MRSA by PCR NEGATIVE NEGATIVE Final    Comment:        The GeneXpert MRSA Assay (FDA approved for NASAL specimens only), is one component of a comprehensive MRSA colonization surveillance program. It is not intended to diagnose MRSA infection nor to guide or monitor treatment for MRSA infections.      Liver Function Tests:  Recent Labs Lab 06/13/2016 2112 06/22/16 0930  AST 17 19  ALT 10* 12*  ALKPHOS 62 57  BILITOT 1.6* 3.4*  PROT 5.3* 5.3*  ALBUMIN 1.9* 2.4*   No results for input(s): LIPASE, AMYLASE in the last 168 hours. No results for input(s): AMMONIA in the last 168 hours.  Cardiac Enzymes: No results for input(s): CKTOTAL, CKMB, CKMBINDEX, TROPONINI in the last 168 hours. BNP (last 3 results) No results for input(s): BNP in the last 8760 hours.  ProBNP (last 3 results) No results for input(s): PROBNP in the last 8760 hours.    Studies: No results found.  Scheduled Meds: . chlorhexidine  15 mL Mouth Rinse BID  . furosemide  40 mg Intravenous Once  . mouth rinse  15 mL Mouth Rinse q12n4p  . [START ON 06/25/2016] pantoprazole  40 mg Intravenous Q12H  . sodium chloride flush  3 mL Intravenous Q12H  . cyanocobalamin  1,000 mcg Oral Daily      Time spent: 25 min  Allison Haynes Pager [phone]. If 7PM-7AM, please contact night-coverage at [email or web address], Office  [phone]  password [email or web address] 06/22/2016, 12:15 PM  LOS: 1 day

## 2016-06-22 NOTE — Care Management Note (Signed)
Case Management Note  Patient Details  Name: Elisabella Hacker MRN: [id number] Date of Birth: 1956-01-01  Subjective/Objective:                  61 year old female with a history of ovarian cancer with omental metastases and malignant ascites. She is undergoing chemotherapy. She presented to the emergency department with complaints of fatigue, weakness, and melena. Her hemoglobin was found to be 4.7 and her platelet count was very low. We were consulted in regards to possible GI bleed from the upper GI tract.  Action/Plan: Date:  June 22, 2016 Lives at an assisted living facility may need SNF placement. Chart reviewed for concurrent status and case management needs.  Will continue to follow patient progress.  Discharge Planning: following for needs  Expected discharge date: [id number]  Velva Harman, BSN, [place], [place]   Expected Discharge Date:                  Expected Discharge Plan:  Assisted Living / Rest Home  In-House Referral:  Clinical Social Work  Discharge planning Services  CM Consult  Post Acute Care Choice:    Choice offered to:     DME Arranged:    DME Agency:     HH Arranged:    [place] Agency:     Status of Service:  In process, will continue to follow  If discussed at Long Length of Stay Meetings, dates discussed:    Additional Comments:  Leeroy Cha, RN 06/22/2016, 9:27 AM

## 2016-06-22 NOTE — Progress Notes (Signed)
Initial Nutrition Assessment  DOCUMENTATION CODES:   Not applicable  INTERVENTION:  - Diet advancement as medically feasible. - RD will provide appropriate interventions with diet advancement.  - Will document on findings related to malnutrition at follow-up.   NUTRITION DIAGNOSIS:   Inadequate oral intake related to inability to eat as evidenced by NPO status.  GOAL:   Patient will meet greater than or equal to 90% of their needs  MONITOR:   Diet advancement, Weight trends, Labs, I & O's  REASON FOR ASSESSMENT:   Malnutrition Screening Tool  ASSESSMENT:   61 y.o. female with medical history significant for ovarian cancer with omental metastasis and malignant ascites, now presenting to the emergency department from her nursing facility for evaluation of fatigue, weakness, melena, and critically low hemoglobin and outpatient blood work. Patient has been receiving chemotherapy, last infusion on 06/10/2016. She reports that shortly after her last infusion, she developed weakness and nonspecific malaise that was initially mild, but has progressed significantly. She reports chronic ongoing pain across the lower abdomen. She reports that her stools have been black for at least one week, but she had been attributing this to her iron supplementation. She notes mild nausea, but denies upper abdominal pain or vomiting.   Pt seen for MST. BMI indicates overweight status (may be d/t ascites). She has been NPO since admission. Pt sleeping soundly at this time and did not arouse to name call x1. No family/visitors present. Spoke with RN who reports that pt has been sleeping most of the day. Pt was having severe abdominal pain and was given morphine upon request and has wanted to sleep because of severity of pain. Will talk with pt at follow-up rather than awaking her at this time d/t experience of pain when awake. Doc flowsheet indicates pt has been a/o x2 (self, situation). RN reports that pt did  not want to swallow vitamin B12 pill when it was due this AM.   Will do nutrition-focused physical assessment at follow-up and document findings at that time. Per review, pt weighed 194 lbs on 04/27/16 which, based on current weight, indicates 38 lb weight loss (20% weight loss) in 2 months which is significant. Will ask pt more about weight fluctuations at follow-up. Pt was seen by an RD on 4/23 and follow-up with on 4/30 and was diagnosed with moderate/non-severe malnutrition at that time. Will document findings of malnutrition at this time when more information is available at follow-up.  Medications reviewed; 50 mg albumin x1 dose yesterday, 40 mg IV Lasix x1 dose today, 10 units Novolog x1 dose yesterday, PRN IV morphine, 80 mg IV Protonix x1 dose yesterday, 8 mg/hr IV Protonix x72 hours, 40 mg IV Protonix BID, 1 mg IV vitamin K x1 dose yesterday, 1000 mcg oral vitamin B12/day.  Labs reviewed; Na: 126 mmol/L, Cl: 91 mmol/L, BUN: 135 mg/dL, creatinine: 2.97 mg/dL, Ca: 8.1 mg/dL, GFR: 16 mL/min.    Diet Order:  Diet NPO time specified Except for: Ice Chips, Sips with Meds  Skin:  Reviewed, no issues  Last BM:  PYA/unknown  Height:   Ht Readings from Last 1 Encounters:  06/22/16 5\' 5"  (1.651 m)    Weight:   Wt Readings from Last 1 Encounters:  06/22/16 156 lb 8 oz (71 kg)    Ideal Body Weight:  56.82 kg  BMI:  Body mass index is 26.04 kg/m.  Estimated Nutritional Needs:   Kcal:  2025-4270 (30-35 kcal/kg)  Protein:  100-115 grams (1.4-1.6 grams/kg)  Fluid:  per MD given persistent, malignant ascites  EDUCATION NEEDS:   No education needs identified at this time    Jarome Matin, MS, RD, LDN, [facility] Inpatient Clinical Dietitian Pager # [phone] After hours/weekend pager # [phone]

## 2016-06-22 NOTE — Progress Notes (Signed)
Notified ED RN in providing care for patient in ED and also [facility] in regards to patient's status and entire plan of care. Receiving mixed messages in regards to plan. Called MD Opyd for clarification in regards to plan. MD Opyd ordered to follow his note. Patient needs blood transfusions.

## 2016-06-22 NOTE — Clinical Social Work Note (Addendum)
Clinical Social Work Assessment  Patient Details  Name: Allison Haynes MRN: [id number] Date of Birth: [birth year]  Date of referral:  06/22/16               Reason for consult:  Discharge Planning                Permission sought to share information with:  Case Manager, Facility Sport and exercise psychologist Permission granted to share information::  Yes, Verbal Permission Granted  Name::        Agency::  [facility]   Relationship::     Contact Information:     Housing/Transportation Living arrangements for the past 2 months:  [place] of Information:  Patient, Medical Team, Tourist information centre manager, Facility Patient Interpreter Needed:  None Criminal Activity/Legal Involvement Pertinent to Current Situation/Hospitalization:  No - Comment as needed Significant Relationships:  Other Family Members  Daughter Lives with:  Facility Resident Do you feel safe going back to the place where you live?  Yes Need for family participation in patient care:  No (Coment)  Care giving concerns:  Patient is coming from recent placement from [facility] hospital at [facility] where she is pay privately until her medicaid is approved.  Patient was approved for  LOG per notes/AD from recent discharge and per admissions at [facility] is returning under private pay.  Patient is long term care.  Family involved is a daughter per notes. No family at bedside.  Plan will be for patient to return at discharge to [facility].    Hx of patient per MEDICAL RECORD NUMBER Pt dtr very concerned about patient disposition- states that patient was living in a condemned house in [place], [place] where the patient has been living without electricity or running water for over 2 years and mold covering most of the house.  Patient has family here in [place] but her sister (who has a house here) is already a caregiver for her own husband who has lung cancer and pts elderly mother who is wheelchair bound.  Dtr lives in [place], [place]  and would also be unable to take patient home at [place].  Medicaid app has been started and just awaiting approval per billing at [facility]. No barriers with return.   Social Worker assessment / plan:  LCSW consulted for discharge planning. Patient will return to LTC at [facility]  Spoke with Adela Lank who is in agreement. Under private pay.  FL2 has been updated and facility is aware of admission. Daughter called: Delsa Sale and message left regarding any concerns and updates.   Plan: nursing home at discharge.  Employment status:  Disabled (Comment on whether or not currently receiving Disability) Insurance information:  Self Pay (Medicaid Pending) (self pay at the facility, was on a LOG in the previous months) PT Recommendations:  Not assessed at this time Information / Referral to community resources:     None currently.  Patient/Family's Response to care:  Understanding and agreeable  Patient/Family's Understanding of and Emotional Response to Diagnosis, Current Treatment, and Prognosis:  Understanding of admission and treatment.  Call placed to daughter: Solae Norling, message regarding admission and questions.  Emotional Assessment Appearance:  Appears stated age Attitude/Demeanor/Rapport:    Affect (typically observed):  Accepting, Adaptable Orientation:  Oriented to Self, Oriented to Place, Oriented to  Time, Oriented to Situation Alcohol / Substance use:  Not Applicable Psych involvement (Current and /or in the community):  No (Comment)  Discharge Needs  Concerns to be  addressed:  No discharge needs identified Readmission within the last 30 days:  No Current discharge risk:  None Barriers to Discharge:  No Barriers Identified   Lilly Cove, LCSW 06/22/2016, 1:28 PM

## 2016-06-22 NOTE — Consult Note (Signed)
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 She is undergoing chemotherapy. She presented to the emergency department with complaints of fatigue, weakness, and melena. Her hemoglobin was found to be 4.7 and her platelet count was very low. We were consulted in regards to possible GI bleed from the upper GI tract.     Past Medical History:  Diagnosis Date  . Ovarian cancer ([place]) 04/25/2016   Past Surgical History:  Procedure Laterality Date  . IR PARACENTESIS  04/28/2016   Social History   Social History  . Marital status: Widowed    Spouse name: N/A  . Number of children: N/A  . Years of education: N/A   Occupational History  . Not on file.   Social History Main Topics  . Smoking status: Never Smoker  . Smokeless tobacco: Never Used  . Alcohol use No  . Drug use: No  . Sexual activity: Not on file   Other Topics Concern  . Not on file   Social History Narrative  . No narrative on file   family history includes Breast cancer in her maternal aunt and paternal aunt; Diabetes Mellitus II in her father and mother; Hypertension in her father and mother.  Current Facility-Administered Medications:  .  acetaminophen (TYLENOL) tablet 650 mg, 650 mg, Oral, Q6H PRN **OR** acetaminophen (TYLENOL) suppository 650 mg, 650 mg, Rectal, Q6H PRN, Opyd, Timothy S, MD .  chlorhexidine (PERIDEX) 0.12 % solution 15 mL, 15 mL, Mouth Rinse, BID, Opyd, Ilene Qua, MD, 15 mL at 06/22/16 0208 .  furosemide (LASIX) injection 40 mg, 40 mg, Intravenous, Once, Opyd, Ilene Qua, MD, Stopped at 06/22/16 0403 .  hydrocortisone (ANUSOL-HC) 2.5 % rectal cream 1 application, 1 application, Topical, QID PRN, Opyd, Ilene Qua, MD, 1 application at 96/29/52 0441 .  LORazepam (ATIVAN) injection 0.5 mg, 0.5 mg, Intravenous, Q6H PRN, Opyd, Timothy S, MD .  MEDLINE mouth rinse, 15 mL, Mouth Rinse, q12n4p, Opyd, Timothy S, MD .   morphine 2 MG/ML injection 1-3 mg, 1-3 mg, Intravenous, Q2H PRN, Opyd, Ilene Qua, MD, 3 mg at 06/22/16 0821 .  ondansetron (ZOFRAN) tablet 4 mg, 4 mg, Oral, Q6H PRN **OR** ondansetron (ZOFRAN) injection 4 mg, 4 mg, Intravenous, Q6H PRN, Opyd, Ilene Qua, MD .  pantoprazole (PROTONIX) 80 mg in sodium chloride 0.9 % 250 mL (0.32 mg/mL) infusion, 8 mg/hr, Intravenous, Continuous, Drenda Freeze, MD, Last Rate: 25 mL/hr at 06/22/16 0600, 8 mg/hr at 06/22/16 0600 .  [START ON 06/25/2016] pantoprazole (PROTONIX) injection 40 mg, 40 mg, Intravenous, Q12H, Drenda Freeze, MD .  prochlorperazine (COMPAZINE) tablet 10 mg, 10 mg, Oral, Q6H PRN, Opyd, Timothy S, MD .  sodium chloride flush (NS) 0.9 % injection 3 mL, 3 mL, Intravenous, Q12H, Opyd, Ilene Qua, MD, 3 mL at 06/22/16 0006 .  vitamin B-12 (CYANOCOBALAMIN) tablet 1,000 mcg, 1,000 mcg, Oral, Daily, Opyd, Ilene Qua, MD Allergies  Allergen Reactions  . Pseudoephedrine Other (See Comments)    "makes me loopy"     Objective:     BP (!) 104/51   Pulse 86   Temp 97.1 F (36.2 C) (Axillary)   Resp 19   Ht 5\' 5"  (1.651 m)   Wt 71 kg (156 lb 8 oz)   SpO2 100%   BMI 26.04 kg/m   She is alert and able to communicate well.  Heart regular rhythm  Lungs clear  Abdomen: Markedly  distended and firm with ascites  Laboratory No components found for: D1    Assessment:     #1. Metastatic ovarian cancer  #2. Anemia  #3. Melena      Plan:     The patient has received 3 units of packed red cells and platelets. I suspect that the significant anemia is related to a combination of her overall disease, chemotherapy, and upper GI bleed. We discussed EGD, but she is not interested. I would not recommend pursuing EGD unless immediate intervention for upper GI bleed was necessary. Continue to transfuse as needed. PPI therapy. Lab Results  Component Value Date   HGB 5.0 (LL) 06/07/2016   HGB 8.6 (L) 06/10/2016   HGB 8.5 (L) 05/19/2016    HGB 7.0 (L) 05/14/2016   HGB 8.2 (L) 05/04/2016   HCT 15.1 (L) 06/10/2016   HCT 27.2 (L) 06/10/2016   HCT 29.1 (L) 05/19/2016   HCT 24.8 (L) 05/14/2016   HCT 28.0 (L) 05/04/2016   ALKPHOS 62 06/22/2016   ALKPHOS 109 06/10/2016   ALKPHOS 98 05/19/2016   ALKPHOS 91 05/14/2016   ALKPHOS 84 05/02/2016   AST 17 06/29/2016   AST 9 06/10/2016   AST 18 05/19/2016   AST 19 05/14/2016   AST 26 05/02/2016   ALT 10 (L) 06/17/2016   ALT <6 06/10/2016   ALT <6 05/19/2016   ALT 9 (L) 05/14/2016   ALT 7 (L) 05/02/2016

## 2016-06-23 DIAGNOSIS — R578 Other shock: Secondary | ICD-10-CM

## 2016-06-23 DIAGNOSIS — R180 Malignant ascites: Secondary | ICD-10-CM

## 2016-06-23 DIAGNOSIS — Z7189 Other specified counseling: Secondary | ICD-10-CM

## 2016-06-23 DIAGNOSIS — K922 Gastrointestinal hemorrhage, unspecified: Secondary | ICD-10-CM

## 2016-06-23 LAB — TYPE AND SCREEN
ABO/RH(D): O NEG
Antibody Screen: NEGATIVE
Unit division: 0
Unit division: 0
Unit division: 0

## 2016-06-23 LAB — BPAM RBC
BLOOD PRODUCT EXPIRATION DATE: 201807132359
Blood Product Expiration Date: 201807132359
Blood Product Expiration Date: 201807132359
ISSUE DATE / TIME: 201806172229
ISSUE DATE / TIME: 201806180123
ISSUE DATE / TIME: 201806180340
UNIT TYPE AND RH: 9500
Unit Type and Rh: 9500
Unit Type and Rh: 9500

## 2016-06-23 LAB — PREPARE PLATELET PHERESIS: UNIT DIVISION: 0

## 2016-06-23 LAB — BPAM PLATELET PHERESIS
Blood Product Expiration Date: 201806212359
ISSUE DATE / TIME: 201806180534
Unit Type and Rh: 6200

## 2016-06-29 ENCOUNTER — Ambulatory Visit (HOSPITAL_COMMUNITY): Payer: MEDICAID

## 2016-06-29 ENCOUNTER — Other Ambulatory Visit: Payer: Self-pay

## 2016-06-30 ENCOUNTER — Ambulatory Visit: Payer: Self-pay

## 2016-06-30 ENCOUNTER — Ambulatory Visit: Payer: Self-pay | Admitting: Hematology and Oncology

## 2016-07-01 ENCOUNTER — Ambulatory Visit: Payer: Self-pay

## 2016-07-05 NOTE — Progress Notes (Signed)
Pt expired at 0001, pronounced by 2 RNs. Pt was a DNR and was made comfort care on July 21, 2022. Death was expected. Death certificate completed and given to RN.  KJKG, NP [facility]

## 2016-07-05 NOTE — Progress Notes (Signed)
0001 Pt asystole on tele monitor. No noted heart tones or breath sounds on auscultation. Verified with second RN Loyal Gambler. Family at bedside. [facility] notified and MD notified.

## 2016-07-05 DEATH — deceased

## 2016-07-11 ENCOUNTER — Other Ambulatory Visit: Payer: Self-pay | Admitting: Nurse Practitioner

## 2016-08-05 NOTE — Discharge Summary (Signed)
Death Summary  Allison Haynes ZTI:[id number] DOB: 1955-10-06 DOA: 07/09/16  PCP: Seward Carol, MD  Admit date: July 09, 2016 Date of Death: 08-09-2016  Final Diagnoses:  Principal Problem:   Hemorrhagic shock ([place]) Active Problems:   Ovarian cancer ([place])   Ascites   Microcytic anemia   Hyponatremia   Severe malnutrition ([facility])   Omental metastasis ([facility])   Acute GI bleeding   Acute renal failure (ARF) ([facility])   Hyperkalemia   Pancytopenia ([place])   DNR (do not resuscitate) discussion   Coagulopathy ([place])   Pancytopenia Hemorrhagic shock Metastatic ovarian cancer Malignant ascites Hyponatremia Acute kidney injury   History of present illness:  Allison Haynes pleasant but very unfortunate 61 y.o.femalewith medical history significant for ovarian cancer with omental metastasis and malignant ascites, now presenting to the emergency department from her nursing facility for evaluation of fatigue, weakness, melena, and critically low hemoglobin and outpatient blood work. Patient has been under the care of oncology and receiving chemotherapy, last infusion on 06/10/2016. Since chemotherapy patient had  weakness and nonspecific malaise which got worse over past few days. In the ED patient was found to have hemoglobin of 4.7 and low platelet count. FOBT was positive. GI was consulted.   Hospital Course:  1. Upper GI bleed with hemorrhagic shock- stable, she is status post 4 units PRBC. Gastroenterology was consulted  patient refused to undergo EGD. 2. Anemia-multifactorial from chemotherapy, anemia of chronic disease, upper GI bleed. She was transfused  4 units PRBC.  3. Acute kidney injury with hyperkalemia-patient presented with BUN 126, creatinine 3.16 and serum potassium of 6.5. She was given IV fluid boluses in the ED,  creatinine is improved to 2.97. Potassium is 5.1 4. Pancytopenia- likely from chemotherapy. Patient presented with WBC 1.6, absolute neutrophil count 1300. 5. Metastatic  ovarian cancer, malignant ascites-patient is followed by oncology. Diuretics were held due to hypotension.  6. Hyponatremia-serum sodium was 118 on admission. Sodium improved to 126.  Called by RN that patient blood pressure dropped to 73/39, and has become more lethargic. Spoke to patient's daughter on phone, and discussed patient's worsening condition. Earlier GI was consulted but patient refused EGD.   Patient had discussed with admitting physician that she did not want heroic measures including mechanical ventilation, CPR, defibrillation or ACLS medications.  Discussed the futility of pressor support at this time, considering patient may have active bleeding going on, and without intervention any aggressive pressor support will not help the patients condition.  Patient was too sick  at this time to undergo intervention, with worsening BUN/creatinine 136/3.13, WBC  dropped to 1.0, platelets 14,000 and considering patient's wish not to undergo EGD when she was alert and awake, patient's daughter agreed with comfort measures only. She was started on  aggressive IV hydration with normal saline at 125 ml/hr as per daughter's wish to give her some time to reach [facility] as she was  traveling from [place].  Patient expired at 0001  Time: 0001  Signed:  Mamoudou Mulvehill S  [facility] 09-Aug-2016, 10:11 AM

## 2017-11-26 IMAGING — US IR PARACENTESIS
1 series · 3 of 3 positions shown · non-contrast
Comparison: none

INDICATION: Patient with ascites. Request is made for diagnostic and therapeutic
paracentesis.

[Series 1: ir (id) (id)/(id)/(id) ir · 3 of 3 slices shown]
[im 1/3]
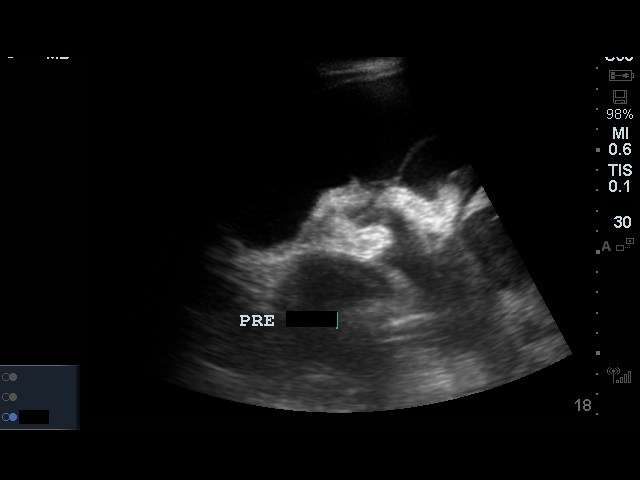
[im 2/3]
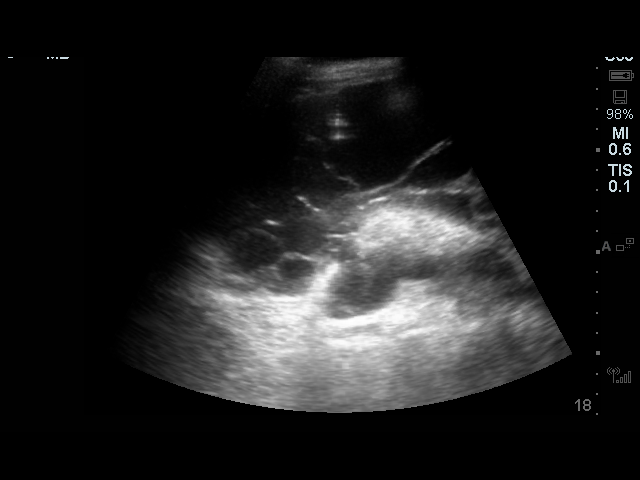
[im 3/3]
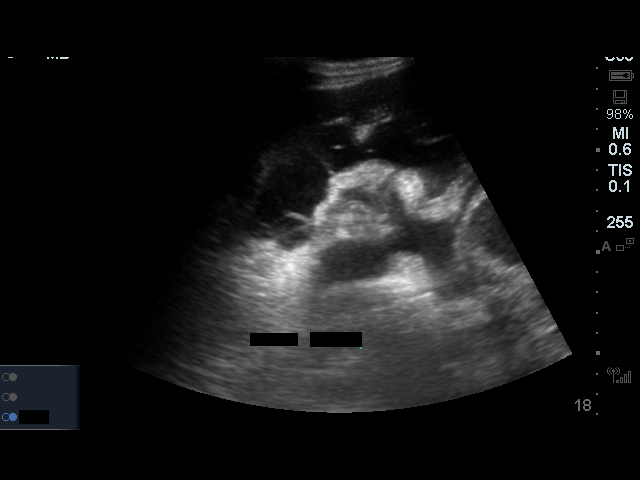

[3 of 3 positions shown; findings below may reference images not displayed]

EXAM:
ULTRASOUND GUIDED DIAGNOSTIC AND THERAPEUTIC PARACENTESIS

MEDICATIONS:
10 mL 1% lidocaine

COMPLICATIONS:
None immediate.

PROCEDURE:
Informed written consent was obtained from the patient after a
discussion of the risks, benefits and alternatives to treatment. A
timeout was performed prior to the initiation of the procedure.

Initial ultrasound scanning demonstrates a large amount of ascites
within the right lateral abdomen. The right lateral abdomen was
prepped and draped in the usual sterile fashion. 1% lidocaine was
used for local anesthesia.

Following this, a 19 gauge, 7-cm, Yueh catheter was introduced. An
ultrasound image was saved for documentation purposes. The
paracentesis was performed. The catheter was removed and a dressing
was applied. The patient tolerated the procedure well without
immediate post procedural complication.
FINDINGS: A total of approximately 530 mL of thick, amber fluid was removed.
Samples were sent to the laboratory as requested by the clinical
team.
IMPRESSION: Successful ultrasound-guided diagnostic and therapeutic paracentesis
yielding 530 liters of peritoneal fluid.

## 2017-11-29 IMAGING — CT CT BIOPSY
1 of 2 series · 12 of 16 positions shown, 15 images · non-contrast
Comparison: none

INDICATION: LARGE ABDOMINOPELVIC COMPLEX CYSTIC MASS INVOLVING THE OMENTUM

[Series 2: i-spiral 5.0 b40f · axial · 0.94mm/px · z∈[-441,-178]mm · 12 of 89 slices shown, 15 images]
[im 7/89  soft-tissue]
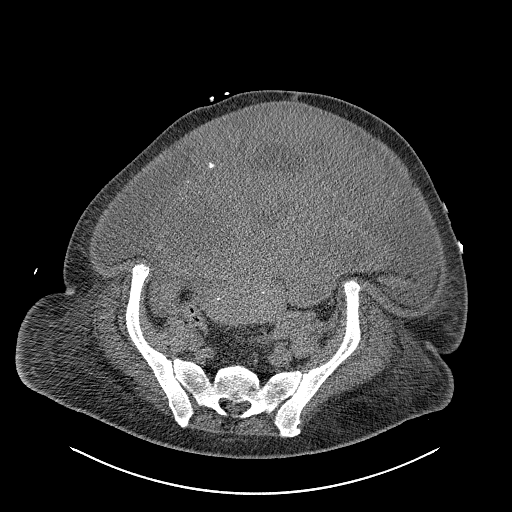
[im 7/89  bone]
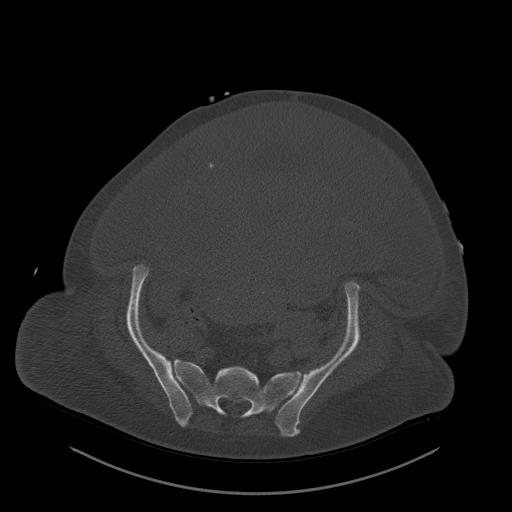
[im 14/89  soft-tissue]
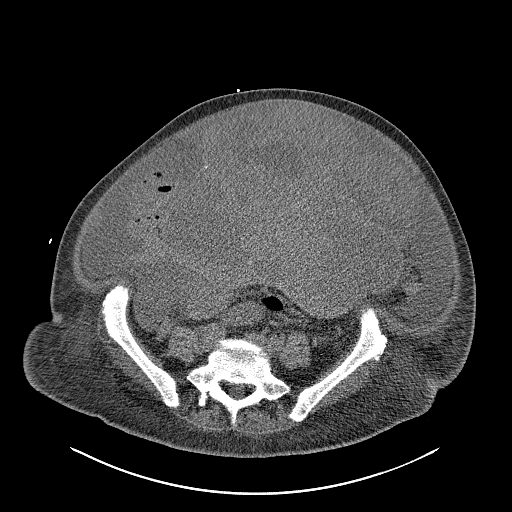
[im 21/89  soft-tissue]
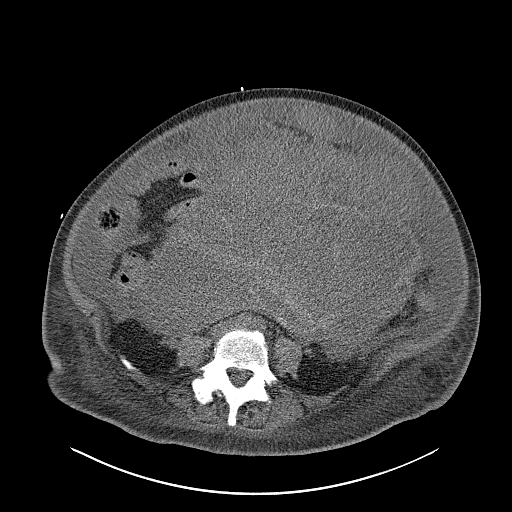
[im 28/89  soft-tissue]
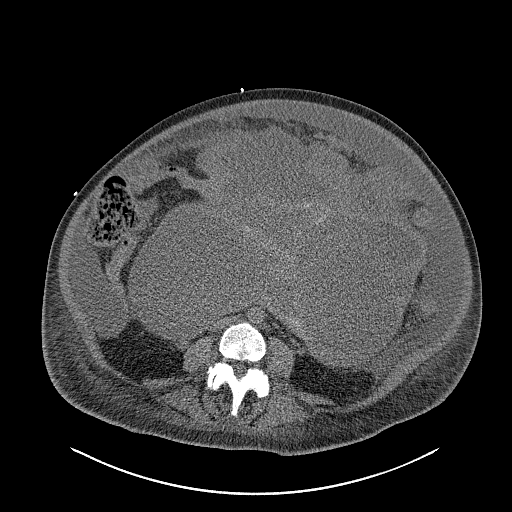
[im 34/89  soft-tissue]
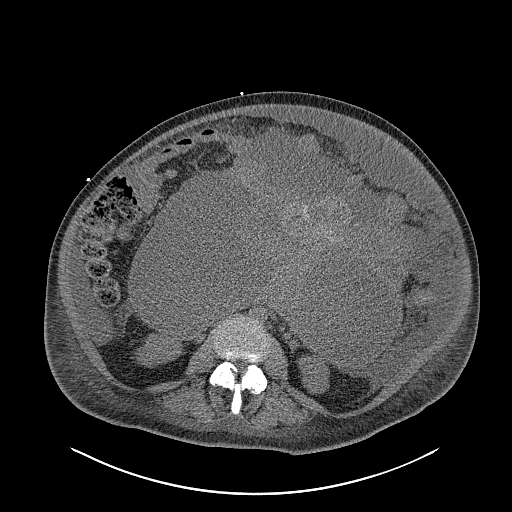
[im 34/89  bone]
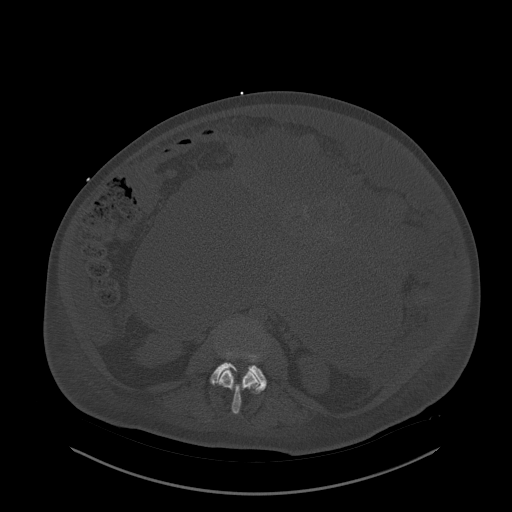
[im 41/89  soft-tissue]
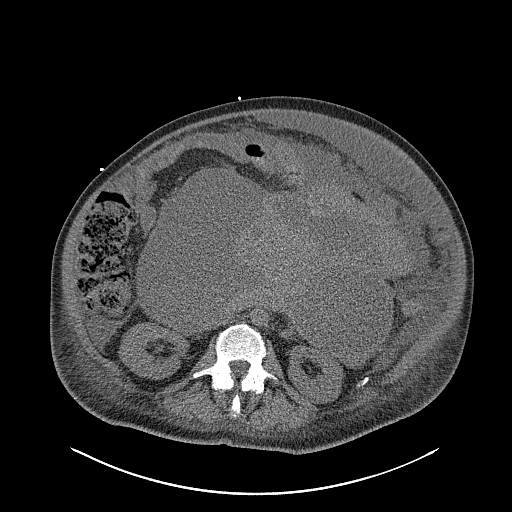
[im 48/89  soft-tissue]
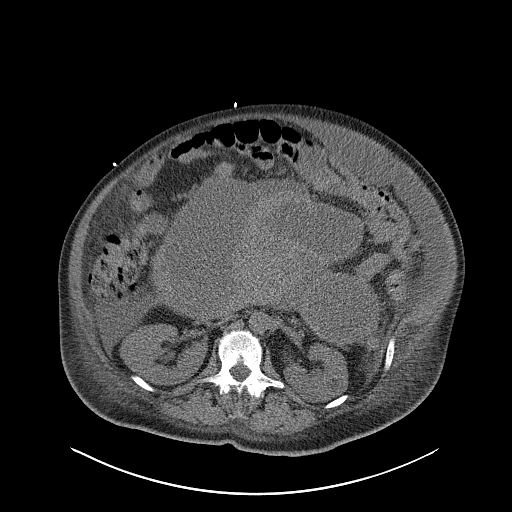
[im 55/89  soft-tissue]
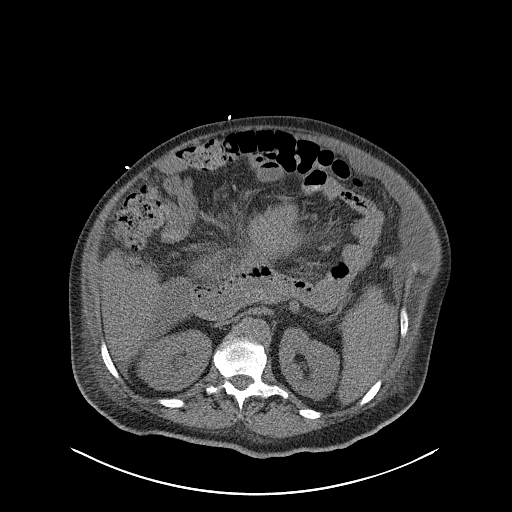
[im 61/89  soft-tissue]
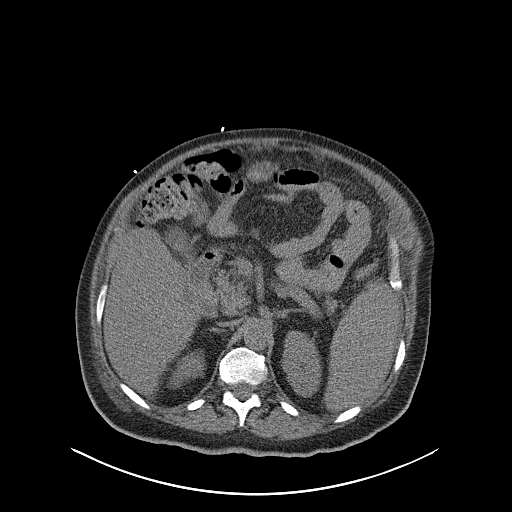
[im 61/89  bone]
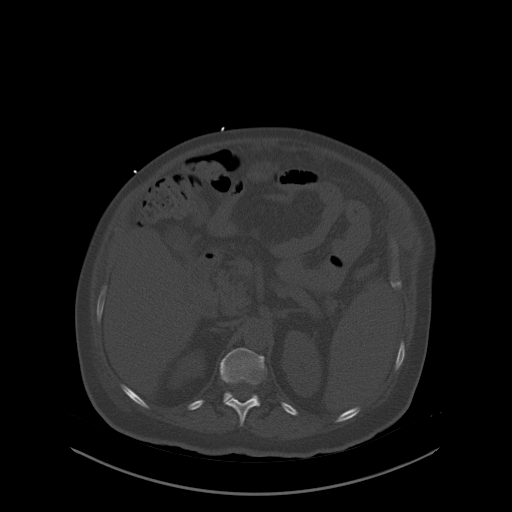
[im 68/89  soft-tissue]
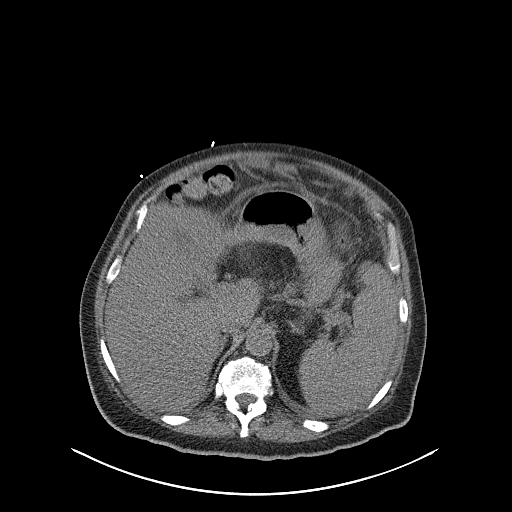
[im 75/89  soft-tissue]
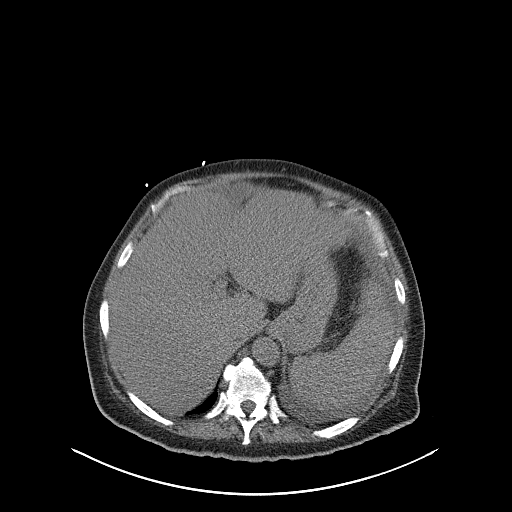
[im 82/89  soft-tissue]
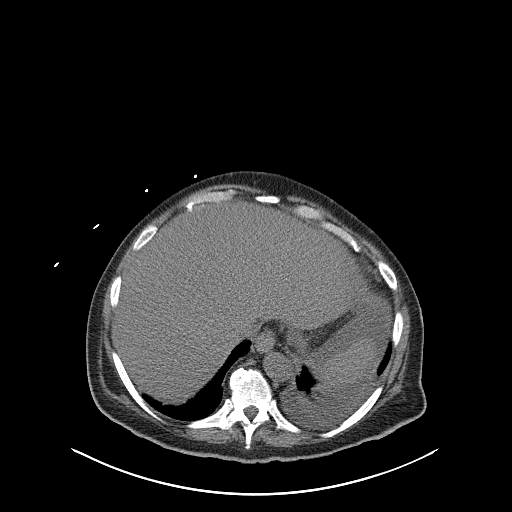

[12 of 16 positions shown; findings below may reference images not displayed]

EXAM:
CT-GUIDED BIOPSY ANTERIOR OMENTAL MASS

MEDICATIONS:
1% LIDOCAINE LOCALLY

ANESTHESIA/SEDATION:
1.0 mg IV Versed; 25 mcg IV Fentanyl

Moderate Sedation Time:  13 minutes

The patient was continuously monitored during the procedure by the
interventional radiology nurse under my direct supervision.

PROCEDURE:
The procedure, risks, benefits, and alternatives were explained to
the patient. Questions regarding the procedure were encouraged and
answered. The patient understands and consents to the procedure.

Previous imaging reviewed. Patient positioned supine. Noncontrast
localization CT performed. The large abdominopelvic mass involving
the omentum was localized. Omental component was marked in the
anterior abdomen just above the umbilicus.

Under sterile conditions and local anesthesia, a 17 gauge coaxial
guide needle was advanced into the anterior omental mass. Needle
position confirmed with CT. 18 gauge core biopsies obtained. These
were placed in formalin.

Patient tolerated the procedure well without complication. Vital
sign monitoring by nursing staff during the procedure will continue
as patient is in the special procedures unit for post procedure
observation.
FINDINGS: The images document guide needle placement within the anterior
omental mass. Post biopsy images demonstrate no hemorrhage or
hematoma.

COMPLICATIONS:
None immediate.
IMPRESSION: Successful CT-guided anterior omental mass 18 gauge core biopsy
